# Patient Record
Sex: Female | Born: 1955
Health system: Southern US, Community
[De-identification: ages and names within clinical notes are randomized; demographics above are authoritative.]

## PROBLEM LIST (undated history)

## (undated) DIAGNOSIS — K219 Gastro-esophageal reflux disease without esophagitis: Secondary | ICD-10-CM

## (undated) DIAGNOSIS — R002 Palpitations: Secondary | ICD-10-CM

## (undated) DIAGNOSIS — I509 Heart failure, unspecified: Secondary | ICD-10-CM

## (undated) DIAGNOSIS — M810 Age-related osteoporosis without current pathological fracture: Secondary | ICD-10-CM

## (undated) HISTORY — DX: Gastro-esophageal reflux disease without esophagitis: K21.9

## (undated) HISTORY — DX: Palpitations: R00.2

## (undated) HISTORY — PX: VAGINAL HYSTERECTOMY: SUR661

## (undated) HISTORY — DX: Age-related osteoporosis without current pathological fracture: M81.0

---

## 1999-08-04 ENCOUNTER — Other Ambulatory Visit: Admission: RE | Admit: 1999-08-04 | Discharge: 1999-08-04 | Payer: Self-pay | Admitting: Gynecology

## 2000-07-12 ENCOUNTER — Other Ambulatory Visit: Admission: RE | Admit: 2000-07-12 | Discharge: 2000-07-12 | Payer: Self-pay | Admitting: Gynecology

## 2001-03-06 ENCOUNTER — Encounter: Payer: Self-pay | Admitting: Gynecology

## 2001-03-06 ENCOUNTER — Encounter: Admission: RE | Admit: 2001-03-06 | Discharge: 2001-03-06 | Payer: Self-pay | Admitting: Gynecology

## 2002-10-17 ENCOUNTER — Other Ambulatory Visit: Admission: RE | Admit: 2002-10-17 | Discharge: 2002-10-17 | Payer: Self-pay | Admitting: Gynecology

## 2004-03-30 ENCOUNTER — Other Ambulatory Visit: Admission: RE | Admit: 2004-03-30 | Discharge: 2004-03-30 | Payer: Self-pay | Admitting: Gynecology

## 2005-08-02 ENCOUNTER — Other Ambulatory Visit: Admission: RE | Admit: 2005-08-02 | Discharge: 2005-08-02 | Payer: Self-pay | Admitting: Gynecology

## 2008-08-01 ENCOUNTER — Encounter: Admission: RE | Admit: 2008-08-01 | Discharge: 2008-08-01 | Payer: Self-pay | Admitting: Gynecology

## 2008-08-09 ENCOUNTER — Encounter: Admission: RE | Admit: 2008-08-09 | Discharge: 2008-08-09 | Payer: Self-pay | Admitting: Gastroenterology

## 2010-02-16 ENCOUNTER — Encounter: Admission: RE | Admit: 2010-02-16 | Discharge: 2010-02-16 | Payer: Self-pay | Admitting: Gynecology

## 2012-01-13 ENCOUNTER — Other Ambulatory Visit: Payer: Self-pay | Admitting: Gynecology

## 2012-01-13 DIAGNOSIS — Z1231 Encounter for screening mammogram for malignant neoplasm of breast: Secondary | ICD-10-CM

## 2012-02-14 ENCOUNTER — Other Ambulatory Visit: Payer: Self-pay | Admitting: Gynecology

## 2012-02-14 ENCOUNTER — Ambulatory Visit
Admission: RE | Admit: 2012-02-14 | Discharge: 2012-02-14 | Disposition: A | Discharge status: Home / Self Care | Payer: Managed Care, Other (non HMO) | Source: Ambulatory Visit | Attending: Gynecology | Admitting: Gynecology

## 2012-02-14 DIAGNOSIS — M810 Age-related osteoporosis without current pathological fracture: Secondary | ICD-10-CM

## 2012-02-14 DIAGNOSIS — Z1231 Encounter for screening mammogram for malignant neoplasm of breast: Secondary | ICD-10-CM

## 2013-01-19 ENCOUNTER — Other Ambulatory Visit: Payer: Self-pay

## 2013-01-19 DIAGNOSIS — Z1231 Encounter for screening mammogram for malignant neoplasm of breast: Secondary | ICD-10-CM

## 2013-02-16 ENCOUNTER — Ambulatory Visit
Admission: RE | Admit: 2013-02-16 | Discharge: 2013-02-16 | Disposition: A | Discharge status: Home / Self Care | Payer: Managed Care, Other (non HMO) | Source: Ambulatory Visit

## 2013-02-16 DIAGNOSIS — Z1231 Encounter for screening mammogram for malignant neoplasm of breast: Secondary | ICD-10-CM

## 2013-05-17 ENCOUNTER — Telehealth: Payer: Self-pay | Admitting: Cardiology

## 2013-05-17 NOTE — Telephone Encounter (Signed)
ROI faxed to [facility] [phone]

## 2013-05-18 ENCOUNTER — Telehealth: Payer: Self-pay | Admitting: Cardiology

## 2013-05-18 NOTE — Telephone Encounter (Signed)
Letter Head received back From [facility] No records On This Pt 2.6.15/kdm

## 2013-06-05 ENCOUNTER — Ambulatory Visit: Payer: Managed Care, Other (non HMO) | Admitting: Cardiology

## 2013-07-03 ENCOUNTER — Encounter: Payer: Self-pay | Admitting: *Deleted

## 2013-07-03 ENCOUNTER — Ambulatory Visit (INDEPENDENT_AMBULATORY_CARE_PROVIDER_SITE_OTHER): Payer: Managed Care, Other (non HMO) | Admitting: Cardiology

## 2013-07-03 ENCOUNTER — Encounter: Payer: Self-pay | Admitting: Cardiology

## 2013-07-03 ENCOUNTER — Encounter (INDEPENDENT_AMBULATORY_CARE_PROVIDER_SITE_OTHER): Payer: Managed Care, Other (non HMO)

## 2013-07-03 VITALS — BP 156/83 | Ht 65.0 in | Wt 142.0 lb

## 2013-07-03 DIAGNOSIS — E041 Nontoxic single thyroid nodule: Secondary | ICD-10-CM

## 2013-07-03 DIAGNOSIS — R002 Palpitations: Secondary | ICD-10-CM

## 2013-07-03 DIAGNOSIS — R42 Dizziness and giddiness: Secondary | ICD-10-CM

## 2013-07-03 DIAGNOSIS — I10 Essential (primary) hypertension: Secondary | ICD-10-CM

## 2013-07-03 DIAGNOSIS — G4730 Sleep apnea, unspecified: Secondary | ICD-10-CM

## 2013-07-03 LAB — CBC WITH DIFFERENTIAL/PLATELET
Basophils Absolute: 0 10*3/uL (ref 0.0–0.1)
Basophils Relative: 0.4 % (ref 0.0–3.0)
Eosinophils Absolute: 0.1 10*3/uL (ref 0.0–0.7)
Eosinophils Relative: 1.2 % (ref 0.0–5.0)
HCT: 37.2 % (ref 36.0–46.0)
Hemoglobin: 12.4 g/dL (ref 12.0–15.0)
Lymphocytes Relative: 34.3 % (ref 12.0–46.0)
Lymphs Abs: 2.1 10*3/uL (ref 0.7–4.0)
MCHC: 33.3 g/dL (ref 30.0–36.0)
MCV: 93.6 fl (ref 78.0–100.0)
Monocytes Absolute: 0.4 10*3/uL (ref 0.1–1.0)
Monocytes Relative: 6.8 % (ref 3.0–12.0)
Neutro Abs: 3.5 10*3/uL (ref 1.4–7.7)
Neutrophils Relative %: 57.3 % (ref 43.0–77.0)
Platelets: 263 10*3/uL (ref 150.0–400.0)
RBC: 3.97 Mil/uL (ref 3.87–5.11)
RDW: 13 % (ref 11.5–14.6)
WBC: 6.2 10*3/uL (ref 4.5–10.5)

## 2013-07-03 LAB — COMPREHENSIVE METABOLIC PANEL
ALT: 14 U/L (ref 0–35)
AST: 16 U/L (ref 0–37)
Albumin: 3.9 g/dL (ref 3.5–5.2)
Alkaline Phosphatase: 57 U/L (ref 39–117)
BUN: 16 mg/dL (ref 6–23)
CO2: 31 mEq/L (ref 19–32)
Calcium: 9.1 mg/dL (ref 8.4–10.5)
Chloride: 103 mEq/L (ref 96–112)
Creatinine, Ser: 0.9 mg/dL (ref 0.4–1.2)
GFR: 72.72 mL/min (ref >60.00)
Glucose, Bld: 93 mg/dL (ref 70–99)
Potassium: 4.4 mEq/L (ref 3.5–5.1)
Sodium: 139 mEq/L (ref 135–145)
Total Bilirubin: 0.6 mg/dL (ref 0.3–1.2)
Total Protein: 7.6 g/dL (ref 6.0–8.3)

## 2013-07-03 LAB — TSH: TSH: 0.81 u[IU]/mL (ref 0.35–5.50)

## 2013-07-03 NOTE — Progress Notes (Signed)
Patient ID: [name], female   DOB: Mar 28, 1956, 58 y.o.   MRN: [id number]    Patient Name: [name] Date of Encounter: 07/03/2013  Primary Care Provider:  No primary provider on file. Primary Cardiologist:  Lars Masson  Problem List   Past Medical History  Diagnosis Date  . Osteoporosis    Past Surgical History  Procedure Laterality Date  . Vaginal hysterectomy     Allergies  Allergies  Allergen Reactions  . Cefdinir    HPI  58 year old female with h/o gastric reflux and palpitations and significant family history of coronary artery disease was coming with concerns of shortness of breath and palpitations. The patient states that her palpitations started years ago and she underwent Holter monitoring that showed frequent arrhythmia that she cannot specify further but at the time it was recommended that no treatment is necessary. She states that over time her palpitations got worse and are happening almost daily especially when laying in bed and on her left side makes her feel short of breath with improvement when laying on her back. The patient states that one she stopped eating chocolate and fiber bars her symptoms have improved but are still persistent. She states that they last seconds to minutes and are not associated with dizziness.  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 She walks on a regular bases with her husband up to 30 minutes a day however she states at moderate exertion she gets short of breath. She states that that's the way it has been for her and she was never able to run or do any moderate exercise activity.  She has significant family history of coronary artery disease her mother died of massive myocardial infarction at age of 15, there are multiple other family members with CAD. Patient states that her lipids are  followed by her primary care physician and are at goal. Her husband states that she has apneic episodes at night if she snores significantly. She also had a finding of a thyroid nodule 5 years ago that was advised to be followed.  Home Medications  Prior to Admission medications   Medication Sig Start Date End Date Taking? Authorizing Provider  ibandronate (BONIVA) 150 MG tablet  06/07/13  Yes Historical Provider, MD  omeprazole (PRILOSEC) 20 MG capsule Take 20 mg by mouth daily.   Yes Historical Provider, MD    Family History  No family history on file.  Social History  History   Social History  . Marital Status: Married    Spouse Name: N/A    Number of Children: N/A  . Years of Education: N/A   Occupational History  . Not on file.   Social History Main Topics  . Smoking status: Never Smoker   . Smokeless tobacco: Not on file  . Alcohol Use: No  . Drug Use: No  . Sexual Activity: Not on file   Other Topics Concern  . Not on file   Social History Narrative  . No narrative on file     Review of Systems, as per HPI, otherwise negative General:  No chills, fever, night sweats or weight changes.  Cardiovascular:  No chest pain, dyspnea on exertion, edema, orthopnea, palpitations, paroxysmal nocturnal dyspnea. Dermatological: No rash, lesions/masses Respiratory: No cough, dyspnea Urologic: No hematuria, dysuria Abdominal:   No nausea, vomiting, diarrhea,  bright red blood per rectum, melena, or hematemesis Neurologic:  No visual changes, wkns, changes in mental status. All other systems reviewed and are otherwise negative except as noted above.  Physical Exam  Blood pressure 156/83, height 5\' 5"  (1.651 m), weight 142 lb (64.411 kg).  General: Pleasant, NAD Psych: Normal affect. Neuro: Alert and oriented X 3. Moves all extremities spontaneously. HEENT: Normal  Neck: Supple without bruits or JVD. Lungs:  Resp regular and unlabored, CTA. Heart: RRR no s3, s4, or  murmurs. Abdomen: Soft, non-tender, non-distended, BS + x 4.  Extremities: No clubbing, cyanosis or edema. DP/PT/Radials 2+ and equal bilaterally.  Labs:  No results found for this basename: CKTOTAL, CKMB, TROPONINI,  in the last 72 hours No results found for this basename: WBC, HGB, HCT, MCV, PLT   No results found for this basename: NA, K, CL, CO2, BUN, CREATININE, CALCIUM, LABALBU, PROT, BILITOT, ALKPHOS, ALT, AST, GLUCOSE,  in the last 168 hours No results found for this basename: CHOL, HDL, LDLCALC, TRIG   No results found for this basename: DDIMER   No components found with this basename: POCBNP,   Accessory Clinical Findings  Echocardiogram - none  ECG - normal sinus rhythm, 65 beats per minute normal EKG.   Assessment & Plan  Very pleasant 58 year old female  1. Exertional shortness of breath, significant family history of coronary artery disease - normal EKG, we'll order an exercise treadmill stress test and will answer multiple questions most importantly you rule out ischemia. It will also show us if her functional capacity and symptoms during exercise. We will be also looking for arrhythmias. As well as blood pressure at rest and response to stress.  2. Palpitations - we'll start 48 hour Holter monitor  3. Sleep apnea - might be cause of her palpitations we will refer for sleep study  4.  Hypertension - uncontrolled however patient states that usually controlled we will follow after the stress test.  5. history of thyroid nodule - we will order thyroid ultrasound  Labs today CBC, CMP, TSH  Followup in one month.    Lars MassonNELSON, Katalyna Socarras H, MD, [facility] 07/03/2013, 1:17 PM

## 2013-07-03 NOTE — Progress Notes (Signed)
Patient ID: [name], female   DOB: Dec 07, 1955, 58 y.o.   MRN: [id number] E-Cardio 48 hour holter monitor applied to patient.

## 2013-07-03 NOTE — Patient Instructions (Addendum)
**Note De-Identified [name] Obfuscation** Your physician recommends that you have a Thyroid Ultrasound.  Your physician has recommended that you have a sleep study. This test records several body functions during sleep, including: brain activity, eye movement, oxygen and carbon dioxide blood levels, heart rate and rhythm, breathing rate and rhythm, the flow of air through your mouth and nose, snoring, body muscle movements, and chest and belly movement.  Your physician has recommended that you wear a 48 hour holter monitor. Holter monitors are medical devices that record the heart's electrical activity. Doctors most often use these monitors to diagnose arrhythmias. Arrhythmias are problems with the speed or rhythm of the heartbeat. The monitor is a small, portable device. You can wear one while you do your normal daily activities. This is usually used to diagnose what is causing palpitations/syncope (passing out).  Your physician has requested that you have an echocardiogram. Echocardiography is a painless test that uses sound waves to create images of your heart. It provides your doctor with information about the size and shape of your heart and how well your heart's chambers and valves are working. This procedure takes approximately one hour. There are no restrictions for this procedure.  Your physician has requested that you have an exercise tolerance test. For further information please visit [email or web address] Please also follow instruction sheet, as given.  Your physician recommends that you return for lab work in: today  Your physician recommends that you schedule a follow-up appointment in: after tests.

## 2013-07-06 ENCOUNTER — Ambulatory Visit (HOSPITAL_COMMUNITY)
Admission: RE | Admit: 2013-07-06 | Discharge: 2013-07-06 | Disposition: A | Discharge status: Home / Self Care | Payer: Managed Care, Other (non HMO) | Source: Ambulatory Visit | Attending: Cardiology | Admitting: Cardiology

## 2013-07-06 DIAGNOSIS — E041 Nontoxic single thyroid nodule: Secondary | ICD-10-CM

## 2013-07-12 ENCOUNTER — Telehealth: Payer: Self-pay | Admitting: Cardiology

## 2013-07-12 ENCOUNTER — Other Ambulatory Visit: Payer: Self-pay

## 2013-07-12 DIAGNOSIS — E041 Nontoxic single thyroid nodule: Secondary | ICD-10-CM

## 2013-07-12 NOTE — Telephone Encounter (Signed)
New message    Want lab results.   OK to leave on vm

## 2013-07-12 NOTE — Telephone Encounter (Signed)
The pts husband is advised of the pts normal lab results, he verbalized understanding and states that he will notify the pt of her results.

## 2013-07-17 ENCOUNTER — Telehealth: Payer: Self-pay

## 2013-07-17 NOTE — Telephone Encounter (Signed)
**Note De-Identified [name] Obfuscation** Per Dr Delton See the pt is given her normal Holter monitor results, the pt verbalized understanding.

## 2013-07-25 ENCOUNTER — Ambulatory Visit (INDEPENDENT_AMBULATORY_CARE_PROVIDER_SITE_OTHER): Payer: Managed Care, Other (non HMO) | Admitting: Endocrinology

## 2013-07-25 ENCOUNTER — Encounter: Payer: Self-pay | Admitting: Endocrinology

## 2013-07-25 VITALS — BP 126/84 | HR 85 | Temp 98.1°F | Resp 14 | Ht 64.0 in | Wt 144.2 lb

## 2013-07-25 DIAGNOSIS — E042 Nontoxic multinodular goiter: Secondary | ICD-10-CM

## 2013-07-25 NOTE — Progress Notes (Signed)
Patient ID: [name], female   DOB: 04-04-1956, 58 y.o.   MRN: [id number]    Reason for Appointment: Thyroid nodules, new consultation   History of Present Illness:   [name] thyroid enlargement was first discovered in 2011 At the time the patient had a CT scan of her neck incidentally for other reasons and was found to have thyroid nodules She had an ultrasound done which showed subcentimeter thyroid nodules which were not evaluated further There is no history of childhood radiation to her neck  Recently her cardiologist was concerned about her history of palpitations and was evaluating her thyroid again She was sent for a followup ultrasound which showed the following: 1.3 x 0.8 x 0.9 cm complex cyst in the upper pole of the right lobe is slightly larger.  Small hypoechoic nodule in the lower pole of the right lobe measures 7 x 3 x 6 mm. Previously, it measured 6 x 3 x 5 mm The right lobe showed miniscule thyroid nodule also  She has had no difficulty with swallowing  Does not feel like she has any discomfort or choking sensation in her neck  Apart from palpitations she has had no symptoms of unusual weight loss or heat intolerance  Lab Results  Component Value Date   TSH 0.81 07/03/2013       Medication List       This list is accurate as of: 07/25/13  4:25 PM.  Always use your most recent med list.               ibandronate 150 MG tablet  Commonly known as:  BONIVA     omeprazole 20 MG capsule  Commonly known as:  PRILOSEC  Take 20 mg by mouth daily.        Allergies:  Allergies  Allergen Reactions  . Cefdinir     Past Medical History  Diagnosis Date  . Osteoporosis     Past Surgical History  Procedure Laterality Date  . Vaginal hysterectomy      Family History  Problem Relation Age of Onset  . Diabetes Maternal Uncle   . Thyroid disease Neg Hx     Social History:  reports that she has never smoked. She does not have any smokeless  tobacco history on file. She reports that she does not drink alcohol or use illicit drugs.   Review of Systems:  There is no history of high blood pressure.             No  history of Diabetes.  She has had palpitations on and off since the year 2000 which had been more prominent recently. However she appears to have less symptoms with cutting back on chocolate and other foods   Currently is on Boniva and also Prilosec as her medications   Examination:   BP 126/84  Pulse 85  Temp(Src) 98.1 F (36.7 C)  Resp 14  Ht 5\' 4"  (1.626 m)  Wt 144 lb 3.2 oz (65.409 kg)  BMI 24.74 kg/m2  SpO2 98%   General Appearance: pleasant, average built and nourished         Eyes: No abnormal prominence or eyelid swelling.          Neck: The thyroid is not enlarged Neurological: REFLEXES: at biceps are normal.  Skin: Appears normal   Assessment/Plan:  Multinodular goiter with subcentimeter thyroid nodules showing minimal increase in size since 4 years ago She is euthyroid with normal TSH Discussed with the  patient today, and incidence of subcentimeter nodules in am and and that they are usually of no significance Since she has had no significant change in size over the last 4 years and has no new nodule she does not need any further evaluation or followup unless she has a clinically palpable nodule or local discomfort Patient and her husband understood the discussion and were satisfied with the information given given. She had had no further questions She will be seen in followup as needed only  Reather Littler 07/25/2013

## 2013-07-31 ENCOUNTER — Encounter: Payer: Self-pay | Admitting: Cardiology

## 2013-07-31 ENCOUNTER — Ambulatory Visit (HOSPITAL_COMMUNITY): Payer: Managed Care, Other (non HMO) | Attending: Cardiology | Admitting: Cardiology

## 2013-07-31 ENCOUNTER — Other Ambulatory Visit: Payer: Self-pay | Admitting: *Deleted

## 2013-07-31 ENCOUNTER — Ambulatory Visit (INDEPENDENT_AMBULATORY_CARE_PROVIDER_SITE_OTHER): Payer: Managed Care, Other (non HMO) | Admitting: Nurse Practitioner

## 2013-07-31 ENCOUNTER — Encounter: Payer: Self-pay | Admitting: Nurse Practitioner

## 2013-07-31 VITALS — BP 153/82 | HR 89

## 2013-07-31 DIAGNOSIS — R42 Dizziness and giddiness: Secondary | ICD-10-CM

## 2013-07-31 DIAGNOSIS — R0609 Other forms of dyspnea: Secondary | ICD-10-CM | POA: Insufficient documentation

## 2013-07-31 DIAGNOSIS — R0989 Other specified symptoms and signs involving the circulatory and respiratory systems: Secondary | ICD-10-CM

## 2013-07-31 DIAGNOSIS — R0602 Shortness of breath: Secondary | ICD-10-CM

## 2013-07-31 DIAGNOSIS — R002 Palpitations: Secondary | ICD-10-CM

## 2013-07-31 DIAGNOSIS — I10 Essential (primary) hypertension: Secondary | ICD-10-CM

## 2013-07-31 NOTE — Progress Notes (Signed)
Exercise Treadmill Test  Pre-Exercise Testing Evaluation Rhythm: normal sinus  Rate: 68 bpm     Test  Exercise Tolerance Test Ordering MD: Tobias Alexander, MD  Interpreting MD: Burnard Hawthorne, NP  Unique Test No: 1  Treadmill:  1  Indication for ETT: exertional dyspnea  Contraindication to ETT: No   Stress Modality: exercise - treadmill  Cardiac Imaging Performed: non   Protocol: standard Bruce - maximal  Max BP:  187/86  Max MPHR (bpm):  161 85% MPR (bpm):  137  MPHR obtained (bpm):  166 % MPHR obtained:  103%  Reached 85% MPHR (min:sec):  6:07 Total Exercise Time (min-sec):  7:27  Workload in METS:  9.2 Borg Scale: 15  Reason ETT Terminated:  multifocal PVC's     ST Segment Analysis At Rest: normal ST segments - no evidence of significant ST depression With Exercise: non-specific ST changes  Other Information Arrhythmia:  Yes Angina during ETT:  absent (0) Quality of ETT:  diagnostic  ETT Interpretation:  normal - no evidence of ischemia by ST analysis  Comments: Patient presents today for routine GXT. Has had palpitations, SOB and positive FH for CAD - has had echo earlier today - results pending. Notes that with stopping chocolate her symptoms have improved.   Today the patient exercised on the standard Bruce protocol for a total of 7:27 minutes.  Fair exercise tolerance.  Adequate blood pressure response.  Clinically negative for chest pain. Test was stopped due to bigeminy PVCs, triplet and short burst of SVT.  EKG negative for ischemia. No significant arrhythmia noted.   Recommendations: CV risk factor modification  See what her echo shows.  Needs to keep her follow up with Dr. Delton See  Her study was reviewed with Dr. Patty Sermons here today (DOD)  Patient is agreeable to this plan and will call if any problems develop in the interim.   Rosalio Macadamia, RN, ANP-C [facility] [address] [place], [place]  [address] [phone]

## 2013-07-31 NOTE — Progress Notes (Signed)
Echo performed. 

## 2013-08-02 ENCOUNTER — Telehealth: Payer: Self-pay | Admitting: Cardiology

## 2013-08-02 NOTE — Telephone Encounter (Signed)
Charmaine, I received this email and not sure how to take care of this.  Could you advise? Thank you, K

## 2013-08-02 NOTE — Telephone Encounter (Signed)
New message          [employer] is calling for pre-authorization for a sleep study.

## 2013-08-02 NOTE — Telephone Encounter (Signed)
**Note De-identified [name] Obfuscation** Please advise 

## 2013-08-03 NOTE — Telephone Encounter (Signed)
Follow up     Clarify orders received on sleep study.  Pt is not on CPAP machine and they need to clarify what doctor want

## 2013-08-03 NOTE — Telephone Encounter (Signed)
**Note De-Identified Delane Stalling Obfuscation** [name] is advised that this pt does need a sleep study per Dr Delton See. [name] states that the code on order was incorrect and he has corrected it.

## 2013-08-06 ENCOUNTER — Telehealth: Payer: Self-pay | Admitting: Cardiology

## 2013-08-06 MED ORDER — LISINOPRIL 5 MG PO TABS: 5.0000 mg | ORAL_TABLET | Freq: Every day | ORAL | Status: DC

## 2013-08-06 NOTE — Telephone Encounter (Signed)
Pt notified that Lisinopril 5 mg was sent to her pharmacy of choice.

## 2013-08-06 NOTE — Telephone Encounter (Signed)
New message         Pt wants to know if the sleep study is covered under her insurance.

## 2013-08-06 NOTE — Telephone Encounter (Signed)
Message copied by Loa Socks on Mon Aug 06, 2013  5:38 PM ------      Message from: Lars Masson      Created: Mon Aug 06, 2013  5:14 PM       I talked to her and I need you to call in a prescription for lisinopril 5 mg po daily.       Thank you,      K ------

## 2013-08-06 NOTE — Telephone Encounter (Signed)
New problem   Pt stated she called her insurance company and were going to deny her sleep study. Pt wanted you to know and pt will call and cancel sleep study and wait for you to get back with her.

## 2013-08-06 NOTE — Telephone Encounter (Addendum)
**Note De-Identified Willine Schwalbe Obfuscation** I left a detailed message on the pts VM that we have sent all of the information concerning her sleep study to her insurance company and have not heard back from them yet. Per Charmaine the pt is advised that she will need to contact her insurance company herself to see if test is covered.

## 2013-08-07 ENCOUNTER — Telehealth: Payer: Self-pay | Admitting: Cardiology

## 2013-08-07 ENCOUNTER — Encounter (HOSPITAL_BASED_OUTPATIENT_CLINIC_OR_DEPARTMENT_OTHER): Payer: Managed Care, Other (non HMO)

## 2013-08-07 DIAGNOSIS — I429 Cardiomyopathy, unspecified: Secondary | ICD-10-CM

## 2013-08-07 NOTE — Telephone Encounter (Signed)
LMTCB. Dr Delton See does not think its necessary for pt to come into office today.  Dr Delton See did say she will personally call pt today to help alleviate any concerns she has.  Will continue to follow up with the pt.

## 2013-08-07 NOTE — Telephone Encounter (Signed)
Pt contacted me and said she was ok with not coming into office today to ask further questions. Pt did ask if it would be ok for Dr Delton See to contact her after 5 pm to ask some questions. Told pt I would make Dr Delton See aware of this.

## 2013-08-07 NOTE — Telephone Encounter (Signed)
New Message:  Pt has questions from her conversation yesterday with a staff member

## 2013-08-07 NOTE — Telephone Encounter (Signed)
Spoke with pt about new order per Dr Delton See to order a exercise stress myoview for new diagnosis for cardiomyopathy.  Pt states that she can be contacted tomorrow 08/08/13 at anytime after 10 am to have this scheduled. Told pt I will notify our schedulers about this.  Also told pt that I called her Lisinopril 5 mg to her pharmacy [facility].  Pt verbalized understanding of new orders and pleased with prompt follow-up.

## 2013-08-07 NOTE — Telephone Encounter (Signed)
Follow up  ° ° ° °Returning call back to nurse  °

## 2013-08-07 NOTE — Telephone Encounter (Signed)
Pt called this morning about conversation her and Dr Delton See had yesterday.  Pt seems very anxious about information given to her.  Pt requesting to come into the office today to speak personally with Dr Delton See. Told pt I will inform Dr. Delton See of this request and call her back with an answer.  Pt okay with this.

## 2013-08-17 ENCOUNTER — Ambulatory Visit: Payer: Managed Care, Other (non HMO) | Admitting: Cardiology

## 2013-08-23 ENCOUNTER — Ambulatory Visit (HOSPITAL_COMMUNITY): Payer: Managed Care, Other (non HMO) | Attending: Cardiovascular Disease | Admitting: Radiology

## 2013-08-23 VITALS — BP 136/80 | Ht 64.0 in | Wt 140.0 lb

## 2013-08-23 DIAGNOSIS — R0602 Shortness of breath: Secondary | ICD-10-CM

## 2013-08-23 DIAGNOSIS — I4949 Other premature depolarization: Secondary | ICD-10-CM

## 2013-08-23 DIAGNOSIS — I429 Cardiomyopathy, unspecified: Secondary | ICD-10-CM

## 2013-08-23 DIAGNOSIS — I428 Other cardiomyopathies: Secondary | ICD-10-CM | POA: Insufficient documentation

## 2013-08-23 DIAGNOSIS — I491 Atrial premature depolarization: Secondary | ICD-10-CM

## 2013-08-23 MED ORDER — TECHNETIUM TC 99M SESTAMIBI GENERIC - CARDIOLITE
33.0000 | Freq: Once | INTRAVENOUS | Status: AC | PRN
  Administered 2013-08-23: 33 via INTRAVENOUS

## 2013-08-23 MED ORDER — TECHNETIUM TC 99M SESTAMIBI GENERIC - CARDIOLITE
11.0000 | Freq: Once | INTRAVENOUS | Status: AC | PRN
  Administered 2013-08-23: 11 via INTRAVENOUS

## 2013-08-23 NOTE — Progress Notes (Signed)
[facility] SITE 3 NUCLEAR MED [address] [place], [place] [address] [phone]    Cardiology Nuclear Med Study  [name] is a 58 y.o. female     MRN : [id number]     DOB: January 17, 1956  Procedure Date: 08/23/2013  Nuclear Med Background Indication for Stress Test:  Evaluation for Ischemia History:  07/31/13 ECHO: EF: 30-35%- GXT: Non Specific ST, T changes, and SVT, No H/O  CAD Cardiac Risk Factors: Family History - CAD and Hypertension  Symptoms:  Palpitations and SOB   Nuclear Pre-Procedure Caffeine/Decaff Intake:  None > 12 hrs NPO After: 6:00pm   Lungs:  clear O2 Sat: 97% on room air. IV 0.9% NS with Angio Cath:  22g  IV Site: R Antecubital x 1, tolerated well IV Started by:  Irean Hong, RN  Chest Size (in):  34 Cup Size: C  Height: 5\' 4"  (1.626 m)  Weight:  140 lb (63.504 kg)  BMI:  Body mass index is 24.02 kg/(m^2). Tech Comments:  Lisinopril yesterday at 1:30pm. Irean Hong, RN.    Nuclear Med Study 1 or 2 day study: 1 day  Stress Test Type:  Stress  Reading MD: N/A  Order Authorizing Provider:  Tobias Alexander, MD  Resting Radionuclide: Technetium 52m Sestamibi  Resting Radionuclide Dose: 11.0 mCi   Stress Radionuclide:  Technetium 107m Sestamibi  Stress Radionuclide Dose: 33.0 mCi           Stress Protocol Rest HR: 62 Stress HR: 160  Rest BP: 136/80 Stress BP: 168/76  Exercise Time (min): 7:30 METS: 9.30   Predicted Max HR: 161 bpm % Max HR: 99.38 bpm Rate Pressure Product: 59563   Dose of Adenosine (mg):  n/a Dose of Lexiscan: n/a mg  Dose of Atropine (mg): n/a Dose of Dobutamine: n/a mcg/kg/min (at max HR)  Stress Test Technologist: Milana Na, EMT-P  Nuclear Technologist:  Domenic Polite, CNMT     Rest Procedure:  Myocardial perfusion imaging was performed at rest 45 minutes following the intravenous administration of Technetium 63m Sestamibi. Rest ECG: Sinus bradycardia rate 57 with poor R wave progression, T-wave  inversion in leads V2, V3.  Stress Procedure:  The patient exercised on the treadmill utilizing the Bruce Protocol for 7:30 minutes. The patient stopped due to sob and denied any chest pain.  Technetium 34m Sestamibi was injected at peak exercise and myocardial perfusion imaging was performed after a brief delay. Stress ECG: No ST segment deviation however there was pseudonormalization of T wave inversion, occasional PVCs, PACs  QPS Raw Data Images:  Mild breast attenuation; normal left ventricular size. Stress Images:  There is basal inferior wall decreased uptake seen at both rest and stress, likely secondary to bowel loop attenuation. Worse at rest than at stress. Mild apical thinning at both rest and stress. No significant ischemia identified. Rest Images:  As above Subtraction (SDS):  No evidence of ischemia. Transient Ischemic Dilatation (Normal <1.22):  1.05 Lung/Heart Ratio (Normal <0.45):  0.32  Quantitative Gated Spect Images QGS EDV:  n/a ml QGS ESV:  n/a ml  Impression Exercise Capacity:  Good exercise capacity. BP Response:  Normal blood pressure response. Clinical Symptoms:  There is dyspnea. ECG Impression:  No significant ST segment change suggestive of ischemia. Comparison with Prior Nuclear Study: No images to compare  Overall Impression:  Low risk stress nuclear study with no significant areas of ischemia identified..  LV Ejection Fraction: Study not gated.    Donato Schultz, MD

## 2013-09-10 ENCOUNTER — Encounter: Payer: Self-pay | Admitting: Cardiology

## 2013-09-10 ENCOUNTER — Ambulatory Visit (INDEPENDENT_AMBULATORY_CARE_PROVIDER_SITE_OTHER): Payer: Managed Care, Other (non HMO) | Admitting: Cardiology

## 2013-09-10 VITALS — BP 134/77 | HR 58 | Ht 64.0 in | Wt 141.0 lb

## 2013-09-10 DIAGNOSIS — R002 Palpitations: Secondary | ICD-10-CM

## 2013-09-10 DIAGNOSIS — R9430 Abnormal result of cardiovascular function study, unspecified: Secondary | ICD-10-CM

## 2013-09-10 DIAGNOSIS — R0602 Shortness of breath: Secondary | ICD-10-CM

## 2013-09-10 DIAGNOSIS — I5022 Chronic systolic (congestive) heart failure: Secondary | ICD-10-CM

## 2013-09-10 DIAGNOSIS — R0989 Other specified symptoms and signs involving the circulatory and respiratory systems: Secondary | ICD-10-CM

## 2013-09-10 DIAGNOSIS — I509 Heart failure, unspecified: Secondary | ICD-10-CM

## 2013-09-10 DIAGNOSIS — R931 Abnormal findings on diagnostic imaging of heart and coronary circulation: Secondary | ICD-10-CM

## 2013-09-10 DIAGNOSIS — I10 Essential (primary) hypertension: Secondary | ICD-10-CM

## 2013-09-10 NOTE — Patient Instructions (Addendum)
Your physician has requested that you have an echocardiogram. Echocardiography is a painless test that uses sound waves to create images of your heart. It provides your doctor with information about the size and shape of your heart and how well your heart's chambers and valves are working. This procedure takes approximately one hour. There are no restrictions for this procedure. EARLY July  Return tomorrow after 7:30 am for fasting labs  (lp/bmet/bnp)  Your physician recommends that you schedule a follow-up appointment in: in July for ov several days after Echo

## 2013-09-10 NOTE — Progress Notes (Signed)
Patient ID: [name], female   DOB: Nov 30, 1955, 58 y.o.   MRN: [id number]    Primary Care Provider: No primary provider on file.  Primary Cardiologist: Lars Masson  Problem List  Past Medical History   Diagnosis  Date   .  Osteoporosis     Past Surgical History   Procedure  Laterality  Date   .  Vaginal hysterectomy     Allergies  Allergies   Allergen  Reactions   .  Cefdinir     HPI   58 year old female with h/o gastric reflux and palpitations and significant family history of coronary artery disease was coming with concerns of shortness of breath and palpitations. The patient states that her palpitations started years ago and she underwent Holter monitoring that showed frequent arrhythmia that she cannot specify further but at the time it was recommended that no treatment is necessary. She states that over time her palpitations got worse and are happening almost daily especially when laying in bed and on her left side makes her feel short of breath with improvement when laying on her back. The patient states that one she stopped eating chocolate and fiber bars her symptoms have improved but are still persistent. She states that they last seconds to minutes and are not associated with dizziness.  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 She walks on a regular bases with her husband up to 30 minutes a day however she states at moderate exertion she gets short of breath. She states that that's the way it has been for her and she was never able to run or do any moderate exercise activity.  She has significant family history of coronary artery disease her mother died of massive myocardial infarction at age of 28, there are multiple other family members with CAD. Patient states that her lipids are followed by her primary care physician and are at goal. Her  husband states that she has apneic episodes at night if she snores significantly. She also had a finding of a thyroid nodule 5 years ago that was advised to be followed.   The patient underwent echocardiography that showed left ventricular ejection fraction 30-35% with preserved left ventricular size and diffuse hypokinesis. An exercise nuclear stress test showed no ischemia and patient was started on lisinopril 5 mg daily. The patient is tolerating the medication very well she denies any cough. In fact she feels that her energy level has increased and her shortness of breath has improved minimally. She is working a 12 hour shift and feels exhausted by the end of the day feeling profoundly short of breath.  Home Medications  Prior to Admission medications   Medication  Sig  Start Date  End Date  Taking?  Authorizing Provider   ibandronate (BONIVA) 150 MG tablet   06/07/13   Yes  Historical Provider, MD   omeprazole (PRILOSEC) 20 MG capsule  Take 20 mg by mouth daily.    Yes  Historical Provider, MD   Family History  No family history on file.  Social History  History    Social History   .  Marital Status:  Married     Spouse Name:  N/A     Number of Children:  N/A   .  Years of Education:  N/A    Occupational History   .  Not on file.    Social History Main Topics   .  Smoking status:  Never Smoker   .  Smokeless tobacco:  Not on file   .  Alcohol Use:  No   .  Drug Use:  No   .  Sexual Activity:  Not on file    Other Topics  Concern   .  Not on file    Social History Narrative   .  No narrative on file   Review of Systems, as per HPI, otherwise negative  General: No chills, fever, night sweats or weight changes.  Cardiovascular: No chest pain, dyspnea on exertion, edema, orthopnea, palpitations, paroxysmal nocturnal dyspnea.  Dermatological: No rash, lesions/masses  Respiratory: No cough, dyspnea  Urologic: No hematuria, dysuria  Abdominal: No nausea, vomiting, diarrhea,  bright red blood per rectum, melena, or hematemesis  Neurologic: No visual changes, wkns, changes in mental status.  All other systems reviewed and are otherwise negative except as noted above.  Physical Exam  Blood pressure 156/83, height 5\' 5"  (1.651 m), weight 142 lb (64.411 kg).  General: Pleasant, NAD  Psych: Normal affect.  Neuro: Alert and oriented X 3. Moves all extremities spontaneously.  HEENT: Normal  Neck: Supple without bruits or JVD.  Lungs: Resp regular and unlabored, CTA.  Heart: RRR no s3, s4, or murmurs.  Abdomen: Soft, non-tender, non-distended, BS + x 4.  Extremities: No clubbing, cyanosis or edema. DP/PT/Radials 2+ and equal bilaterally.  Labs:  No results found for this basename: CKTOTAL, CKMB, TROPONINI, in the last 72 hours  No results found for this basename: WBC, HGB, HCT, MCV, PLT   No results found for this basename: NA, K, CL, CO2, BUN, CREATININE, CALCIUM, LABALBU, PROT, BILITOT, ALKPHOS, ALT, AST, GLUCOSE, in the last 168 hours  No results found for this basename: CHOL, HDL, LDLCALC, TRIG    No results found for this basename: DDIMER   No components found with this basename: POCBNP,   Accessory Clinical Findings   Echocardiogram - 07/31/2013  - Left ventricle: The cavity size was normal. Wall thickness was normal. Systolic function was moderately to severely reduced. The estimated ejection fraction was in the range of 30% to 35%. Diffuse hypokinesis. Doppler parameters are consistent with abnormal left ventricular relaxation (grade 1 diastolic dysfunction). - Aortic valve: There was no stenosis. Trivial regurgitation. - Mitral valve: Mildly calcified annulus. Mildly calcified leaflets . Trivial regurgitation. - Left atrium: The atrium was mildly dilated. - Right ventricle: The cavity size was normal. Systolic function was normal. - Pulmonary arteries: No complete TR doppler jet so unable to estimate PA systolic pressure. - Inferior vena cava:  The vessel was normal in size; the respirophasic diameter changes were in the normal range (= 50%); findings are consistent with normal central venous pressure. Impressions:  - Normal LV size with global hypokinesis, EF 30-35%. Normal RV size and systolic function. No significant valvular abnormalities.   ECG - normal sinus rhythm, 65 beats per minute normal EKG.   Exercise nuclear stress test 08/23/2013  Impression  Exercise Capacity: Good exercise capacity.  BP Response: Normal blood pressure response.  Clinical Symptoms: There is dyspnea.  ECG Impression: No significant ST segment change suggestive of ischemia.  Comparison with Prior Nuclear Study: No images to compare  Overall Impression: Low risk stress nuclear study with no significant areas of ischemia identified..  LV Ejection Fraction: Study not gated.  Donato SchultzMark Skains, MD    Assessment & Plan   Very pleasant  58 year old female   1. new diagnosis of systolic CHF, ejection fraction 30-35%, the patient was started on lisinopril 5 mg daily, she felt slightly lightheaded in the mornings therefore we won't titrate to 10 mg daily today. We will recheck echocardiogram in 3 months from when he started to TURBT that will be early July. If left ventricular ejection fraction remains the same we will refer her for implantation of an ICD.  2. Palpitations -  48 hour Holter monitor showed only frequent PVCs and PACs.  3. Sleep apnea - might be cause of her palpitations we will refer for sleep study   4. Hypertension - controlled after starting lisinopril 5 mg daily.   5. history of thyroid nodule - we will order thyroid ultrasound   Check CMP, BNP and lipid profile today.  Followup in 2 months.   Lars Masson, MD, [facility]  09/10/2013

## 2013-09-11 ENCOUNTER — Other Ambulatory Visit (INDEPENDENT_AMBULATORY_CARE_PROVIDER_SITE_OTHER): Payer: Managed Care, Other (non HMO)

## 2013-09-11 DIAGNOSIS — R9430 Abnormal result of cardiovascular function study, unspecified: Secondary | ICD-10-CM

## 2013-09-11 DIAGNOSIS — I509 Heart failure, unspecified: Secondary | ICD-10-CM

## 2013-09-11 DIAGNOSIS — R0989 Other specified symptoms and signs involving the circulatory and respiratory systems: Secondary | ICD-10-CM

## 2013-09-11 DIAGNOSIS — R0602 Shortness of breath: Secondary | ICD-10-CM

## 2013-09-11 LAB — COMPREHENSIVE METABOLIC PANEL WITH GFR
ALT: 12 U/L (ref 0–35)
AST: 13 U/L (ref 0–37)
Albumin: 4 g/dL (ref 3.5–5.2)
Alkaline Phosphatase: 54 U/L (ref 39–117)
BUN: 14 mg/dL (ref 6–23)
CO2: 30 meq/L (ref 19–32)
Calcium: 9.1 mg/dL (ref 8.4–10.5)
Chloride: 105 meq/L (ref 96–112)
Creatinine, Ser: 0.8 mg/dL (ref 0.4–1.2)
GFR: 76.83 mL/min
Glucose, Bld: 89 mg/dL (ref 70–99)
Potassium: 3.9 meq/L (ref 3.5–5.1)
Sodium: 140 meq/L (ref 135–145)
Total Bilirubin: 0.9 mg/dL (ref 0.2–1.2)
Total Protein: 7.7 g/dL (ref 6.0–8.3)

## 2013-09-11 LAB — BRAIN NATRIURETIC PEPTIDE: Pro B Natriuretic peptide (BNP): 18 pg/mL (ref 0.0–100.0)

## 2013-09-11 LAB — LIPID PANEL
Cholesterol: 176 mg/dL (ref 0–200)
HDL: 62.2 mg/dL (ref >39.00)
LDL Cholesterol: 101 mg/dL — ABNORMAL HIGH (ref 0–99)
Total CHOL/HDL Ratio: 3
Triglycerides: 63 mg/dL (ref 0.0–149.0)
VLDL: 12.6 mg/dL (ref 0.0–40.0)

## 2013-09-12 ENCOUNTER — Telehealth: Payer: Self-pay | Admitting: *Deleted

## 2013-09-12 NOTE — Telephone Encounter (Signed)
Contacted pt about her normal labs and normal BNP (no signs of heart failure) per Dr Delton See.  Pt verbalized understanding and pleased with this news.

## 2013-09-12 NOTE — Telephone Encounter (Signed)
Message copied by Loa Socks on Wed Sep 12, 2013  1:01 PM ------      Message from: Lars Masson      Created: Wed Sep 12, 2013 10:46 AM       Please let her know she has normal labs and normal BNP (no signs of heart failure)      Thank you,      K ------

## 2013-10-04 ENCOUNTER — Encounter: Payer: Self-pay | Admitting: *Deleted

## 2013-10-08 ENCOUNTER — Ambulatory Visit (HOSPITAL_COMMUNITY): Payer: Managed Care, Other (non HMO) | Attending: Cardiology | Admitting: Radiology

## 2013-10-08 DIAGNOSIS — R0602 Shortness of breath: Secondary | ICD-10-CM

## 2013-10-08 DIAGNOSIS — R0989 Other specified symptoms and signs involving the circulatory and respiratory systems: Secondary | ICD-10-CM

## 2013-10-08 DIAGNOSIS — I5022 Chronic systolic (congestive) heart failure: Secondary | ICD-10-CM | POA: Insufficient documentation

## 2013-10-08 DIAGNOSIS — I509 Heart failure, unspecified: Secondary | ICD-10-CM | POA: Insufficient documentation

## 2013-10-08 DIAGNOSIS — R9430 Abnormal result of cardiovascular function study, unspecified: Secondary | ICD-10-CM

## 2013-10-08 NOTE — Progress Notes (Signed)
Echocardiogram performed.  

## 2013-10-11 ENCOUNTER — Ambulatory Visit (INDEPENDENT_AMBULATORY_CARE_PROVIDER_SITE_OTHER): Payer: Managed Care, Other (non HMO) | Admitting: Cardiology

## 2013-10-11 ENCOUNTER — Encounter: Payer: Self-pay | Admitting: *Deleted

## 2013-10-11 ENCOUNTER — Encounter: Payer: Self-pay | Admitting: Cardiology

## 2013-10-11 VITALS — BP 116/68 | HR 57 | Ht 64.0 in | Wt 141.0 lb

## 2013-10-11 DIAGNOSIS — R002 Palpitations: Secondary | ICD-10-CM

## 2013-10-11 DIAGNOSIS — I10 Essential (primary) hypertension: Secondary | ICD-10-CM

## 2013-10-11 DIAGNOSIS — I5022 Chronic systolic (congestive) heart failure: Secondary | ICD-10-CM

## 2013-10-11 DIAGNOSIS — I509 Heart failure, unspecified: Secondary | ICD-10-CM

## 2013-10-11 NOTE — Patient Instructions (Signed)
Your physician recommends that you continue on your current medications as directed. Please refer to the Current Medication list given to you today.   Your physician has requested that you have an echocardiogram. Echocardiography is a painless test that uses sound waves to create images of your heart. It provides your doctor with information about the size and shape of your heart and how well your heart's chambers and valves are working. This procedure takes approximately one hour. There are no restrictions for this procedure. THIS NEEDS TO BE SCHEDULED FOR THE END OF AUGUST   Your physician recommends that you schedule a follow-up appointment in: 3 MONTHS WITH DR Delton See

## 2013-10-11 NOTE — Progress Notes (Signed)
Patient ID: [name], female   DOB: 09/17/1955, 58 y.o.   MRN: [id number]    Primary Care Provider: No primary provider on file.  Primary Cardiologist: Lars MassonNELSON, Kunta Hilleary H  Problem List  Past Medical History   Diagnosis  Date   .  Osteoporosis     Past Surgical History   Procedure  Laterality  Date   .  Vaginal hysterectomy     Allergies  Allergies   Allergen  Reactions   .  Cefdinir     HPI   58 year old female with h/o gastric reflux and palpitations and significant family history of coronary artery disease was coming with concerns of shortness of breath and palpitations. The patient states that her palpitations started years ago and she underwent Holter monitoring that showed frequent arrhythmia that she cannot specify further but at the time it was recommended that no treatment is necessary. She states that over time her palpitations got worse and are happening almost daily especially when laying in bed and on her left side makes her feel short of breath with improvement when laying on her back. The patient states that one she stopped eating chocolate and fiber bars her symptoms have improved but are still persistent. She states that they last seconds to minutes and are not associated with dizziness.  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 She walks on a regular bases with her husband up to 30 minutes a day however she states at moderate exertion she gets short of breath. She states that that's the way it has been for her and she was never able to run or do any moderate exercise activity.  She has significant family history of coronary artery disease her mother died of massive myocardial infarction at age of 59, there are multiple other family members with CAD. Patient states that her lipids are followed by her primary care physician and are at goal. Her  husband states that she has apneic episodes at night if she snores significantly. She also had a finding of a thyroid nodule 5 years ago that was advised to be followed.   The patient underwent echocardiography that showed left ventricular ejection fraction 30-35% with preserved left ventricular size and diffuse hypokinesis. An exercise nuclear stress test showed no ischemia and patient was started on lisinopril 5 mg daily. The patient is tolerating the medication very well she denies any cough. In fact she feels that her energy level has increased and her shortness of breath has improved minimally. She is working a 12 hour shift and feels exhausted by the end of the day feeling profoundly short of breath.  10/11/2013 - the patient has some improvement of her fatigue mostly because she is out of work and enjoying life. She denies orthopnea, lower extremity edema, paroxysmal nocturnal dyspnea, shortness of breath or chest pain. She is compliant to her medications. She describes symptoms consistent with benign positional vertigo.  Home Medications  Prior to Admission medications   Medication  Sig  Start Date  End Date  Taking?  Authorizing Provider   ibandronate (BONIVA) 150 MG tablet   06/07/13   Yes  Historical Provider, MD   omeprazole (PRILOSEC) 20 MG capsule  Take 20 mg by mouth daily.    Yes  Historical Provider, MD   Family History  No family history on file.  Social History  History    Social History   .  Marital Status:  Married     Spouse Name:  N/A     Number of Children:  N/A   .  Years of Education:  N/A    Occupational History   .  Not on file.    Social History Main Topics   .  Smoking status:  Never Smoker   .  Smokeless tobacco:  Not on file   .  Alcohol Use:  No   .  Drug Use:  No   .  Sexual Activity:  Not on file    Other Topics  Concern   .  Not on file    Social History Narrative   .  No narrative on file   Review of Systems, as per HPI, otherwise negative    General: No chills, fever, night sweats or weight changes.  Cardiovascular: No chest pain, dyspnea on exertion, edema, orthopnea, palpitations, paroxysmal nocturnal dyspnea.  Dermatological: No rash, lesions/masses  Respiratory: No cough, dyspnea  Urologic: No hematuria, dysuria  Abdominal: No nausea, vomiting, diarrhea, bright red blood per rectum, melena, or hematemesis  Neurologic: No visual changes, wkns, changes in mental status.  All other systems reviewed and are otherwise negative except as noted above.  Physical Exam  Blood pressure 156/83, height 5\' 5"  (1.651 m), weight 142 lb (64.411 kg).  General: Pleasant, NAD  Psych: Normal affect.  Neuro: Alert and oriented X 3. Moves all extremities spontaneously.  HEENT: Normal  Neck: Supple without bruits or JVD.  Lungs: Resp regular and unlabored, CTA.  Heart: RRR no s3, s4, or murmurs.  Abdomen: Soft, non-tender, non-distended, BS + x 4.  Extremities: No clubbing, cyanosis or edema. DP/PT/Radials 2+ and equal bilaterally.  Labs:  No results found for this basename: CKTOTAL, CKMB, TROPONINI, in the last 72 hours  No results found for this basename: WBC, HGB, HCT, MCV, PLT   No results found for this basename: NA, K, CL, CO2, BUN, CREATININE, CALCIUM, LABALBU, PROT, BILITOT, ALKPHOS, ALT, AST, GLUCOSE, in the last 168 hours  No results found for this basename: CHOL, HDL, LDLCALC, TRIG    No results found for this basename: DDIMER   No components found with this basename: POCBNP,   Accessory Clinical Findings   Echocardiogram - 07/31/2013  - Left ventricle: The cavity size was normal. Wall thickness was normal. Systolic function was moderately to severely reduced. The estimated ejection fraction was in the range of 30% to 35%. Diffuse hypokinesis. Doppler parameters are consistent with abnormal left ventricular relaxation (grade 1 diastolic dysfunction). - Aortic valve: There was no stenosis. Trivial regurgitation. - Mitral  valve: Mildly calcified annulus. Mildly calcified leaflets . Trivial regurgitation. - Left atrium: The atrium was mildly dilated. - Right ventricle: The cavity size was normal. Systolic function was normal. - Pulmonary arteries: No complete TR doppler jet so unable to estimate PA systolic pressure. - Inferior vena cava: The vessel was normal in size; the respirophasic diameter changes were in the normal range (= 50%); findings are consistent with normal central venous pressure. Impressions:  - Normal LV size with global hypokinesis, EF 30-35%. Normal RV size and systolic function. No significant valvular abnormalities.   ECG - normal sinus rhythm, 65 beats per minute normal EKG.   Exercise nuclear stress test 08/23/2013  Impression  Exercise Capacity: Good exercise capacity.  BP Response: Normal blood pressure response.  Clinical Symptoms: There is dyspnea.  ECG Impression: No  significant ST segment change suggestive of ischemia.  Comparison with Prior Nuclear Study: No images to compare  Overall Impression: Low risk stress nuclear study with no significant areas of ischemia identified..  LV Ejection Fraction: Study not gated.  Donato Schultz, MD    Assessment & Plan   Very pleasant 58 year old female   1. new diagnosis of systolic CHF, ejection fraction 30-35%, the patient was started on lisinopril 5 mg daily, she felt slightly lightheaded in the mornings therefore we won't titrate to 10 mg daily today. We will recheck echocardiogram in 3 months again. If left ventricular ejection fraction remains the same we will refer her for implantation of an ICD.  A repeat echocardiogram performed 10/08/2013 shows minimal improvement overall LVEF being 40-45%. The patient is euvolemic today.  2. Palpitations -  48 hour Holter monitor showed only frequent PVCs and PACs.  3. Sleep apnea - might be cause of her palpitations we will refer for sleep study   4. Hypertension - controlled after  starting lisinopril 5 mg daily.   5. history of thyroid nodule - Complex cysts and small nodules in both lobes are noted. Within the  right lobe common complex cyst and small nodule are slightly larger  but remain be lobe criteria for fine needle aspiration biopsy.  Findings do not meet current SRU consensus criteria for biopsy.  6. Lipids - at goal and no evidence of coronary artery disease  Followup in 2 months with repeat echocardiogram.   Lars Masson, MD, [facility]  10/11/2013

## 2013-11-06 ENCOUNTER — Telehealth: Payer: Self-pay | Admitting: *Deleted

## 2013-11-06 NOTE — Telephone Encounter (Signed)
Walk-in patient with question related to her [employer] restrictions/activity status. Requesting to find out, "How long am I to stay on restricted work hours?"  She has an ECHO scheduled in August. Is she supposed to stay on work restrictions until after she has the ECHO or until next appointment to review ECHO findings?  Patient can be reached at: [phone].

## 2013-11-12 NOTE — Telephone Encounter (Signed)
Pt states she will speak to her HR lady tomorrow and get back to Dr Delton See and myself by Wednesday.  Pt states if HR lady advises her to have more paperwork filled out before her echo, she will drop this off by this Wednesday 8/5.  Pt states if all goes as planned, she would like to have short term disability paperwork completely after the upcoming echo is performed and resulted by Dr Delton See.  Will route this to Dr Delton See for her review.

## 2013-11-12 NOTE — Telephone Encounter (Signed)
Ivy, This patient called (see bellow). On the last outpatient visit she wanted me to write her letter stating that she can't work full time and can only carry limited weight. Based on this last phone call seems like she wants to work more, but I am not sure.  PLease ask her what she wants and we will write it for her. She has an echo scheduled for later this month, I might make my decision based on that. I tried to call but it went to voicemail. Aris Lot

## 2013-11-12 NOTE — Telephone Encounter (Signed)
LMTCB to clarify work restrictions needed.

## 2013-11-13 ENCOUNTER — Telehealth: Payer: Self-pay | Admitting: *Deleted

## 2013-11-13 NOTE — Telephone Encounter (Signed)
Pt is contacting Dr Delton See to request for her work restriction letter that was written on 7/2 to be updated.  Last letter stated the pt is to return to work with no more than 20 hrs a week and no more than 8 hrs a day.  Pt states that Rosann Auerbach is requiring Dr Delton See to write another letter reinforcing pts restrictions (hours per week and per day), and why the patient needs to be on these restrictions.  Pt states when letter is complete, she can come to the office to pick this up.  Informed pt that I will route this message to Dr Delton See for review and documentation and will follow-up with pt thereafter.  Pt verbalized understanding and agrees with this plan.

## 2013-11-14 ENCOUNTER — Encounter: Payer: Self-pay | Admitting: Cardiology

## 2013-12-07 ENCOUNTER — Ambulatory Visit (HOSPITAL_COMMUNITY): Payer: Managed Care, Other (non HMO) | Attending: Cardiology

## 2013-12-07 ENCOUNTER — Telehealth: Payer: Self-pay | Admitting: *Deleted

## 2013-12-07 DIAGNOSIS — Z8249 Family history of ischemic heart disease and other diseases of the circulatory system: Secondary | ICD-10-CM | POA: Insufficient documentation

## 2013-12-07 DIAGNOSIS — K219 Gastro-esophageal reflux disease without esophagitis: Secondary | ICD-10-CM | POA: Diagnosis not present

## 2013-12-07 DIAGNOSIS — R002 Palpitations: Secondary | ICD-10-CM | POA: Insufficient documentation

## 2013-12-07 DIAGNOSIS — I10 Essential (primary) hypertension: Secondary | ICD-10-CM | POA: Diagnosis not present

## 2013-12-07 DIAGNOSIS — I509 Heart failure, unspecified: Secondary | ICD-10-CM | POA: Insufficient documentation

## 2013-12-07 DIAGNOSIS — M810 Age-related osteoporosis without current pathological fracture: Secondary | ICD-10-CM | POA: Insufficient documentation

## 2013-12-07 DIAGNOSIS — I5022 Chronic systolic (congestive) heart failure: Secondary | ICD-10-CM

## 2013-12-07 MED ORDER — LISINOPRIL 10 MG PO TABS: 10.0000 mg | ORAL_TABLET | Freq: Every day | ORAL | Status: DC

## 2013-12-07 NOTE — Telephone Encounter (Signed)
Informed pt that per Dr Delton See her echo showed improved LVEF now 35-40%, previously 30-35%, per Dr Delton See.  Informed pt that Dr Delton See recommends the pt increase her lisinopril to 10 mg po daily.  Confirmed pharmacy of choice with pt.  Pt wants to ask Dr Delton See about her work restrictions, and if she could give current documentation or letter on pts current status and treatments received.  Also mentioned to pt that Dr Delton See spoke of her possibly doing cardiac rehab. Informed pt that's based on her decision and schedule availability.  Pt thinks that cardiac rehab is a wonderful idea, and wants to be referred to the services.  Informed pt that I will further update Dr Delton See by routing this message for further review and recommendation, and follow-up thereafter. Pt verbalized understanding and agrees with this plan.

## 2013-12-07 NOTE — Telephone Encounter (Signed)
Message copied by Loa Socks on Fri Dec 07, 2013  4:11 PM ------      Message from: Lars Masson      Created: Fri Dec 07, 2013 12:24 PM       Minimally improved LVEF now 35-40%, previously 30-35%, we will increase lisinopril to 10 mg po daily ------

## 2013-12-07 NOTE — Progress Notes (Signed)
2D Echo completed. 12/07/2013 

## 2013-12-08 NOTE — Telephone Encounter (Signed)
I have written her a letter with work restrictions. She should continue doing that until she is done with cardiac rehab. We will then re-evaluate her symptoms.

## 2013-12-10 ENCOUNTER — Telehealth: Payer: Self-pay | Admitting: Cardiology

## 2013-12-10 NOTE — Telephone Encounter (Signed)
Pt is returning your call. Please call back.

## 2013-12-10 NOTE — Telephone Encounter (Signed)
Contacted pt to inform her that I will have Dr Delton See sign the orders for cardiac rehab on 9/1 and fax to cardiac rehab.  Informed the pt that someone from cardiac rehab at [facility] will be contacting her after the order faxed, to set up an orientation date.  Provided pt education on what cardiac rehab is, and how she would benefit from this program.  Informed the pt that Dr Delton See wants to keep her work restrictions the same, until further advised.  Pt verbalized understanding and agrees with this plan.  Pt pleased with all the assistance provided.

## 2013-12-10 NOTE — Telephone Encounter (Signed)
LMTCB and left detailed message to inform pt that Dr Delton See has written her a letter with her work restrictions, and we will refer her for cardiac rehab. LM that Dr Delton See will fill cardiac rehab papers out tomorrow and then we will fax to [facility] and they will shortly f/u with the pt thereafter to set up orientation.

## 2013-12-12 ENCOUNTER — Telehealth (HOSPITAL_COMMUNITY): Payer: Self-pay | Admitting: Cardiac Rehabilitation

## 2013-12-12 NOTE — Telephone Encounter (Signed)
Message copied by Robyne Peers on Wed Dec 12, 2013 11:03 AM ------      Message from: Lars Masson      Created: Tue Dec 11, 2013  2:43 PM      Regarding: RE: Cardiac Rehab       NYHA II            ----- Message -----         From: Robyne Peers, RN         Sent: 12/11/2013   2:25 PM           To: Lars Masson, MD      Subject: Cardiac Rehab                                            Dear Dr. Delton See,            We received order for cardiac rehab.  What is the pt NYHA class?              Thank you for the referral.            Sincerely,      Deveron Furlong, RN, BSN      Cardiac Pulmonary Rehab             ------

## 2013-12-31 ENCOUNTER — Ambulatory Visit (INDEPENDENT_AMBULATORY_CARE_PROVIDER_SITE_OTHER): Payer: Managed Care, Other (non HMO) | Admitting: Cardiology

## 2013-12-31 ENCOUNTER — Encounter: Payer: Self-pay | Admitting: Cardiology

## 2013-12-31 ENCOUNTER — Encounter: Payer: Self-pay | Admitting: *Deleted

## 2013-12-31 VITALS — BP 118/69 | HR 66 | Ht 64.0 in | Wt 142.4 lb

## 2013-12-31 DIAGNOSIS — I428 Other cardiomyopathies: Secondary | ICD-10-CM

## 2013-12-31 DIAGNOSIS — I420 Dilated cardiomyopathy: Secondary | ICD-10-CM | POA: Insufficient documentation

## 2013-12-31 DIAGNOSIS — I10 Essential (primary) hypertension: Secondary | ICD-10-CM

## 2013-12-31 DIAGNOSIS — I509 Heart failure, unspecified: Secondary | ICD-10-CM

## 2013-12-31 DIAGNOSIS — I5022 Chronic systolic (congestive) heart failure: Secondary | ICD-10-CM

## 2013-12-31 DIAGNOSIS — R002 Palpitations: Secondary | ICD-10-CM

## 2013-12-31 LAB — BASIC METABOLIC PANEL WITH GFR
BUN: 16 mg/dL (ref 6–23)
CO2: 30 meq/L (ref 19–32)
Calcium: 9.6 mg/dL (ref 8.4–10.5)
Chloride: 103 meq/L (ref 96–112)
Creatinine, Ser: 0.8 mg/dL (ref 0.4–1.2)
GFR: 74.62 mL/min
Glucose, Bld: 95 mg/dL (ref 70–99)
Potassium: 4.8 meq/L (ref 3.5–5.1)
Sodium: 139 meq/L (ref 135–145)

## 2013-12-31 NOTE — Progress Notes (Signed)
Patient ID: [name], female   DOB: [birth year], 58 y.o.   MRN: [id number]    Primary Care Provider: No primary provider on file.  Primary Cardiologist: Lars Masson  Problem List  Past Medical History   Diagnosis  Date   .  Osteoporosis     Past Surgical History   Procedure  Laterality  Date   .  Vaginal hysterectomy     Allergies  Allergies   Allergen  Reactions   .  Cefdinir     HPI   58 year old female with h/o gastric reflux and palpitations and significant family history of coronary artery disease was coming with concerns of shortness of breath and palpitations. The patient states that her palpitations started years ago and she underwent Holter monitoring that showed frequent arrhythmia that she cannot specify further but at the time it was recommended that no treatment is necessary. She states that over time her palpitations got worse and are happening almost daily especially when laying in bed and on her left side makes her feel short of breath with improvement when laying on her back. The patient states that one she stopped eating chocolate and fiber bars her symptoms have improved but are still persistent. She states that they last seconds to minutes and are not associated with dizziness.  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 She walks on a regular bases with her husband up to 30 minutes a day however she states at moderate exertion she gets short of breath. She states that that's the way it has been for her and she was never able to run or do any moderate exercise activity.  She has significant family history of coronary artery disease her mother died of massive myocardial infarction at age of 80, there are multiple other family members with CAD. Patient states that her lipids are followed by her primary care physician and are at goal. Her  husband states that she has apneic episodes at night if she snores significantly. She also had a finding of a thyroid nodule 5 years ago that was advised to be followed.   The patient underwent echocardiography that showed left ventricular ejection fraction 30-35% with preserved left ventricular size and diffuse hypokinesis. An exercise nuclear stress test showed no ischemia and patient was started on lisinopril 5 mg daily. The patient is tolerating the medication very well she denies any cough. In fact she feels that her energy level has increased and her shortness of breath has improved minimally. She is working a 12 hour shift and feels exhausted by the end of the day feeling profoundly short of breath.  10/11/2013 - the patient has some improvement of her fatigue mostly because she is out of work and enjoying life. She denies orthopnea, lower extremity edema, paroxysmal nocturnal dyspnea, shortness of breath or chest pain. She is compliant to her medications. She describes symptoms consistent with benign positional vertigo.  12/31/2013 - The patient feels better and stronger, able to work with limited hours restriction. No LE edema, orthopnea, PND. Some fatigue and vivid dreams with lisinopril 10 mg po daily. No CP or DOE, walks daily.  Home Medications  Prior to Admission medications   Medication  Sig  Start Date  End Date  Taking?  Authorizing Provider   ibandronate (BONIVA) 150 MG tablet   06/07/13  Yes  Historical Provider, MD   omeprazole (PRILOSEC) 20 MG capsule  Take 20 mg by mouth daily.    Yes  Historical Provider, MD   Family History  No family history on file.  Social History  History    Social History   .  Marital Status:  Married     Spouse Name:  N/A     Number of Children:  N/A   .  Years of Education:  N/A    Occupational History   .  Not on file.    Social History Main Topics   .  Smoking status:  Never Smoker   .  Smokeless tobacco:  Not on file   .  Alcohol Use:   No   .  Drug Use:  No   .  Sexual Activity:  Not on file    Other Topics  Concern   .  Not on file    Social History Narrative   .  No narrative on file   Review of Systems, as per HPI, otherwise negative  General: No chills, fever, night sweats or weight changes.  Cardiovascular: No chest pain, dyspnea on exertion, edema, orthopnea, palpitations, paroxysmal nocturnal dyspnea.  Dermatological: No rash, lesions/masses  Respiratory: No cough, dyspnea  Urologic: No hematuria, dysuria  Abdominal: No nausea, vomiting, diarrhea, bright red blood per rectum, melena, or hematemesis  Neurologic: No visual changes, wkns, changes in mental status.  All other systems reviewed and are otherwise negative except as noted above.  Physical Exam  Blood pressure 156/83, height  (1.651 m), weight 142 lb (64.411 kg).  General: Pleasant, NAD  Psych: Normal affect.  Neuro: Alert and oriented X 3. Moves all extremities spontaneously.  HEENT: Normal  Neck: Supple without bruits or JVD.  Lungs: Resp regular and unlabored, CTA.  Heart: RRR no s3, s4, or murmurs.  Abdomen: Soft, non-tender, non-distended, BS + x 4.  Extremities: No clubbing, cyanosis or edema. DP/PT/Radials 2+ and equal bilaterally.  Labs:  No results found for this basename: CKTOTAL, CKMB, TROPONINI, in the last 72 hours  No results found for this basename: WBC, HGB, HCT, MCV, PLT   No results found for this basename: NA, K, CL, CO2, BUN, CREATININE, CALCIUM, LABALBU, PROT, BILITOT, ALKPHOS, ALT, AST, GLUCOSE, in the last 168 hours  No results found for this basename: CHOL, HDL, LDLCALC, TRIG    No results found for this basename: DDIMER   No components found with this basename: POCBNP,   Accessory Clinical Findings   Echocardiogram - 07/31/2013  - Left ventricle: The cavity size was normal. Wall thickness was normal. Systolic function was moderately to severely reduced. The estimated ejection fraction was in the range of 30%  to 35%. Diffuse hypokinesis. Doppler parameters are consistent with abnormal left ventricular relaxation (grade 1 diastolic dysfunction). - Aortic valve: There was no stenosis. Trivial regurgitation. - Mitral valve: Mildly calcified annulus. Mildly calcified leaflets . Trivial regurgitation. - Left atrium: The atrium was mildly dilated. - Right ventricle: The cavity size was normal. Systolic function was normal. - Pulmonary arteries: No complete TR doppler jet so unable to estimate PA systolic pressure. - Inferior vena cava: The vessel was normal in size; the respirophasic diameter changes were in the normal range (= 50%); findings are consistent with normal central venous pressure. Impressions:  - Normal LV size with global hypokinesis, EF 30-35%. Normal RV size and systolic function. No significant valvular abnormalities.   ECG - normal sinus rhythm, 65  beats per minute normal EKG.   Exercise nuclear stress test 08/23/2013  Impression  Exercise Capacity: Good exercise capacity.  BP Response: Normal blood pressure response.  Clinical Symptoms: There is dyspnea.  ECG Impression: No significant ST segment change suggestive of ischemia.  Comparison with Prior Nuclear Study: No images to compare  Overall Impression: Low risk stress nuclear study with no significant areas of ischemia identified..  LV Ejection Fraction: Study not gated.  Donato Schultz, MD    Assessment & Plan   Very pleasant 58 year old female   1. Systolic CHF, NYHA class II, non-ischemic cardiomyopathy, ejection fraction 30-35%, the patient was started on lisinopril 5 mg daily, uptitrated to 10 mg po daily, improved LVEF 35-40% on 10/11/13. We won't be able to further increase.  We will repeat echo again and if no improvement, we will push for further uptitrating. She is euvolemic. We will continue limited work hours.  2. Palpitations -  48 hour Holter monitor showed only frequent PVCs and PACs.  3. Sleep  apnea - might be cause of her palpitations we will refer for sleep study   4. Hypertension - controlled  5. History of thyroid nodule - Complex cysts and small nodules in both lobes are noted. Within the right lobe common complex cyst and small nodule are slightly larger  but remain be lobe criteria for fine needle aspiration biopsy.  Findings do not meet current SRU consensus criteria for biopsy.  6. Lipids - at goal and no evidence of coronary artery disease  Followup in 2 months with repeat echocardiogram.   Lars Masson, MD, [facility]  12/31/2013

## 2013-12-31 NOTE — Patient Instructions (Signed)
Your physician recommends that you continue on your current medications as directed. Please refer to the Current Medication list given to you today.  Your physician recommends that you return for lab work in: TODAY [employer]er[employer]ellery)  Your physician has requested that you have an echocardiogram. Echocardiography is a painless test that uses sound waves to create images of your heart. It provides your doctor with information about the size and shape of your heart and how well your heart's chambers and valves are working. This procedure takes approximately one hour. There are no restrictions for this procedure.  Your physician recommends that you schedule a follow-up appointment in: 2 MONTHS WITH DR Delton See

## 2014-01-03 ENCOUNTER — Telehealth: Payer: Self-pay | Admitting: Cardiology

## 2014-01-03 NOTE — Telephone Encounter (Signed)
Dr Delton See spoke with this pt in regards to her insurance and cardiac rehab and with her work restrictions and current plan in the future.  Dr Delton See informed the pt that whatever paperwork needed to be filled out or follow-up on in regards to her work and work restrictions, then she can bring to the office and she will be glad to assist in anyway.  Pt verbalized understanding and agrees with this plan, and pleased with all the assistance provided.

## 2014-01-03 NOTE — Telephone Encounter (Signed)
New problem    Pt want you to call her.

## 2014-01-18 ENCOUNTER — Other Ambulatory Visit: Payer: Self-pay

## 2014-01-18 DIAGNOSIS — Z1239 Encounter for other screening for malignant neoplasm of breast: Secondary | ICD-10-CM

## 2014-01-23 ENCOUNTER — Telehealth (HOSPITAL_COMMUNITY): Payer: Self-pay | Admitting: Cardiac Rehabilitation

## 2014-01-23 NOTE — Telephone Encounter (Signed)
Pt is scheduled to begin cardiac rehab at [facility] which is closer to her home on Thursday 01/24/14.

## 2014-02-06 ENCOUNTER — Ambulatory Visit (HOSPITAL_COMMUNITY): Payer: Managed Care, Other (non HMO) | Attending: Internal Medicine | Admitting: Cardiology

## 2014-02-06 DIAGNOSIS — I428 Other cardiomyopathies: Secondary | ICD-10-CM

## 2014-02-06 DIAGNOSIS — E785 Hyperlipidemia, unspecified: Secondary | ICD-10-CM | POA: Diagnosis not present

## 2014-02-06 DIAGNOSIS — I5022 Chronic systolic (congestive) heart failure: Secondary | ICD-10-CM | POA: Diagnosis present

## 2014-02-06 DIAGNOSIS — R002 Palpitations: Secondary | ICD-10-CM

## 2014-02-06 DIAGNOSIS — I10 Essential (primary) hypertension: Secondary | ICD-10-CM | POA: Diagnosis not present

## 2014-02-06 DIAGNOSIS — I420 Dilated cardiomyopathy: Secondary | ICD-10-CM

## 2014-02-06 NOTE — Progress Notes (Signed)
Limited echo performed. 

## 2014-02-07 ENCOUNTER — Telehealth: Payer: Self-pay | Admitting: Cardiology

## 2014-02-07 NOTE — Telephone Encounter (Signed)
Pt calling to inform Dr Delton See and request a return to work without any restrictions.  Pt states she wants Dr Delton See to write an official letter stating that the pt can return to work with no restrictions starting February 24, 2014.  Pt states she can pick this letter up next Monday.  Pt states her job has really been giving her a hard time, and has left her with 2 options :  To retire or to return to work with no restrictions.  Pt states financially she cannot retire at this time.  Informed the pt that Dr Delton See is out of the office today, but I will route this message to her for further review and recommendation and follow-up thereafter.  Pt verbalized understanding and agrees with this plan.

## 2014-02-07 NOTE — Telephone Encounter (Signed)
Follow up  ° ° ° °Returning call back to nurse  °

## 2014-02-08 ENCOUNTER — Encounter: Payer: Self-pay | Admitting: Cardiology

## 2014-02-08 NOTE — Telephone Encounter (Signed)
Letter has been placed in folder at the front desk for pt to pick up.

## 2014-02-08 NOTE — Telephone Encounter (Signed)
Larita Fife,  Could you please place this text in a letter form:   Ms Maliaka Slevin is currently under my care for congestive heart failure. Her condition has improved and allows her  To work full time without any restrictions.  Please don't hesitate to call us with any questions,  Sincerely,  Tobias Alexander

## 2014-02-11 ENCOUNTER — Telehealth: Payer: Self-pay | Admitting: Cardiology

## 2014-02-11 NOTE — Telephone Encounter (Signed)
Calling wanting to know if letter was ready.  Per Larita Fife Via's note letter was left at front desk.  Advised pt.  She will pick up tomorrow.

## 2014-02-11 NOTE — Telephone Encounter (Signed)
New message     Patient calling in reference to a letter.

## 2014-02-20 ENCOUNTER — Ambulatory Visit: Payer: Managed Care, Other (non HMO)

## 2014-02-22 ENCOUNTER — Other Ambulatory Visit: Payer: Self-pay

## 2014-02-22 ENCOUNTER — Telehealth: Payer: Self-pay

## 2014-02-22 DIAGNOSIS — Z1231 Encounter for screening mammogram for malignant neoplasm of breast: Secondary | ICD-10-CM

## 2014-02-22 NOTE — Telephone Encounter (Addendum)
Rosey Bath from [employer] (the pts work place) called asking if we hand wrote in blue ink the date of 01/24/14 as return to work date on the pts return to work Physicist, medical.   She is advised that I gave the pt her letter written by Dr Delton See and that we did not write in a return to work date.  Rosey Bath states that she needs [place] to re-write the letter and include the return to full time work without restrictions on either 10/15 (which was written on the letter) or 10/30 (the day the letter was written).  Rosey Bath wants [place] to fax the letter to her at [phone].  Is it okay to type letter and if so what day is her return to work date?

## 2014-02-25 ENCOUNTER — Encounter (INDEPENDENT_AMBULATORY_CARE_PROVIDER_SITE_OTHER): Payer: Self-pay

## 2014-02-25 ENCOUNTER — Encounter: Payer: Self-pay | Admitting: *Deleted

## 2014-02-25 ENCOUNTER — Ambulatory Visit
Admission: RE | Admit: 2014-02-25 | Discharge: 2014-02-25 | Disposition: A | Discharge status: Home / Self Care | Payer: Managed Care, Other (non HMO) | Source: Ambulatory Visit

## 2014-02-25 DIAGNOSIS — Z1231 Encounter for screening mammogram for malignant neoplasm of breast: Secondary | ICD-10-CM

## 2014-02-25 NOTE — Telephone Encounter (Signed)
Please fax to bellow stated number.

## 2014-02-25 NOTE — Telephone Encounter (Signed)
Letter faxed today 02/25/14

## 2014-02-26 ENCOUNTER — Telehealth: Payer: Self-pay | Admitting: Cardiology

## 2014-02-26 ENCOUNTER — Other Ambulatory Visit: Payer: Self-pay | Admitting: Gynecology

## 2014-02-26 DIAGNOSIS — M810 Age-related osteoporosis without current pathological fracture: Secondary | ICD-10-CM

## 2014-02-26 NOTE — Telephone Encounter (Signed)
Note to rtn to work needs to be added the date she could rtn to work as 02-24-14, will pick it up on Friday when she has an appt

## 2014-03-01 ENCOUNTER — Encounter: Payer: Self-pay | Admitting: Cardiology

## 2014-03-01 ENCOUNTER — Ambulatory Visit (INDEPENDENT_AMBULATORY_CARE_PROVIDER_SITE_OTHER): Payer: Managed Care, Other (non HMO) | Admitting: Cardiology

## 2014-03-01 VITALS — BP 125/60 | HR 64 | Ht 64.0 in | Wt 141.0 lb

## 2014-03-01 DIAGNOSIS — I5022 Chronic systolic (congestive) heart failure: Secondary | ICD-10-CM

## 2014-03-01 DIAGNOSIS — I10 Essential (primary) hypertension: Secondary | ICD-10-CM

## 2014-03-01 LAB — BASIC METABOLIC PANEL
BUN: 16 mg/dL (ref 6–23)
CO2: 28 mEq/L (ref 19–32)
Calcium: 9.5 mg/dL (ref 8.4–10.5)
Chloride: 104 mEq/L (ref 96–112)
Creatinine, Ser: 1 mg/dL (ref 0.4–1.2)
GFR: 63.82 mL/min (ref >60.00)
Glucose, Bld: 95 mg/dL (ref 70–99)
Potassium: 3.9 mEq/L (ref 3.5–5.1)
Sodium: 139 mEq/L (ref 135–145)

## 2014-03-01 NOTE — Progress Notes (Signed)
Patient ID: [name], female   DOB: [birth year], 58 y.o.   MRN: [id number]     Primary Care Provider: No primary provider on file.  Primary Cardiologist: Lars Masson  Problem List  Past Medical History   Diagnosis  Date   .  Osteoporosis     Past Surgical History   Procedure  Laterality  Date   .  Vaginal hysterectomy     Allergies  Allergies   Allergen  Reactions   .  Cefdinir     HPI   58 year old female with h/o gastric reflux and palpitations and significant family history of coronary artery disease was coming with concerns of shortness of breath and palpitations. The patient states that her palpitations started years ago and she underwent Holter monitoring that showed frequent arrhythmia that she cannot specify further but at the time it was recommended that no treatment is necessary. She states that over time her palpitations got worse and are happening almost daily especially when laying in bed and on her left side makes her feel short of breath with improvement when laying on her back. The patient states that one she stopped eating chocolate and fiber bars her symptoms have improved but are still persistent. She states that they last seconds to minutes and are not associated with dizziness.  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 She walks on a regular bases with her husband up to 30 minutes a day however she states at moderate exertion she gets short of breath. She states that that's the way it has been for her and she was never able to run or do any moderate exercise activity.  She has significant family history of coronary artery disease her mother died of massive myocardial infarction at age of 75, there are multiple other family members with CAD. Patient states that her lipids are followed by her primary care physician and are at goal. Her  husband states that she has apneic episodes at night if she snores significantly. She also had a finding of a thyroid nodule 5 years ago that was advised to be followed.   The patient underwent echocardiography that showed left ventricular ejection fraction 30-35% with preserved left ventricular size and diffuse hypokinesis. An exercise nuclear stress test showed no ischemia and patient was started on lisinopril 5 mg daily. The patient is tolerating the medication very well she denies any cough. In fact she feels that her energy level has increased and her shortness of breath has improved minimally. She is working a 12 hour shift and feels exhausted by the end of the day feeling profoundly short of breath.  10/11/2013 - the patient has some improvement of her fatigue mostly because she is out of work and enjoying life. She denies orthopnea, lower extremity edema, paroxysmal nocturnal dyspnea, shortness of breath or chest pain. She is compliant to her medications. She describes symptoms consistent with benign positional vertigo.  12/31/2013 - The patient feels better and stronger, able to work with limited hours restriction. No LE edema, orthopnea, PND. Some fatigue and vivid dreams with lisinopril 10 mg po daily. No CP or DOE, walks daily.  03/01/2014 - the patient continues going to cardiac rehabilitation and feels significantly stronger list iron. Her shortness of breath has significantly improved on the top of days her echocardiogram has finally showed improvement in  her left ventricular ejection fraction currently 50-55%. Previously 30-35%. No lower extremity edema no orthopnea or proximal nocturnal dyspnea. The patient complains of mildly productive cough that has been going on for the last months, however it is associated with postnasal drip.  Home Medications  Prior to Admission medications   Medication  Sig  Start Date  End Date  Taking?  Authorizing Provider   ibandronate (BONIVA) 150 MG tablet    06/07/13   Yes  Historical Provider, MD   omeprazole (PRILOSEC) 20 MG capsule  Take 20 mg by mouth daily.    Yes  Historical Provider, MD   Family History  No family history on file.  Social History  History    Social History   .  Marital Status:  Married     Spouse Name:  N/A     Number of Children:  N/A   .  Years of Education:  N/A    Occupational History   .  Not on file.    Social History Main Topics   .  Smoking status:  Never Smoker   .  Smokeless tobacco:  Not on file   .  Alcohol Use:  No   .  Drug Use:  No   .  Sexual Activity:  Not on file    Other Topics  Concern   .  Not on file    Social History Narrative   .  No narrative on file   Review of Systems, as per HPI, otherwise negative  General: No chills, fever, night sweats or weight changes.  Cardiovascular: No chest pain, dyspnea on exertion, edema, orthopnea, palpitations, paroxysmal nocturnal dyspnea.  Dermatological: No rash, lesions/masses  Respiratory: No cough, dyspnea  Urologic: No hematuria, dysuria  Abdominal: No nausea, vomiting, diarrhea, bright red blood per rectum, melena, or hematemesis  Neurologic: No visual changes, wkns, changes in mental status.  All other systems reviewed and are otherwise negative except as noted above.  Physical Exam  Blood pressure 156/83, height 5\' 5"  (1.651 m), weight 142 lb (64.411 kg).  General: Pleasant, NAD  Psych: Normal affect.  Neuro: Alert and oriented X 3. Moves all extremities spontaneously.  HEENT: Normal  Neck: Supple without bruits or JVD.  Lungs: Resp regular and unlabored, CTA.  Heart: RRR no s3, s4, or murmurs.  Abdomen: Soft, non-tender, non-distended, BS + x 4.  Extremities: No clubbing, cyanosis or edema. DP/PT/Radials 2+ and equal bilaterally.  Labs:  No results found for this basename: CKTOTAL, CKMB, TROPONINI, in the last 72 hours  No results found for this basename: WBC, HGB, HCT, MCV, PLT   No results found for this basename: NA, K, CL,  CO2, BUN, CREATININE, CALCIUM, LABALBU, PROT, BILITOT, ALKPHOS, ALT, AST, GLUCOSE, in the last 168 hours  No results found for this basename: CHOL, HDL, LDLCALC, TRIG    No results found for this basename: DDIMER   No components found with this basename: POCBNP,   Accessory Clinical Findings   Echocardiogram - 07/31/2013  - Left ventricle: The cavity size was normal. Wall thickness was normal. Systolic function was moderately to severely reduced. The estimated ejection fraction was in the range of 30% to 35%. Diffuse hypokinesis. Doppler parameters are consistent with abnormal left ventricular relaxation (grade 1 diastolic dysfunction). - Aortic valve: There was no stenosis. Trivial regurgitation. - Mitral valve: Mildly calcified annulus. Mildly calcified leaflets . Trivial regurgitation. - Left atrium: The atrium was mildly dilated. - Right ventricle: The cavity size was  normal. Systolic function was normal. - Pulmonary arteries: No complete TR doppler jet so unable to estimate PA systolic pressure. - Inferior vena cava: The vessel was normal in size; the respirophasic diameter changes were in the normal range (= 50%); findings are consistent with normal central venous pressure. Impressions:  - Normal LV size with global hypokinesis, EF 30-35%. Normal RV size and systolic function. No significant valvular abnormalities.   ECG - normal sinus rhythm, 65 beats per minute normal EKG.   Exercise nuclear stress test 08/23/2013  Impression  Exercise Capacity: Good exercise capacity.  BP Response: Normal blood pressure response.  Clinical Symptoms: There is dyspnea.  ECG Impression: No significant ST segment change suggestive of ischemia.  Comparison with Prior Nuclear Study: No images to compare  Overall Impression: Low risk stress nuclear study with no significant areas of ischemia identified..  LV Ejection Fraction: Study not gated.  Donato SchultzMark Skains, MD  Echocardiogram :  02/06/2014   - Procedure narrative: Limited study for LV function. - Left ventricle: The cavity size was mildly dilated. Wall thickness was normal. Systolic function was normal. The estimated ejection fraction was in the range of 50% to 55%. - Ventricular septum: Incoordinate distal septal motion.  Impressions:  - Compared to the prior echo in 11/2013, the EF has improved from 35-40% up to 50-55%.    Assessment & Plan   Very pleasant 58 year old female   1. Systolic CHF, NYHA class II, non-ischemic cardiomyopathy, ejection fraction 30-35%, the patient was started on lisinopril 5 mg daily, uptitrated to 10 mg po daily, with improvement of LV EF to 50-55% on 02/06/2014. We will continue the same dose of lisinopril at this point patient complains of cough but also has postnasal drip. Since her cough is mildly productive we will wait for couple weeks and if it doesn't resolve we will consider switching ACE inhibitor to ARB.  The patient is euvolemic and now functional class NYHA 1 we will fill paperwork for her to return to work full time as she wishes.  2. Palpitations -  48 hour Holter monitor showed only frequent PVCs and PACs.  3. Sleep apnea - might be cause of her palpitations we will refer for sleep study   4. Hypertension - controlled  5. History of thyroid nodule - Complex cysts and small nodules in both lobes are noted. Within the right lobe common complex cyst and small nodule are slightly larger  but remain be lobe criteria for fine needle aspiration biopsy.  Findings do not meet current SRU consensus criteria for biopsy.  6. Lipids - at goal and no evidence of coronary artery disease  Followup in 6 months, basic metabolic profile today.Lars Masson.   Hortense Cantrall H, MD, [facility]  03/01/2014

## 2014-03-01 NOTE — Patient Instructions (Signed)
Your physician recommends that you continue on your current medications as directed. Please refer to the Current Medication list given to you today.    Your physician recommends that you return for lab work in: TODAY ---BMET      Your physician wants you to follow-up in: 6 MONTHS WITH DR [name] You will receive a reminder letter in the mail two months in advance. If you don't receive a letter, please call our office to schedule the follow-up appointment.    

## 2014-03-11 ENCOUNTER — Ambulatory Visit
Admission: RE | Admit: 2014-03-11 | Discharge: 2014-03-11 | Disposition: A | Discharge status: Home / Self Care | Payer: Managed Care, Other (non HMO) | Source: Ambulatory Visit | Attending: Gynecology | Admitting: Gynecology

## 2014-03-11 ENCOUNTER — Telehealth: Payer: Self-pay | Admitting: Cardiology

## 2014-03-11 DIAGNOSIS — M810 Age-related osteoporosis without current pathological fracture: Secondary | ICD-10-CM

## 2014-03-11 NOTE — Telephone Encounter (Signed)
Walk In pt Form " FMLA" Dropped Off Sent To [employer] For Completion/KM

## 2014-03-13 ENCOUNTER — Telehealth: Payer: Self-pay | Admitting: *Deleted

## 2014-03-13 NOTE — Telephone Encounter (Signed)
New Message  Left vm for pt concerning flu shot 

## 2014-03-14 ENCOUNTER — Telehealth: Payer: Self-pay | Admitting: Cardiology

## 2014-03-14 NOTE — Telephone Encounter (Signed)
Walk In pt Form " Questions about Insurance papers" gave to [name]/KM

## 2014-03-14 NOTE — Telephone Encounter (Signed)
Left Pt Vm To Return My Call, she has Questions about Papers that Were Dropped Off/ KM

## 2014-03-21 ENCOUNTER — Telehealth: Payer: Self-pay | Admitting: Cardiology

## 2014-03-21 NOTE — Telephone Encounter (Signed)
Pt calling to inform us that her job, [employer], is requesting pts records such as her last echo, cardiac rehab report, Dr Lindaann Slough last OV note.  Informed the pt that she should contact Kim in medical records for this information, for she will need a written consent signed by the pt to give this information to her employer and she would need to make arrangements for the pt to come and pick this information up.  Pt verbalized understanding and said she will be calling Selena Batten in medical records today or tomorrow.

## 2014-03-21 NOTE — Telephone Encounter (Signed)
New Msg  Pt needs a note from Dr. Delton See stating that she was seen at the office. [phone], [phone], husband cell [phone]. She states to try any of those numbers.

## 2014-04-16 ENCOUNTER — Telehealth: Payer: Self-pay | Admitting: Cardiology

## 2014-04-16 NOTE — Telephone Encounter (Signed)
Dr.[name] had to complete page 2 Subsection B of the [employer] for Duty paper called left pt message [employer] paper ready for pick up

## 2014-04-24 ENCOUNTER — Telehealth: Payer: Self-pay | Admitting: Cardiology

## 2014-04-24 NOTE — Telephone Encounter (Signed)
LVM for pt [employer] paperwork Dr.[name] completed ready for pick up

## 2014-04-29 ENCOUNTER — Telehealth: Payer: Self-pay | Admitting: Cardiology

## 2014-04-29 NOTE — Telephone Encounter (Signed)
[employer] for Certification papers mailed to pt's  home address per her request.

## 2014-05-01 ENCOUNTER — Telehealth: Payer: Self-pay | Admitting: Cardiology

## 2014-05-01 NOTE — Telephone Encounter (Signed)
[employer] for Duty paper faxed to [place] [employer] department [employer] for [employer] at [phone] call back for linda [phone] Fax confirmed pt made aware faxed.

## 2014-06-17 ENCOUNTER — Telehealth: Payer: Self-pay | Admitting: Cardiology

## 2014-06-17 NOTE — Telephone Encounter (Signed)
New Message     Pt c/o medication issue:  1. Name of Medication: Lisinopril  2. How are you currently taking this medication (dosage and times per day)?10mg  daily   3. Are you having a reaction (difficulty breathing--STAT)? Continually coughing   4. What is your medication issue? For her heart   Please give a call

## 2014-06-17 NOTE — Telephone Encounter (Signed)
Pt calling to inform Dr Delton See that she has had a nagging cough for awhile now, but recently has worsened within the past few weeks.  Pt is wondering if the Lisinopril would be causing this, although she has been on this med for several months.  Pt very worried about coming off this med, for she has responded very well to this med from a cardiac standpoint.  Pts cough has been very minor and easily tolerated for awhile, until recently it has worsened.  Pt suggest allergies could be a factor as well.  Informed that Dr Delton See is currently out of the office today, but I will route this message to her for further review and recommendation and follow-up thereafter.  Plan agreeable.

## 2014-06-18 MED ORDER — LOSARTAN POTASSIUM 25 MG PO TABS: 25.0000 mg | ORAL_TABLET | Freq: Every day | ORAL | Status: DC

## 2014-06-18 NOTE — Telephone Encounter (Signed)
Is her cough non-productive? If yes, Please switch her lisinopril 10 mg po daily to losartan 25 mg po daily.

## 2014-06-18 NOTE — Telephone Encounter (Signed)
Contacted the pt to ask her if she has a productive or non-productive cough per Dr Delton See.  Per the pt she states that she has a dry, nagging cough that's non-productive.  Informed the pt that per Dr Delton See she recommends that we d/c her lisinopril 10 mg po daily, and start Losartan 25 mg po daily.  Informed the pt to give our office feedback on her response to the new med change. Confirmed the pharmacy of choice with the pt.  Pt verbalized understanding and agrees with this plan.

## 2014-07-01 ENCOUNTER — Telehealth: Payer: Self-pay | Admitting: Cardiology

## 2014-07-01 MED ORDER — VALSARTAN 40 MG PO TABS: 40.0000 mg | ORAL_TABLET | Freq: Every day | ORAL | Status: DC

## 2014-07-01 NOTE — Telephone Encounter (Addendum)
Patient called back to report that she is not tolerating the Losartan as well as she had hoped she would in that it is making her feel fatigued and "like I'm really dragging". She states she has no energy since starting the Losartan last week. She is hoping there is an alternative medication available. Dr. Delton See reviewed. She is discontinuing the Losartan. The patient will START on Valsartan 40 mg by mouth daily. Rx called in to pharmacy. Left detailed message for patient on home phone (as she requested me to do) to inform her of medication change. Also left message that she can call us back for any questions or further concerns.

## 2014-07-01 NOTE — Telephone Encounter (Signed)
I left a message for the patient to call. 

## 2014-07-01 NOTE — Telephone Encounter (Signed)
New message       Pt c/o medication issue:  1. Name of Medication: losartan  2. How are you currently taking this medication (dosage and times per day)? 25mg  3. Are you having a reaction (difficulty breathing--STAT)? no  4. What is your medication issue?  Pt is fatigued since starting this medication?  Please advise

## 2014-07-02 ENCOUNTER — Telehealth: Payer: Self-pay | Admitting: Cardiology

## 2014-07-02 NOTE — Telephone Encounter (Signed)
New Msg         Pt calling about a medication that was prescribed yesterday,   Pt not sure about the name of it.   Please return pt call.

## 2014-07-02 NOTE — Telephone Encounter (Signed)
Pt wanting more education on newly prescribed medication Valsartan (Diovan), that was prescribed by Dr Delton See on 07/01/14.  Provided pt education on this drug and what purpose it serves.  Informed the pt of common side effects with this drug.  Advised the pt to provide [place] with feedback within the next week or 2, on how she is responding to this medication.  Pt verbalized understanding, gracious for all the assistance provided, and agrees with this plan.

## 2014-07-23 ENCOUNTER — Other Ambulatory Visit: Payer: Self-pay | Admitting: Family Medicine

## 2014-07-23 DIAGNOSIS — N644 Mastodynia: Secondary | ICD-10-CM

## 2014-07-26 ENCOUNTER — Other Ambulatory Visit: Payer: Managed Care, Other (non HMO)

## 2014-07-29 ENCOUNTER — Ambulatory Visit
Admission: RE | Admit: 2014-07-29 | Discharge: 2014-07-29 | Disposition: A | Discharge status: Home / Self Care | Payer: BLUE CROSS/BLUE SHIELD | Source: Ambulatory Visit | Attending: Family Medicine | Admitting: Family Medicine

## 2014-07-29 DIAGNOSIS — N644 Mastodynia: Secondary | ICD-10-CM

## 2014-08-30 ENCOUNTER — Encounter: Payer: Self-pay | Admitting: Cardiology

## 2014-08-30 ENCOUNTER — Ambulatory Visit (INDEPENDENT_AMBULATORY_CARE_PROVIDER_SITE_OTHER): Payer: BLUE CROSS/BLUE SHIELD | Admitting: Cardiology

## 2014-08-30 VITALS — BP 130/70 | HR 66 | Ht 64.0 in | Wt 146.0 lb

## 2014-08-30 DIAGNOSIS — I10 Essential (primary) hypertension: Secondary | ICD-10-CM

## 2014-08-30 DIAGNOSIS — R002 Palpitations: Secondary | ICD-10-CM | POA: Diagnosis not present

## 2014-08-30 DIAGNOSIS — I429 Cardiomyopathy, unspecified: Secondary | ICD-10-CM

## 2014-08-30 DIAGNOSIS — I428 Other cardiomyopathies: Secondary | ICD-10-CM

## 2014-08-30 MED ORDER — OLMESARTAN MEDOXOMIL 20 MG PO TABS: 20.0000 mg | ORAL_TABLET | Freq: Every day | ORAL | Status: DC

## 2014-08-30 NOTE — Progress Notes (Signed)
Patient ID: [name], female   DOB: 12-08-1955, 59 y.o.   MRN: [id number]     Primary Care Provider: No primary provider on file.  Primary Cardiologist: Lars Masson  Problem List  Past Medical History   Diagnosis  Date   .  Osteoporosis     Past Surgical History   Procedure  Laterality  Date   .  Vaginal hysterectomy     Allergies  Allergies   Allergen  Reactions   .  Cefdinir     Chief complain: Fatigue  HPI   59 year old female with h/o gastric reflux and palpitations and significant family history of coronary artery disease was coming with concerns of shortness of breath and palpitations. The patient states that her palpitations started years ago and she underwent Holter monitoring that showed frequent arrhythmia that she cannot specify further but at the time it was recommended that no treatment is necessary. She states that over time her palpitations got worse and are happening almost daily especially when laying in bed and on her left side makes her feel short of breath with improvement when laying on her back. The patient states that one she stopped eating chocolate and fiber bars her symptoms have improved but are still persistent. She states that they last seconds to minutes and are not associated with dizziness.  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 She walks on a regular bases with her husband up to 30 minutes a day however she states at moderate exertion she gets short of breath. She states that that's the way it has been for her and she was never able to run or do any moderate exercise activity.  She has significant family history of coronary artery disease her mother died of massive myocardial infarction at age of 45, there are multiple other family members with CAD. Patient states that her lipids are followed by her primary care  physician and are at goal. Her husband states that she has apneic episodes at night if she snores significantly. She also had a finding of a thyroid nodule 5 years ago that was advised to be followed.   The patient underwent echocardiography that showed left ventricular ejection fraction 30-35% with preserved left ventricular size and diffuse hypokinesis. An exercise nuclear stress test showed no ischemia and patient was started on lisinopril 5 mg daily. The patient is tolerating the medication very well she denies any cough. In fact she feels that her energy level has increased and her shortness of breath has improved minimally. She is working a 12 hour shift and feels exhausted by the end of the day feeling profoundly short of breath.  10/11/2013 - the patient has some improvement of her fatigue mostly because she is out of work and enjoying life. She denies orthopnea, lower extremity edema, paroxysmal nocturnal dyspnea, shortness of breath or chest pain. She is compliant to her medications. She describes symptoms consistent with benign positional vertigo.  12/31/2013 - The patient feels better and stronger, able to work with limited hours restriction. No LE edema, orthopnea, PND. Some fatigue and vivid dreams with lisinopril 10 mg po daily. No CP or DOE, walks daily.  03/01/2014 - the patient continues going to cardiac rehabilitation and feels significantly stronger list iron. Her shortness of breath has significantly improved on the top of days her echocardiogram has  finally showed improvement in her left ventricular ejection fraction currently 50-55%. Previously 30-35%. No lower extremity edema no orthopnea or proximal nocturnal dyspnea. The patient complains of mildly productive cough that has been going on for the last months, however it is associated with postnasal drip.  08/30/14 - the patient is coming after 6 months, in October 2015 her LVEF improved to normal values 50-55% and symptomatically she  stated that she felt like in her 29' again. She developed cough with ACEI and lisinopril was discontinued. We tried losartan that gave her significant fatigue, then switched to valsartan that is better but she still fells tired, especially after work. She completed cardiac rehab and hasn't been exercising since. She denies palpitations, syncope, SOB, CP, orthopnea or PND. No LE edema.   Home Medications  Prior to Admission medications   Medication  Sig  Start Date  End Date  Taking?  Authorizing Provider   ibandronate (BONIVA) 150 MG tablet   06/07/13   Yes  Historical Provider, MD   omeprazole (PRILOSEC) 20 MG capsule  Take 20 mg by mouth daily.    Yes  Historical Provider, MD   Family History  No family history on file.  Social History  History    Social History   .  Marital Status:  Married     Spouse Name:  N/A     Number of Children:  N/A   .  Years of Education:  N/A    Occupational History   .  Not on file.    Social History Main Topics   .  Smoking status:  Never Smoker   .  Smokeless tobacco:  Not on file   .  Alcohol Use:  No   .  Drug Use:  No   .  Sexual Activity:  Not on file    Other Topics  Concern   .  Not on file    Social History Narrative   .  No narrative on file   Review of Systems, as per HPI, otherwise negative  General: No chills, fever, night sweats or weight changes.  Cardiovascular: No chest pain, dyspnea on exertion, edema, orthopnea, palpitations, paroxysmal nocturnal dyspnea.  Dermatological: No rash, lesions/masses  Respiratory: No cough, dyspnea  Urologic: No hematuria, dysuria  Abdominal: No nausea, vomiting, diarrhea, bright red blood per rectum, melena, or hematemesis  Neurologic: No visual changes, wkns, changes in mental status.  All other systems reviewed and are otherwise negative except as noted above.  Physical Exam  Blood pressure 156/83, height  (1.651 m), weight 142 lb (64.411 kg).  General: Pleasant, NAD  Psych: Normal  affect.  Neuro: Alert and oriented X 3. Moves all extremities spontaneously.  HEENT: Normal  Neck: Supple without bruits or JVD.  Lungs: Resp regular and unlabored, CTA.  Heart: RRR no s3, s4, or murmurs.  Abdomen: Soft, non-tender, non-distended, BS + x 4.  Extremities: No clubbing, cyanosis or edema. DP/PT/Radials 2+ and equal bilaterally.  Labs:  No results found for this basename: CKTOTAL, CKMB, TROPONINI, in the last 72 hours  No results found for this basename: WBC, HGB, HCT, MCV, PLT   No results found for this basename: NA, K, CL, CO2, BUN, CREATININE, CALCIUM, LABALBU, PROT, BILITOT, ALKPHOS, ALT, AST, GLUCOSE, in the last 168 hours  No results found for this basename: CHOL, HDL, LDLCALC, TRIG    No results found for this basename: DDIMER   No components found with this basename: POCBNP,   Accessory  Clinical Findings   Echocardiogram - 07/31/2013  - Left ventricle: The cavity size was normal. Wall thickness was normal. Systolic function was moderately to severely reduced. The estimated ejection fraction was in the range of 30% to 35%. Diffuse hypokinesis. Doppler parameters are consistent with abnormal left ventricular relaxation (grade 1 diastolic dysfunction). - Aortic valve: There was no stenosis. Trivial regurgitation. - Mitral valve: Mildly calcified annulus. Mildly calcified leaflets . Trivial regurgitation. - Left atrium: The atrium was mildly dilated. - Right ventricle: The cavity size was normal. Systolic function was normal. - Pulmonary arteries: No complete TR doppler jet so unable to estimate PA systolic pressure. - Inferior vena cava: The vessel was normal in size; the respirophasic diameter changes were in the normal range (= 50%); findings are consistent with normal central venous pressure. Impressions:  - Normal LV size with global hypokinesis, EF 30-35%. Normal RV size and systolic function. No significant valvular abnormalities.   ECG - normal  sinus rhythm, 65 beats per minute normal EKG.   Exercise nuclear stress test 08/23/2013  Impression  Exercise Capacity: Good exercise capacity.  BP Response: Normal blood pressure response.  Clinical Symptoms: There is dyspnea.  ECG Impression: No significant ST segment change suggestive of ischemia.  Comparison with Prior Nuclear Study: No images to compare  Overall Impression: Low risk stress nuclear study with no significant areas of ischemia identified..  LV Ejection Fraction: Study not gated.  Donato Schultz, MD  Echocardiogram : 02/06/2014   - Procedure narrative: Limited study for LV function. - Left ventricle: The cavity size was mildly dilated. Wall thickness was normal. Systolic function was normal. The estimated ejection fraction was in the range of 50% to 55%. - Ventricular septum: Incoordinate distal septal motion.  Impressions:  - Compared to the prior echo in 11/2013, the EF has improved from 35-40% up to 50-55%.    Assessment & Plan   Very pleasant 59 year old female   1. Systolic CHF, NYHA class II, non-ischemic cardiomyopathy, ejection fraction 30-35%, improved to 50-55%,  Cough with ACEI, fatigue with losartan and valsartan, we will try Benicar - olmesartan 20 mg po daily and see how that works for her.   2. Palpitations -  48 hour Holter monitor showed only frequent PVCs and PACs.  3. Sleep apnea - might be cause of her palpitations we will refer for sleep study   4. Hypertension - controlled  5. History of thyroid nodule - Complex cysts and small nodules in both lobes are noted. Within the right lobe common complex cyst and small nodule are slightly larger  but remain be lobe criteria for fine needle aspiration biopsy.  Findings do not meet current SRU consensus criteria for biopsy.  6. Lipids - at goal and no evidence of coronary artery disease  Followup in 4 months, encouraged to restart exercise program.  Lars Masson, MD, [facility]    08/30/2014

## 2014-08-30 NOTE — Patient Instructions (Addendum)
Medication Instructions:  Your physician has recommended you make the following change in your medication:  1) STOP Valsartan 2) START Benicar 20mg  daily  Labwork: None   Testing/Procedures: Your physician has requested that you have an echocardiogram. Echocardiography is a painless test that uses sound waves to create images of your heart. It provides your doctor with information about the size and shape of your heart and how well your heart's chambers and valves are working. This procedure takes approximately one hour. There are no restrictions for this procedure. (To be scheduled in August 2016)  Follow-Up: Your physician recommends that you schedule a follow-up appointment in: 3 months (To be scheduled after echo)  Any Other Special Instructions Will Be Listed Below (If Applicable).

## 2014-10-16 ENCOUNTER — Telehealth: Payer: Self-pay

## 2014-10-16 NOTE — Telephone Encounter (Signed)
Pt. called in requesting more samples of Benicar. Said she feels normal again. She has 4 boxes of 7 pills each,( one month supply.) She will be seen in August.

## 2014-11-14 ENCOUNTER — Other Ambulatory Visit: Payer: Self-pay | Admitting: Cardiology

## 2014-11-14 MED ORDER — OLMESARTAN MEDOXOMIL 20 MG PO TABS: 20.0000 mg | ORAL_TABLET | Freq: Every day | ORAL | Status: DC

## 2014-11-22 ENCOUNTER — Ambulatory Visit (HOSPITAL_COMMUNITY): Payer: BLUE CROSS/BLUE SHIELD | Attending: Cardiology

## 2014-11-22 ENCOUNTER — Other Ambulatory Visit: Payer: Self-pay

## 2014-11-22 ENCOUNTER — Telehealth: Payer: Self-pay

## 2014-11-22 DIAGNOSIS — I517 Cardiomegaly: Secondary | ICD-10-CM | POA: Insufficient documentation

## 2014-11-22 DIAGNOSIS — I351 Nonrheumatic aortic (valve) insufficiency: Secondary | ICD-10-CM | POA: Insufficient documentation

## 2014-11-22 DIAGNOSIS — I10 Essential (primary) hypertension: Secondary | ICD-10-CM | POA: Diagnosis not present

## 2014-11-22 DIAGNOSIS — I429 Cardiomyopathy, unspecified: Secondary | ICD-10-CM | POA: Diagnosis not present

## 2014-11-22 DIAGNOSIS — I358 Other nonrheumatic aortic valve disorders: Secondary | ICD-10-CM | POA: Diagnosis not present

## 2014-11-22 DIAGNOSIS — I428 Other cardiomyopathies: Secondary | ICD-10-CM

## 2014-11-22 DIAGNOSIS — Z8249 Family history of ischemic heart disease and other diseases of the circulatory system: Secondary | ICD-10-CM | POA: Diagnosis not present

## 2014-11-22 NOTE — Telephone Encounter (Signed)
Pt has been given samples of Benicar HCT 20mg /12.5mg  by Dr.[name]. Pt should keep appt with Dr.[name] In 2wks

## 2014-11-27 ENCOUNTER — Other Ambulatory Visit (HOSPITAL_COMMUNITY): Payer: BLUE CROSS/BLUE SHIELD

## 2014-11-28 ENCOUNTER — Telehealth: Payer: Self-pay | Admitting: Cardiology

## 2014-11-28 MED ORDER — OLMESARTAN MEDOXOMIL 20 MG PO TABS
20.0000 mg | ORAL_TABLET | Freq: Every day | ORAL | Status: DC
Start: 2014-11-28 — End: 2015-04-16

## 2014-11-28 NOTE — Telephone Encounter (Signed)
Pt and husband calling to let Dr Delton See know that the samples of Benicar/HCT  20/12.5 mg the pt was given last week, is causing her BP to drop to the high 90s/60s.   Per the pt and Husband, since she started taking the samples Dr Delton See gave the pt last week, her BP drops and she feels very tired and weak.  Pt and Husband both state that she cannot handle the Benicar/HCT combination.   Both parties would like for Dr Delton See to advise on the pt going back on her old regimen of regular Benicar 20 mg po daily, for she responded really well to this medication.  Both parties state that her BP this morning recorded at 97/63 and at lunch time it slightly went up at 100/64.  Pt and husband would like the regular Benicar 20 mg po daily to be sent to the pts pharmacy for one month supply.  Pt has a follow-up appt with Dr Delton See next Monday 8/22 at 3pm.  Advised the pt that I will call the original order for Benicar into the pts pharmacy of choice, and send Dr Delton See a message to make her aware that the pt cannot tolerate the samples that were given to her last week.  Advised the pt to increase her fluid intake for today only, and reassess her BP in the morning.  Advised the pt that if her BP is still low and she is feeling weak and tired, to hold the original Benicar until symptoms improve.  Advised the pt then, she could proceed on Saturday with taking her old regimen of Benicar 20 mg po daily.  Pt and Husband both verbalized understanding and agrees with this plan.

## 2014-11-28 NOTE — Telephone Encounter (Signed)
Follow up       Calling to give different phone numbers.  Still waiting for the nurse to call her back

## 2014-11-28 NOTE — Telephone Encounter (Signed)
Pt c/o medication issue:  1. Name of Medication: Benicar   2. How are you currently taking this medication (dosage and times per day)? 20 mg 1x per day  3. Are you having a reaction (difficulty breathing--STAT)?   4. What is your medication issue? Pt's husband calling stating that pt's BP is 97/63, and is getting light headed, weak

## 2014-11-28 NOTE — Telephone Encounter (Signed)
I totally agree, I just tried to help with samples, please send refills to her pharmacy

## 2014-11-29 NOTE — Telephone Encounter (Signed)
Informed the pt and spouse over their confirmed VM that per Dr Delton See she agrees with the plan mentioned below, and we will see the pt next week at her follow-up appt.  Left detailed message for the pt or spouse to call back today with any additional questions or concerns.

## 2014-12-02 ENCOUNTER — Ambulatory Visit (INDEPENDENT_AMBULATORY_CARE_PROVIDER_SITE_OTHER): Payer: BLUE CROSS/BLUE SHIELD | Admitting: Cardiology

## 2014-12-02 ENCOUNTER — Encounter: Payer: Self-pay | Admitting: Cardiology

## 2014-12-02 VITALS — BP 116/70 | HR 70 | Ht 64.0 in | Wt 146.0 lb

## 2014-12-02 DIAGNOSIS — I10 Essential (primary) hypertension: Secondary | ICD-10-CM

## 2014-12-02 DIAGNOSIS — I5022 Chronic systolic (congestive) heart failure: Secondary | ICD-10-CM | POA: Diagnosis not present

## 2014-12-02 DIAGNOSIS — R002 Palpitations: Secondary | ICD-10-CM

## 2014-12-02 NOTE — Progress Notes (Signed)
Patient ID: [name], female   DOB: Jun 22, 1955, 59 y.o.   MRN: [id number]    Primary Care Provider: No primary provider on file.  Primary Cardiologist: Lars Masson  Problem List  Past Medical History   Diagnosis  Date   .  Osteoporosis     Past Surgical History   Procedure  Laterality  Date   .  Vaginal hysterectomy     Allergies  Allergies   Allergen  Reactions   .  Cefdinir     Chief complain: Fatigue  HPI   59 year old female with h/o gastric reflux and palpitations and significant family history of coronary artery disease was coming with concerns of shortness of breath and palpitations. The patient states that her palpitations started years ago and she underwent Holter monitoring that showed frequent arrhythmia that she cannot specify further but at the time it was recommended that no treatment is necessary. She states that over time her palpitations got worse and are happening almost daily especially when laying in bed and on her left side makes her feel short of breath with improvement when laying on her back. The patient states that one she stopped eating chocolate and fiber bars her symptoms have improved but are still persistent. She states that they last seconds to minutes and are not associated with dizziness.  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 She walks on a regular bases with her husband up to 30 minutes a day however she states at moderate exertion she gets short of breath. She states that that's the way it has been for her and she was never able to run or do any moderate exercise activity.  She has significant family history of coronary artery disease her mother died of massive myocardial infarction at age of 13, there are multiple other family members with CAD. Patient states that her lipids are followed by her primary care  physician and are at goal. Her husband states that she has apneic episodes at night if she snores significantly. She also had a finding of a thyroid nodule 5 years ago that was advised to be followed.   The patient underwent echocardiography that showed left ventricular ejection fraction 30-35% with preserved left ventricular size and diffuse hypokinesis. An exercise nuclear stress test showed no ischemia and patient was started on lisinopril 5 mg daily. The patient is tolerating the medication very well she denies any cough. In fact she feels that her energy level has increased and her shortness of breath has improved minimally. She is working a 12 hour shift and feels exhausted by the end of the day feeling profoundly short of breath.  10/11/2013 - the patient has some improvement of her fatigue mostly because she is out of work and enjoying life. She denies orthopnea, lower extremity edema, paroxysmal nocturnal dyspnea, shortness of breath or chest pain. She is compliant to her medications. She describes symptoms consistent with benign positional vertigo.  12/31/2013 - The patient feels better and stronger, able to work with limited hours restriction. No LE edema, orthopnea, PND. Some fatigue and vivid dreams with lisinopril 10 mg po daily. No CP or DOE, walks daily.  03/01/2014 - the patient continues going to cardiac rehabilitation and feels significantly stronger list iron. Her shortness of breath has significantly improved on the top of days her echocardiogram has finally  showed improvement in her left ventricular ejection fraction currently 50-55%. Previously 30-35%. No lower extremity edema no orthopnea or proximal nocturnal dyspnea. The patient complains of mildly productive cough that has been going on for the last months, however it is associated with postnasal drip.  08/30/14 - the patient is coming after 6 months, in October 2015 her LVEF improved to normal values 50-55% and symptomatically she  stated that she felt like in her 60' again. She developed cough with ACEI and lisinopril was discontinued. We tried losartan that gave her significant fatigue, then switched to valsartan that is better but she still fells tired, especially after work. She completed cardiac rehab and hasn't been exercising since. She denies palpitations, syncope, SOB, CP, orthopnea or PND. No LE edema.   12/02/2014 - 3 months follow up, she feels great, working with no limitations, exercising. We tried Benicar/HCTZ and that gave her hypotension with dizziness, we changed back to benicar 20 mg po only. No PND, orthopnea, palpitations, chest pain no syncope or LE edema.  Home Medications  Prior to Admission medications   Medication  Sig  Start Date  End Date  Taking?  Authorizing Provider   ibandronate (BONIVA) 150 MG tablet   06/07/13   Yes  Historical Provider, MD   omeprazole (PRILOSEC) 20 MG capsule  Take 20 mg by mouth daily.    Yes  Historical Provider, MD   Family History  No family history on file.  Social History  History    Social History   .  Marital Status:  Married     Spouse Name:  N/A     Number of Children:  N/A   .  Years of Education:  N/A    Occupational History   .  Not on file.    Social History Main Topics   .  Smoking status:  Never Smoker   .  Smokeless tobacco:  Not on file   .  Alcohol Use:  No   .  Drug Use:  No   .  Sexual Activity:  Not on file    Other Topics  Concern   .  Not on file    Social History Narrative   .  No narrative on file   Review of Systems, as per HPI, otherwise negative  General: No chills, fever, night sweats or weight changes.  Cardiovascular: No chest pain, dyspnea on exertion, edema, orthopnea, palpitations, paroxysmal nocturnal dyspnea.  Dermatological: No rash, lesions/masses  Respiratory: No cough, dyspnea  Urologic: No hematuria, dysuria  Abdominal: No nausea, vomiting, diarrhea, bright red blood per rectum, melena, or hematemesis    Neurologic: No visual changes, wkns, changes in mental status.  All other systems reviewed and are otherwise negative except as noted above.  Physical Exam  Blood pressure 156/83, height 5\' 5"  (1.651 m), weight 142 lb (64.411 kg).  General: Pleasant, NAD  Psych: Normal affect.  Neuro: Alert and oriented X 3. Moves all extremities spontaneously.  HEENT: Normal  Neck: Supple without bruits or JVD.  Lungs: Resp regular and unlabored, CTA.  Heart: RRR no s3, s4, or murmurs.  Abdomen: Soft, non-tender, non-distended, BS + x 4.  Extremities: No clubbing, cyanosis or edema. DP/PT/Radials 2+ and equal bilaterally.  Labs:  No results found for this basename: CKTOTAL, CKMB, TROPONINI, in the last 72 hours  No results found for this basename: WBC, HGB, HCT, MCV, PLT   No results found for this basename: NA, K, CL, CO2, BUN, CREATININE, CALCIUM,  LABALBU, PROT, BILITOT, ALKPHOS, ALT, AST, GLUCOSE, in the last 168 hours  No results found for this basename: CHOL, HDL, LDLCALC, TRIG    No results found for this basename: DDIMER   No components found with this basename: POCBNP,   Accessory Clinical Findings   Echocardiogram - 07/31/2013  - Left ventricle: The cavity size was normal. Wall thickness was normal. Systolic function was moderately to severely reduced. The estimated ejection fraction was in the range of 30% to 35%. Diffuse hypokinesis. Doppler parameters are consistent with abnormal left ventricular relaxation (grade 1 diastolic dysfunction). - Aortic valve: There was no stenosis. Trivial regurgitation. - Mitral valve: Mildly calcified annulus. Mildly calcified leaflets . Trivial regurgitation. - Left atrium: The atrium was mildly dilated. - Right ventricle: The cavity size was normal. Systolic function was normal. - Pulmonary arteries: No complete TR doppler jet so unable to estimate PA systolic pressure. - Inferior vena cava: The vessel was normal in size; the respirophasic  diameter changes were in the normal range (= 50%); findings are consistent with normal central venous pressure. Impressions:  - Normal LV size with global hypokinesis, EF 30-35%. Normal RV size and systolic function. No significant valvular abnormalities.   ECG - normal sinus rhythm, 65 beats per minute normal EKG.   Exercise nuclear stress test 08/23/2013  Impression  Exercise Capacity: Good exercise capacity.  BP Response: Normal blood pressure response.  Clinical Symptoms: There is dyspnea.  ECG Impression: No significant ST segment change suggestive of ischemia.  Comparison with Prior Nuclear Study: No images to compare  Overall Impression: Low risk stress nuclear study with no significant areas of ischemia identified..  LV Ejection Fraction: Study not gated.  Donato Schultz, MD  Echocardiogram : 02/06/2014   - Procedure narrative: Limited study for LV function. - Left ventricle: The cavity size was mildly dilated. Wall thickness was normal. Systolic function was normal. The estimated ejection fraction was in the range of 50% to 55%. - Ventricular septum: Incoordinate distal septal motion.  Impressions:  - Compared to the prior echo in 11/2013, the EF has improved from 35-40% up to 50-55%.  TTE: 11/22/14 - Left ventricle: The cavity size was normal. Systolic function was normal. The estimated ejection fraction was in the range of 50% to 55%. There is severe hypokinesis of the entireinferior myocardium. There was an increased relative contribution of atrial contraction to ventricular filling. Doppler parameters are consistent with abnormal left ventricular relaxation (grade 1 diastolic dysfunction). - Aortic valve: Trileaflet; normal thickness, mildly calcified leaflets. There was trivial regurgitation. - Left atrium: The atrium was mildly dilated.    Assessment & Plan   Very pleasant 59 year old female   1. Systolic CHF, NYHA class II,  non-ischemic cardiomyopathy, ejection fraction 30-35%, improved to 50-55%, stable on th emost recent echo from 11/22/2014.  Cough with ACEI, fatigue with losartan and valsartan,  Benicar - olmesartan 20 mg po daily works great for her. She won't tolerate higher dose.   2. Palpitations -  48 hour Holter monitor showed only frequent PVCs and PACs.  3. Sleep apnea - might be cause of her palpitations we will refer for sleep study   4. Hypertension - controlled  5. History of thyroid nodule - Complex cysts and small nodules in both lobes are noted. Within the right lobe common complex cyst and small nodule are slightly larger  but remain be lobe criteria for fine needle aspiration biopsy.  Findings do not meet current SRU consensus criteria for biopsy.  6. Lipids - at goal and no evidence of coronary artery disease  Followup in 6 months, BMP and TSH today.   Lars Masson, MD, [facility]  12/02/2014

## 2014-12-02 NOTE — Patient Instructions (Signed)
Medication Instructions:  Your physician recommends that you continue on your current medications as directed. Please refer to the Current Medication list given to you today.   Labwork: Bmet, Tsh Today  Testing/Procedures: None   Follow-Up: Your physician wants you to follow-up in: 6 months with Dr.[name] You will receive a reminder letter in the mail two months in advance. If you don't receive a letter, please call our office to schedule the follow-up appointment.   Any Other Special Instructions Will Be Listed Below (If Applicable).

## 2014-12-03 LAB — BASIC METABOLIC PANEL
BUN: 15 mg/dL (ref 6–23)
CO2: 27 mEq/L (ref 19–32)
Calcium: 9.1 mg/dL (ref 8.4–10.5)
Chloride: 106 mEq/L (ref 96–112)
Creatinine, Ser: 0.86 mg/dL (ref 0.40–1.20)
GFR: 71.4 mL/min (ref >60.00)
Glucose, Bld: 95 mg/dL (ref 70–99)
Potassium: 3.9 mEq/L (ref 3.5–5.1)
Sodium: 141 mEq/L (ref 135–145)

## 2014-12-03 LAB — TSH: TSH: 1.91 u[IU]/mL (ref 0.35–4.50)

## 2015-04-16 ENCOUNTER — Telehealth: Payer: Self-pay | Admitting: Cardiology

## 2015-04-16 MED ORDER — OLMESARTAN MEDOXOMIL 20 MG PO TABS: 20.0000 mg | ORAL_TABLET | Freq: Every day | ORAL | Status: DC

## 2015-04-16 NOTE — Telephone Encounter (Signed)
New message      Pt c/o medication issue:  1. Name of Medication: olmesartan  2. How are you currently taking this medication (dosage and times per day)? 20mg  3. Are you having a reaction (difficulty breathing--STAT)? no  4. What is your medication issue? Pt had to switch to the generic benicar.  Pt states she did better with benacar.  She is now feeling palpitations on the generic medication.  She want to switch back to the benacar if possible.  Please send presc for benicar to [facility] in [place].

## 2015-04-16 NOTE — Telephone Encounter (Signed)
Please restart benicar

## 2015-04-16 NOTE — Telephone Encounter (Signed)
Informed the pt that I phoned in her request to change back to Brand Benicar to her pharmacy of choice.  Informed the pt that I called in only a one month supply, for we need to make sure that the brand name Benicar resolves her complaints of palpitations caused by generic Benicar. Pt states she is completely fine with this, and will pay out of pocket for the cost of Brand name Benicar vs generic form.  Pt verbalized understanding and agrees with this plan.  Pt gracious for all the assistance provided.

## 2015-04-16 NOTE — Telephone Encounter (Signed)
Will route this message to Dr Delton See for further review, and to receive the order to switch the pt back to regular benicar as requested, and follow-up with the pt thereafter.

## 2015-04-17 ENCOUNTER — Telehealth: Payer: Self-pay | Admitting: Cardiology

## 2015-04-17 NOTE — Telephone Encounter (Signed)
Patient calling the office for samples of medication:   1.  What medication and dosage are you requesting samples for? [name] 20mg   2.  Are you currently out of this medication? Yes and can't get any until the 14th  If pt doesn't answer home please call cell [phone]

## 2015-04-17 NOTE — Telephone Encounter (Signed)
Called pt to inform her that we did not have any other the Benicar 20 mg tablets samples and that I had contacted her pharmacy and they stated that she could get enough to last until her insurance will pay for the medication. I advised the pt that if she has any other problems, questions or concerns to call the office. Pt verbalized understanding.

## 2015-06-16 ENCOUNTER — Ambulatory Visit (INDEPENDENT_AMBULATORY_CARE_PROVIDER_SITE_OTHER): Payer: BLUE CROSS/BLUE SHIELD | Admitting: Cardiology

## 2015-06-16 ENCOUNTER — Encounter: Payer: Self-pay | Admitting: Cardiology

## 2015-06-16 VITALS — BP 124/74 | HR 64 | Ht 64.0 in | Wt 151.0 lb

## 2015-06-16 DIAGNOSIS — I5022 Chronic systolic (congestive) heart failure: Secondary | ICD-10-CM

## 2015-06-16 DIAGNOSIS — I429 Cardiomyopathy, unspecified: Secondary | ICD-10-CM | POA: Diagnosis not present

## 2015-06-16 DIAGNOSIS — I10 Essential (primary) hypertension: Secondary | ICD-10-CM | POA: Diagnosis not present

## 2015-06-16 DIAGNOSIS — I420 Dilated cardiomyopathy: Secondary | ICD-10-CM

## 2015-06-16 NOTE — Patient Instructions (Signed)
Medication Instructions:   Your physician recommends that you continue on your current medications as directed. Please refer to the Current Medication list given to you today.    Labwork:  PRIOR TO YOUR 6 MONTH FOLLOW-UP APPOINTMENT WITH DR [name]---CMET, CBC W DIFF, TSH AND LIPIDS--PLEASE COME FASTING TO THIS LAB APPOINTMENT    Follow-Up:  Your physician wants you to follow-up in: 6 MONTHS WITH DR Johnell Comings will receive a reminder letter in the mail two months in advance. If you don't receive a letter, please call our office to schedule the follow-up appointment.  PLEASE HAVE YOUR LABS DONE PRIOR TO THIS APPOINTMENT     If you need a refill on your cardiac medications before your next appointment, please call your pharmacy.

## 2015-06-16 NOTE — Progress Notes (Signed)
Patient ID: [name], female   DOB: 01-16-1956, 60 y.o.   MRN: [id number]    Primary Care Provider: No primary provider on file.  Primary Cardiologist: [name], [name]   Chief complain: Fatigue  HPI   60 year old female with [name]/o gastric reflux and palpitations and significant family history of coronary artery disease was coming with concerns of shortness of breath and palpitations. The patient states that her palpitations started years ago and she underwent Holter monitoring that showed frequent arrhythmia that she cannot specify further but at the time it was recommended that no treatment is necessary. She states that over time her palpitations got worse and are happening almost daily especially when laying in bed and on her left side makes her feel short of breath with improvement when laying on her back. The patient states that one she stopped eating chocolate and fiber bars her symptoms have improved but are still persistent. She states that they last seconds to minutes and are not associated with dizziness.  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 She walks on a regular bases with her husband up to 30 minutes a day however she states at moderate exertion she gets short of breath. She states that that's the way it has been for her and she was never able to run or do any moderate exercise activity.  She has significant family history of coronary artery disease her mother died of massive myocardial infarction at age of 61, there are multiple other family members with CAD. Patient states that her lipids are followed by her primary care physician and are at goal. Her husband states that she has apneic episodes at night if she snores significantly. She also had a finding of a thyroid nodule 5 years ago that was advised to be followed.   The patient underwent  echocardiography that showed left ventricular ejection fraction 30-35% with preserved left ventricular size and diffuse hypokinesis. An exercise nuclear stress test showed no ischemia and patient was started on lisinopril 5 mg daily. The patient is tolerating the medication very well she denies any cough. In fact she feels that her energy level has increased and her shortness of breath has improved minimally. She is working a 12 hour shift and feels exhausted by the end of the day feeling profoundly short of breath.  10/11/2013 - the patient has some improvement of her fatigue mostly because she is out of work and enjoying life. She denies orthopnea, lower extremity edema, paroxysmal nocturnal dyspnea, shortness of breath or chest pain. She is compliant to her medications. She describes symptoms consistent with benign positional vertigo.  12/31/2013 - The patient feels better and stronger, able to work with limited hours restriction. No LE edema, orthopnea, PND. Some fatigue and vivid dreams with lisinopril 10 mg po daily. No CP or DOE, walks daily.  03/01/2014 - the patient continues going to cardiac rehabilitation and feels significantly stronger list iron. Her shortness of breath has significantly improved on the top of days her echocardiogram has finally showed improvement in her left ventricular ejection fraction currently 50-55%. Previously 30-35%. No lower extremity edema no orthopnea or proximal nocturnal dyspnea. The patient complains of mildly productive cough that has been going on for the last months, however it is associated with postnasal drip.  08/30/14 - the patient is coming after 6 months, in  October 2015 her LVEF improved to normal values 50-55% and symptomatically she stated that she felt like in her 60' again. She developed cough with ACEI and lisinopril was discontinued. We tried losartan that gave her significant fatigue, then switched to valsartan that is better but she still fells  tired, especially after work. She completed cardiac rehab and hasn't been exercising since. She denies palpitations, syncope, SOB, CP, orthopnea or PND. No LE edema.   06/16/2015 - 6 months follow up, she feels great, working with no limitations, exercising. We tried Benicar/HCTZ and that gave her hypotension with dizziness, we changed back to benicar 20 mg po only. She tried generic form of Oma start on but give her palpitations so she switched back to Benicar. She is able to work 12 hour shifts and even walk 30 steps at a time without significant dyspnea on exertion. No palpitations or syncope.   Home Medications  Prior to Admission medications   Medication  Sig  Start Date  End Date  Taking?  Authorizing Provider   ibandronate (BONIVA) 150 MG tablet   06/07/13   Yes  Historical Provider, MD   omeprazole (PRILOSEC) 20 MG capsule  Take 20 mg by mouth daily.    Yes  Historical Provider, MD   Family History  No family history on file.  Social History  History    Social History   .  Marital Status:  Married     Spouse Name:  N/A     Number of Children:  N/A   .  Years of Education:  N/A    Occupational History   .  Not on file.    Social History Main Topics   .  Smoking status:  Never Smoker   .  Smokeless tobacco:  Not on file   .  Alcohol Use:  No   .  Drug Use:  No   .  Sexual Activity:  Not on file    Other Topics  Concern   .  Not on file    Social History Narrative   .  No narrative on file   Review of Systems, as per HPI, otherwise negative  General: No chills, fever, night sweats or weight changes.  Cardiovascular: No chest pain, dyspnea on exertion, edema, orthopnea, palpitations, paroxysmal nocturnal dyspnea.  Dermatological: No rash, lesions/masses  Respiratory: No cough, dyspnea  Urologic: No hematuria, dysuria  Abdominal: No nausea, vomiting, diarrhea, bright red blood per rectum, melena, or hematemesis  Neurologic: No visual changes, wkns, changes in mental  status.  All other systems reviewed and are otherwise negative except as noted above.  Physical Exam  Blood pressure 156/83, height  (1.651 m), weight 142 lb (64.411 kg).  General: Pleasant, NAD  Psych: Normal affect.  Neuro: Alert and oriented X 3. Moves all extremities spontaneously.  HEENT: Normal  Neck: Supple without bruits or JVD.  Lungs: Resp regular and unlabored, CTA.  Heart: RRR no s3, s4, or murmurs.  Abdomen: Soft, non-tender, non-distended, BS + x 4.  Extremities: No clubbing, cyanosis or edema. DP/PT/Radials 2+ and equal bilaterally.  Labs:  No results found for this basename: CKTOTAL, CKMB, TROPONINI, in the last 72 hours  No results found for this basename: WBC, HGB, HCT, MCV, PLT   No results found for this basename: NA, K, CL, CO2, BUN, CREATININE, CALCIUM, LABALBU, PROT, BILITOT, ALKPHOS, ALT, AST, GLUCOSE, in the last 168 hours  No results found for this basename: CHOL, HDL, LDLCALC, TRIG  No results found for this basename: DDIMER   No components found with this basename: POCBNP,   Accessory Clinical Findings   Echocardiogram - 07/31/2013  - Left ventricle: The cavity size was normal. Wall thickness was normal. Systolic function was moderately to severely reduced. The estimated ejection fraction was in the range of 30% to 35%. Diffuse hypokinesis. Doppler parameters are consistent with abnormal left ventricular relaxation (grade 1 diastolic dysfunction). - Aortic valve: There was no stenosis. Trivial regurgitation. - Mitral valve: Mildly calcified annulus. Mildly calcified leaflets . Trivial regurgitation. - Left atrium: The atrium was mildly dilated. - Right ventricle: The cavity size was normal. Systolic function was normal. - Pulmonary arteries: No complete TR doppler jet so unable to estimate PA systolic pressure. - Inferior vena cava: The vessel was normal in size; the respirophasic diameter changes were in the normal range (= 50%); findings  are consistent with normal central venous pressure. Impressions:  - Normal LV size with global hypokinesis, EF 30-35%. Normal RV size and systolic function. No significant valvular abnormalities.   ECG - normal sinus rhythm, 65 beats per minute normal EKG.   Exercise nuclear stress test 08/23/2013  Impression  Exercise Capacity: Good exercise capacity.  BP Response: Normal blood pressure response.  Clinical Symptoms: There is dyspnea.  ECG Impression: No significant ST segment change suggestive of ischemia.  Comparison with Prior Nuclear Study: No images to compare  Overall Impression: Low risk stress nuclear study with no significant areas of ischemia identified..  LV Ejection Fraction: Study not gated.  Donato Schultz, MD  Echocardiogram : 02/06/2014   - Procedure narrative: Limited study for LV function. - Left ventricle: The cavity size was mildly dilated. Wall thickness was normal. Systolic function was normal. The estimated ejection fraction was in the range of 50% to 55%. - Ventricular septum: Incoordinate distal septal motion.  Impressions:  - Compared to the prior echo in 11/2013, the EF has improved from 35-40% up to 50-55%.  TTE: 11/22/14 - Left ventricle: The cavity size was normal. Systolic function was normal. The estimated ejection fraction was in the range of 50% to 55%. There is severe hypokinesis of the entireinferior myocardium. There was an increased relative contribution of atrial contraction to ventricular filling. Doppler parameters are consistent with abnormal left ventricular relaxation (grade 1 diastolic dysfunction). - Aortic valve: Trileaflet; normal thickness, mildly calcified leaflets. There was trivial regurgitation. - Left atrium: The atrium was mildly dilated.    Assessment & Plan   Very pleasant 60 year old female   1. Systolic CHF, NYHA class II, non-ischemic cardiomyopathy, ejection fraction 30-35%, improved to  50-55%, stable on th emost recent echo from 11/22/2014.  Cough with ACEI, fatigue with losartan and valsartan,  Benicar - olmesartan 20 mg po daily works great for her. She won't tolerate higher dose.   2. Palpitations -  48 hour Holter monitor showed only frequent PVCs and PACs.  3. Sleep apnea - might be cause of her palpitations we will refer for sleep study   4. Hypertension - controlled  5. History of thyroid nodule - Complex cysts and small nodules in both lobes are noted. Within the right lobe common complex cyst and small nodule are slightly larger  but remain be lobe criteria for fine needle aspiration biopsy.  Findings do not meet current SRU consensus criteria for biopsy.  6. Lipids - at goal and no evidence of coronary artery disease  Followup in 6 months, CMP, CBC, TSH and lipids prior to next visit.  Bridget Masson, MD, [facility]  06/16/2015

## 2015-06-17 ENCOUNTER — Other Ambulatory Visit: Payer: Self-pay

## 2015-06-17 MED ORDER — OLMESARTAN MEDOXOMIL 20 MG PO TABS: 20.0000 mg | ORAL_TABLET | Freq: Every day | ORAL | Status: DC

## 2015-06-20 ENCOUNTER — Other Ambulatory Visit: Payer: Self-pay | Admitting: *Deleted

## 2015-06-20 MED ORDER — BENICAR 20 MG PO TABS: 20.0000 mg | ORAL_TABLET | Freq: Every day | ORAL | Status: DC

## 2015-12-03 ENCOUNTER — Encounter: Payer: Self-pay | Admitting: *Deleted

## 2015-12-17 ENCOUNTER — Ambulatory Visit (INDEPENDENT_AMBULATORY_CARE_PROVIDER_SITE_OTHER): Payer: BLUE CROSS/BLUE SHIELD | Admitting: Cardiology

## 2015-12-17 ENCOUNTER — Encounter (INDEPENDENT_AMBULATORY_CARE_PROVIDER_SITE_OTHER): Payer: Self-pay

## 2015-12-17 ENCOUNTER — Encounter: Payer: Self-pay | Admitting: Cardiology

## 2015-12-17 VITALS — BP 122/80 | HR 55 | Ht 64.0 in | Wt 148.0 lb

## 2015-12-17 DIAGNOSIS — I428 Other cardiomyopathies: Secondary | ICD-10-CM

## 2015-12-17 DIAGNOSIS — I420 Dilated cardiomyopathy: Secondary | ICD-10-CM

## 2015-12-17 DIAGNOSIS — I5022 Chronic systolic (congestive) heart failure: Secondary | ICD-10-CM | POA: Diagnosis not present

## 2015-12-17 DIAGNOSIS — I429 Cardiomyopathy, unspecified: Secondary | ICD-10-CM | POA: Diagnosis not present

## 2015-12-17 DIAGNOSIS — R002 Palpitations: Secondary | ICD-10-CM

## 2015-12-17 DIAGNOSIS — I10 Essential (primary) hypertension: Secondary | ICD-10-CM

## 2015-12-17 NOTE — Patient Instructions (Signed)

## 2015-12-17 NOTE — Progress Notes (Signed)
Patient ID: [name], female   DOB: 01-16-1956, 60 y.o.   MRN: [id number]    Primary Care Provider: No primary provider on file.  Primary Cardiologist: Lars MassonNELSON, Nayana Lenig H   Chief complain: Fatigue  HPI   60 year old female with h/o gastric reflux and palpitations and significant family history of coronary artery disease was coming with concerns of shortness of breath and palpitations. The patient states that her palpitations started years ago and she underwent Holter monitoring that showed frequent arrhythmia that she cannot specify further but at the time it was recommended that no treatment is necessary. She states that over time her palpitations got worse and are happening almost daily especially when laying in bed and on her left side makes her feel short of breath with improvement when laying on her back. The patient states that one she stopped eating chocolate and fiber bars her symptoms have improved but are still persistent. She states that they last seconds to minutes and are not associated with dizziness.  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 She walks on a regular bases with her husband up to 30 minutes a day however she states at moderate exertion she gets short of breath. She states that that's the way it has been for her and she was never able to run or do any moderate exercise activity.  She has significant family history of coronary artery disease her mother died of massive myocardial infarction at age of 61, there are multiple other family members with CAD. Patient states that her lipids are followed by her primary care physician and are at goal. Her husband states that she has apneic episodes at night if she snores significantly. She also had a finding of a thyroid nodule 5 years ago that was advised to be followed.   The patient underwent  echocardiography that showed left ventricular ejection fraction 30-35% with preserved left ventricular size and diffuse hypokinesis. An exercise nuclear stress test showed no ischemia and patient was started on lisinopril 5 mg daily. The patient is tolerating the medication very well she denies any cough. In fact she feels that her energy level has increased and her shortness of breath has improved minimally. She is working a 12 hour shift and feels exhausted by the end of the day feeling profoundly short of breath.  10/11/2013 - the patient has some improvement of her fatigue mostly because she is out of work and enjoying life. She denies orthopnea, lower extremity edema, paroxysmal nocturnal dyspnea, shortness of breath or chest pain. She is compliant to her medications. She describes symptoms consistent with benign positional vertigo.  12/31/2013 - The patient feels better and stronger, able to work with limited hours restriction. No LE edema, orthopnea, PND. Some fatigue and vivid dreams with lisinopril 10 mg po daily. No CP or DOE, walks daily.  03/01/2014 - the patient continues going to cardiac rehabilitation and feels significantly stronger list iron. Her shortness of breath has significantly improved on the top of days her echocardiogram has finally showed improvement in her left ventricular ejection fraction currently 50-55%. Previously 30-35%. No lower extremity edema no orthopnea or proximal nocturnal dyspnea. The patient complains of mildly productive cough that has been going on for the last months, however it is associated with postnasal drip.  08/30/14 - the patient is coming after 6 months, in  October 2015 her LVEF improved to normal values 50-55% and symptomatically she stated that she felt like in her 30' again. She developed cough with ACEI and lisinopril was discontinued. We tried losartan that gave her significant fatigue, then switched to valsartan that is better but she still fells  tired, especially after work. She completed cardiac rehab and hasn't been exercising since. She denies palpitations, syncope, SOB, CP, orthopnea or PND. No LE edema.   12/17/2015 - 6 months follow up, she feels great, working with no limitations, exercising. She feels tired after 12 hour shifts and has no energy to exercise. She is tolerating olmesartan well. NO CP, DOE, LE edema, palpitations or syncope. She continues to work and is planning to do so for at least another 6 months.  Home Medications  Prior to Admission medications   Medication  Sig  Start Date  End Date  Taking?  Authorizing Provider   ibandronate (BONIVA) 150 MG tablet   06/07/13   Yes  Historical Provider, MD   omeprazole (PRILOSEC) 20 MG capsule  Take 20 mg by mouth daily.    Yes  Historical Provider, MD   Family History  No family history on file.  Social History  History    Social History   .  Marital Status:  Married     Spouse Name:  N/A     Number of Children:  N/A   .  Years of Education:  N/A    Occupational History   .  Not on file.    Social History Main Topics   .  Smoking status:  Never Smoker   .  Smokeless tobacco:  Not on file   .  Alcohol Use:  No   .  Drug Use:  No   .  Sexual Activity:  Not on file    Other Topics  Concern   .  Not on file    Social History Narrative   .  No narrative on file    Review of Systems, as per HPI, otherwise negative  General: No chills, fever, night sweats or weight changes.  Cardiovascular: No chest pain, dyspnea on exertion, edema, orthopnea, palpitations, paroxysmal nocturnal dyspnea.  Dermatological: No rash, lesions/masses  Respiratory: No cough, dyspnea  Urologic: No hematuria, dysuria  Abdominal: No nausea, vomiting, diarrhea, bright red blood per rectum, melena, or hematemesis  Neurologic: No visual changes, wkns, changes in mental status.  All other systems reviewed and are otherwise negative except as noted above.  Physical Exam  Blood pressure  156/83, height 5\' 5"  (1.651 m), weight 142 lb (64.411 kg).  General: Pleasant, NAD  Psych: Normal affect.  Neuro: Alert and oriented X 3. Moves all extremities spontaneously.  HEENT: Normal  Neck: Supple without bruits or JVD.  Lungs: Resp regular and unlabored, CTA.  Heart: RRR no s3, s4, or murmurs.  Abdomen: Soft, non-tender, non-distended, BS + x 4.  Extremities: No clubbing, cyanosis or edema. DP/PT/Radials 2+ and equal bilaterally.  Labs:  No results found for this basename: CKTOTAL, CKMB, TROPONINI, in the last 72 hours  No results found for this basename: WBC, HGB, HCT, MCV, PLT   No results found for this basename: NA, K, CL, CO2, BUN, CREATININE, CALCIUM, LABALBU, PROT, BILITOT, ALKPHOS, ALT, AST, GLUCOSE, in the last 168 hours  No results found for this basename: CHOL, HDL, LDLCALC, TRIG    No results found for this basename: DDIMER   No components found with this basename: POCBNP,  Accessory Clinical Findings   Echocardiogram - 07/31/2013  - Left ventricle: The cavity size was normal. Wall thickness was normal. Systolic function was moderately to severely reduced. The estimated ejection fraction was in the range of 30% to 35%. Diffuse hypokinesis. Doppler parameters are consistent with abnormal left ventricular relaxation (grade 1 diastolic dysfunction). - Aortic valve: There was no stenosis. Trivial regurgitation. - Mitral valve: Mildly calcified annulus. Mildly calcified leaflets . Trivial regurgitation. - Left atrium: The atrium was mildly dilated. - Right ventricle: The cavity size was normal. Systolic function was normal. - Pulmonary arteries: No complete TR doppler jet so unable to estimate PA systolic pressure. - Inferior vena cava: The vessel was normal in size; the respirophasic diameter changes were in the normal range (= 50%); findings are consistent with normal central venous pressure. Impressions:  - Normal LV size with global hypokinesis, EF  30-35%. Normal RV size and systolic function. No significant valvular abnormalities.   ECG - normal sinus rhythm, 65 beats per minute normal EKG.   Exercise nuclear stress test 08/23/2013  Impression  Exercise Capacity: Good exercise capacity.  BP Response: Normal blood pressure response.  Clinical Symptoms: There is dyspnea.  ECG Impression: No significant ST segment change suggestive of ischemia.  Comparison with Prior Nuclear Study: No images to compare  Overall Impression: Low risk stress nuclear study with no significant areas of ischemia identified..  LV Ejection Fraction: Study not gated.  Donato Schultz, MD  Echocardiogram : 02/06/2014   - Procedure narrative: Limited study for LV function. - Left ventricle: The cavity size was mildly dilated. Wall thickness was normal. Systolic function was normal. The estimated ejection fraction was in the range of 50% to 55%. - Ventricular septum: Incoordinate distal septal motion.  Impressions:  - Compared to the prior echo in 11/2013, the EF has improved from 35-40% up to 50-55%.  TTE: 11/22/14 - Left ventricle: The cavity size was normal. Systolic function was normal. The estimated ejection fraction was in the range of 50% to 55%. There is severe hypokinesis of the entireinferior myocardium. There was an increased relative contribution of atrial contraction to ventricular filling. Doppler parameters are consistent with abnormal left ventricular relaxation (grade 1 diastolic dysfunction). - Aortic valve: Trileaflet; normal thickness, mildly calcified leaflets. There was trivial regurgitation. - Left atrium: The atrium was mildly dilated.    Assessment & Plan   Very pleasant 60 year old female   1. Systolic CHF, NYHA class I, non-ischemic cardiomyopathy, ejection fraction 30-35%, improved to 50-55%, stable on th emost recent echo from 11/22/2014.  Cough with ACEI, fatigue with losartan and valsartan,  Benicar  - olmesartan 20 mg po daily works great for her.   2. Palpitations -  48 hour Holter monitor showed only frequent PVCs and PACs. Now improved. No h/o syncope.  3. Sleep apnea - results unavailable we'll request.  4. Hypertension - controlled  5. Lipids - at goal and no evidence of coronary artery disease  Followup in 6 months, CMP, CBC, TSH and lipids scheduled for next week per her insurance.   Lars Masson, MD, [facility]  12/17/2015

## 2015-12-24 ENCOUNTER — Other Ambulatory Visit: Payer: BLUE CROSS/BLUE SHIELD | Admitting: *Deleted

## 2015-12-24 DIAGNOSIS — I420 Dilated cardiomyopathy: Secondary | ICD-10-CM

## 2015-12-24 DIAGNOSIS — I5022 Chronic systolic (congestive) heart failure: Secondary | ICD-10-CM

## 2015-12-24 DIAGNOSIS — I10 Essential (primary) hypertension: Secondary | ICD-10-CM

## 2015-12-24 LAB — LIPID PANEL
Cholesterol: 180 mg/dL (ref 125–200)
HDL: 68 mg/dL (ref >=46)
LDL Cholesterol: 96 mg/dL (ref <130)
Total CHOL/HDL Ratio: 2.6 Ratio (ref <=5.0)
Triglycerides: 78 mg/dL (ref <150)
VLDL: 16 mg/dL (ref <30)

## 2015-12-24 LAB — COMPREHENSIVE METABOLIC PANEL
ALT: 11 U/L (ref 6–29)
AST: 15 U/L (ref 10–35)
Albumin: 4 g/dL (ref 3.6–5.1)
Alkaline Phosphatase: 51 U/L (ref 33–130)
BUN: 14 mg/dL (ref 7–25)
CO2: 26 mmol/L (ref 20–31)
Calcium: 9.3 mg/dL (ref 8.6–10.4)
Chloride: 106 mmol/L (ref 98–110)
Creat: 0.89 mg/dL (ref 0.50–0.99)
Glucose, Bld: 85 mg/dL (ref 65–99)
Potassium: 4.1 mmol/L (ref 3.5–5.3)
Sodium: 141 mmol/L (ref 135–146)
Total Bilirubin: 1.1 mg/dL (ref 0.2–1.2)
Total Protein: 7 g/dL (ref 6.1–8.1)

## 2015-12-24 LAB — CBC WITH DIFFERENTIAL/PLATELET
Basophils Absolute: 0 cells/uL (ref 0–200)
Basophils Relative: 0 %
Eosinophils Absolute: 124 cells/uL (ref 15–500)
Eosinophils Relative: 2 %
HCT: 35.4 % (ref 35.0–45.0)
Hemoglobin: 12.1 g/dL (ref 11.7–15.5)
Lymphocytes Relative: 32 %
Lymphs Abs: 1984 cells/uL (ref 850–3900)
MCH: 31.5 pg (ref 27.0–33.0)
MCHC: 34.2 g/dL (ref 32.0–36.0)
MCV: 92.2 fL (ref 80.0–100.0)
MPV: 9.9 fL (ref 7.5–12.5)
Monocytes Absolute: 434 cells/uL (ref 200–950)
Monocytes Relative: 7 %
Neutro Abs: 3658 cells/uL (ref 1500–7800)
Neutrophils Relative %: 59 %
Platelets: 279 10*3/uL (ref 140–400)
RBC: 3.84 MIL/uL (ref 3.80–5.10)
RDW: 12.2 % (ref 11.0–15.0)
WBC: 6.2 10*3/uL (ref 3.8–10.8)

## 2015-12-24 LAB — TSH: TSH: 1.91 mIU/L

## 2016-04-19 ENCOUNTER — Other Ambulatory Visit: Payer: Self-pay | Admitting: Obstetrics and Gynecology

## 2016-04-19 DIAGNOSIS — M810 Age-related osteoporosis without current pathological fracture: Secondary | ICD-10-CM

## 2016-04-29 ENCOUNTER — Other Ambulatory Visit: Payer: BLUE CROSS/BLUE SHIELD

## 2016-04-30 ENCOUNTER — Telehealth: Payer: Self-pay

## 2016-04-30 NOTE — Telephone Encounter (Signed)
Prior auth submitted for [employer] to [employer]

## 2016-05-07 ENCOUNTER — Other Ambulatory Visit: Payer: Self-pay | Admitting: Family Medicine

## 2016-05-07 ENCOUNTER — Ambulatory Visit
Admission: RE | Admit: 2016-05-07 | Discharge: 2016-05-07 | Disposition: A | Discharge status: Home / Self Care | Payer: Managed Care, Other (non HMO) | Source: Ambulatory Visit | Attending: Obstetrics and Gynecology | Admitting: Obstetrics and Gynecology

## 2016-05-07 DIAGNOSIS — M810 Age-related osteoporosis without current pathological fracture: Secondary | ICD-10-CM

## 2016-05-12 ENCOUNTER — Telehealth: Payer: Self-pay | Admitting: *Deleted

## 2016-05-12 NOTE — Telephone Encounter (Signed)
Spoke with patient just now about this. I Had done a prior auth for Benicar last week on Cover My Meds. Received a fax on 05/06/2016, from [employer] saying it was denied. However, on the [facility] dashboard, it says "APPROVED'" on 05/07/2016. I have asked the patient to call the Insurance company to see what is true. She said she is happy to do this.

## 2016-05-12 NOTE — Telephone Encounter (Signed)
Appeal letter faxed to [employer].

## 2016-05-19 ENCOUNTER — Telehealth: Payer: Self-pay

## 2016-05-19 NOTE — Telephone Encounter (Signed)
Benicar approved by [employer]. Valid through 05/14/2017.

## 2016-06-18 ENCOUNTER — Encounter (INDEPENDENT_AMBULATORY_CARE_PROVIDER_SITE_OTHER): Payer: Self-pay

## 2016-06-18 ENCOUNTER — Ambulatory Visit (INDEPENDENT_AMBULATORY_CARE_PROVIDER_SITE_OTHER): Payer: Managed Care, Other (non HMO) | Admitting: Cardiology

## 2016-06-18 VITALS — BP 122/64 | HR 60 | Ht 64.0 in | Wt 150.0 lb

## 2016-06-18 DIAGNOSIS — I5022 Chronic systolic (congestive) heart failure: Secondary | ICD-10-CM

## 2016-06-18 DIAGNOSIS — R002 Palpitations: Secondary | ICD-10-CM | POA: Diagnosis not present

## 2016-06-18 DIAGNOSIS — I10 Essential (primary) hypertension: Secondary | ICD-10-CM | POA: Diagnosis not present

## 2016-06-18 DIAGNOSIS — I420 Dilated cardiomyopathy: Secondary | ICD-10-CM

## 2016-06-18 DIAGNOSIS — I429 Cardiomyopathy, unspecified: Secondary | ICD-10-CM

## 2016-06-18 NOTE — Progress Notes (Signed)
Patient ID: [name], female   DOB: 1955/06/22, 61 y.o.   MRN: [id number]    Primary Care Provider: No primary provider on file.  Primary Cardiologist: Lars Masson   Chief complain: Fatigue  HPI   61 year old female with h/o gastric reflux and palpitations and significant family history of coronary artery disease was coming with concerns of shortness of breath and palpitations. The patient states that her palpitations started years ago and she underwent Holter monitoring that showed frequent arrhythmia that she cannot specify further but at the time it was recommended that no treatment is necessary. She states that over time her palpitations got worse and are happening almost daily especially when laying in bed and on her left side makes her feel short of breath with improvement when laying on her back. The patient states that one she stopped eating chocolate and fiber bars her symptoms have improved but are still persistent. She states that they last seconds to minutes and are not associated with dizziness.  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 She walks on a regular bases with her husband up to 30 minutes a day however she states at moderate exertion she gets short of breath. She states that that's the way it has been for her and she was never able to run or do any moderate exercise activity.  She has significant family history of coronary artery disease her mother died of massive myocardial infarction at age of 20, there are multiple other family members with CAD. Patient states that her lipids are followed by her primary care physician and are at goal. Her husband states that she has apneic episodes at night if she snores significantly. She also had a finding of a thyroid nodule 5 years ago that was advised to be followed.   The patient underwent  echocardiography that showed left ventricular ejection fraction 30-35% with preserved left ventricular size and diffuse hypokinesis. An exercise nuclear stress test showed no ischemia and patient was started on lisinopril 5 mg daily. The patient is tolerating the medication very well she denies any cough. In fact she feels that her energy level has increased and her shortness of breath has improved minimally. She is working a 12 hour shift and feels exhausted by the end of the day feeling profoundly short of breath.  10/11/2013 - the patient has some improvement of her fatigue mostly because she is out of work and enjoying life. She denies orthopnea, lower extremity edema, paroxysmal nocturnal dyspnea, shortness of breath or chest pain. She is compliant to her medications. She describes symptoms consistent with benign positional vertigo.  12/31/2013 - The patient feels better and stronger, able to work with limited hours restriction. No LE edema, orthopnea, PND. Some fatigue and vivid dreams with lisinopril 10 mg po daily. No CP or DOE, walks daily.  03/01/2014 - the patient continues going to cardiac rehabilitation and feels significantly stronger list iron. Her shortness of breath has significantly improved on the top of days her echocardiogram has finally showed improvement in her left ventricular ejection fraction currently 50-55%. Previously 30-35%. No lower extremity edema no orthopnea or proximal nocturnal dyspnea. The patient complains of mildly productive cough that has been going on for the last months, however it is associated with postnasal drip.  08/30/14 - the patient is coming after 6 months, in  October 2015 her LVEF improved to normal values 50-55% and symptomatically she stated that she felt like in her 60' again. She developed cough with ACEI and lisinopril was discontinued. We tried losartan that gave her significant fatigue, then switched to valsartan that is better but she still fells  tired, especially after work. She completed cardiac rehab and hasn't been exercising since. She denies palpitations, syncope, SOB, CP, orthopnea or PND. No LE edema.   12/17/2015 - 6 months follow up, she feels great, working with no limitations, exercising. She feels tired after 12 hour shifts and has no energy to exercise. She is tolerating olmesartan well. NO CP, DOE, LE edema, palpitations or syncope. She continues to work and is planning to do so for at least another 6 months.  06/18/2016 - 6 months follow up, she is overall doing well, but feeling more tired, she thinks they maybe it is because of winter. She denies LE edema, no orthopnea, PND. No chest pain, SOB. Minimal seconds lasting palpitations. No falls. Compliant with Benicar. No side effects. She is very excited about retiring in April 2018.    Home Medications  Prior to Admission medications   Medication  Sig  Start Date  End Date  Taking?  Authorizing Provider   ibandronate (BONIVA) 150 MG tablet   06/07/13   Yes  Historical Provider, MD   omeprazole (PRILOSEC) 20 MG capsule  Take 20 mg by mouth daily.    Yes  Historical Provider, MD   Family History  No family history on file.  Social History  History    Social History   .  Marital Status:  Married     Spouse Name:  N/A     Number of Children:  N/A   .  Years of Education:  N/A    Occupational History   .  Not on file.    Social History Main Topics   .  Smoking status:  Never Smoker   .  Smokeless tobacco:  Not on file   .  Alcohol Use:  No   .  Drug Use:  No   .  Sexual Activity:  Not on file    Other Topics  Concern   .  Not on file    Social History Narrative   .  No narrative on file    Review of Systems, as per HPI, otherwise negative  General: No chills, fever, night sweats or weight changes.  Cardiovascular: No chest pain, dyspnea on exertion, edema, orthopnea, palpitations, paroxysmal nocturnal dyspnea.  Dermatological: No rash, lesions/masses    Respiratory: No cough, dyspnea  Urologic: No hematuria, dysuria  Abdominal: No nausea, vomiting, diarrhea, bright red blood per rectum, melena, or hematemesis  Neurologic: No visual changes, wkns, changes in mental status.  All other systems reviewed and are otherwise negative except as noted above.  Physical Exam  Blood pressure 156/83, height 5\' 5"  (1.651 m), weight 142 lb (64.411 kg).  General: Pleasant, NAD  Psych: Normal affect.  Neuro: Alert and oriented X 3. Moves all extremities spontaneously.  HEENT: Normal  Neck: Supple without bruits or JVD.  Lungs: Resp regular and unlabored, CTA.  Heart: RRR no s3, s4, or murmurs.  Abdomen: Soft, non-tender, non-distended, BS + x 4.  Extremities: No clubbing, cyanosis or edema. DP/PT/Radials 2+ and equal bilaterally.  Labs:  No results found for this basename: CKTOTAL, CKMB, TROPONINI, in the last 72 hours  No results found for this basename: WBC, HGB, HCT, MCV,  PLT   No results found for this basename: NA, K, CL, CO2, BUN, CREATININE, CALCIUM, LABALBU, PROT, BILITOT, ALKPHOS, ALT, AST, GLUCOSE, in the last 168 hours  No results found for this basename: CHOL, HDL, LDLCALC, TRIG    No results found for this basename: DDIMER   No components found with this basename: POCBNP,   Accessory Clinical Findings   Echocardiogram - 07/31/2013  - Left ventricle: The cavity size was normal. Wall thickness was normal. Systolic function was moderately to severely reduced. The estimated ejection fraction was in the range of 30% to 35%. Diffuse hypokinesis. Doppler parameters are consistent with abnormal left ventricular relaxation (grade 1 diastolic dysfunction). - Aortic valve: There was no stenosis. Trivial regurgitation. - Mitral valve: Mildly calcified annulus. Mildly calcified leaflets . Trivial regurgitation. - Left atrium: The atrium was mildly dilated. - Right ventricle: The cavity size was normal. Systolic function was normal. -  Pulmonary arteries: No complete TR doppler jet so unable to estimate PA systolic pressure. - Inferior vena cava: The vessel was normal in size; the respirophasic diameter changes were in the normal range (= 50%); findings are consistent with normal central venous pressure. Impressions:  - Normal LV size with global hypokinesis, EF 30-35%. Normal RV size and systolic function. No significant valvular abnormalities.   ECG - normal sinus rhythm, 65 beats per minute normal EKG.   Exercise nuclear stress test 08/23/2013  Impression  Exercise Capacity: Good exercise capacity.  BP Response: Normal blood pressure response.  Clinical Symptoms: There is dyspnea.  ECG Impression: No significant ST segment change suggestive of ischemia.  Comparison with Prior Nuclear Study: No images to compare  Overall Impression: Low risk stress nuclear study with no significant areas of ischemia identified..  LV Ejection Fraction: Study not gated.  Donato Schultz, MD  Echocardiogram : 02/06/2014   - Procedure narrative: Limited study for LV function. - Left ventricle: The cavity size was mildly dilated. Wall thickness was normal. Systolic function was normal. The estimated ejection fraction was in the range of 50% to 55%. - Ventricular septum: Incoordinate distal septal motion.  Impressions:  - Compared to the prior echo in 11/2013, the EF has improved from 35-40% up to 50-55%.  TTE: 11/22/14 - Left ventricle: The cavity size was normal. Systolic function was normal. The estimated ejection fraction was in the range of 50% to 55%. There is severe hypokinesis of the entireinferior myocardium. There was an increased relative contribution of atrial contraction to ventricular filling. Doppler parameters are consistent with abnormal left ventricular relaxation (grade 1 diastolic dysfunction). - Aortic valve: Trileaflet; normal thickness, mildly calcified leaflets. There was trivial  regurgitation. - Left atrium: The atrium was mildly dilated.    Assessment & Plan   Very pleasant 61 year old female   1. Systolic CHF, NYHA class I, non-ischemic cardiomyopathy, ejection fraction 30-35%, improved to 50-55%, now more fatigue, we will schedule an echocardiogram since she hasn't had one in 2 years. Labs in 12/2015 all normal including TSH. Cough with ACEI, fatigue with losartan and valsartan,  Benicar - olmesartan 20 mg po daily works great for her, we will continue.   2. Palpitations -  48 hour Holter monitor showed only frequent PVCs and PACs. Now resolved.   3. Hypertension - controlled on Benicar 20 mg po daily  4. Lipids - at goal in 12/2015 and no evidence of coronary artery disease  Followup in 6 months, CMP, CBC, TSH and lipids scheduled prior to the visit, echocardiogram now.  Oden Lindaman,  Faustino Congress, MD, [facility]  06/18/2016

## 2016-06-18 NOTE — Patient Instructions (Signed)
Medication Instructions:   Your physician recommends that you continue on your current medications as directed. Please refer to the Current Medication list given to you today.    Labwork:  PRIOR TOO YOUR 6 MONTH FOLLOW-UP APPOINTMENT WITH DR [name] TO CHECK---CMET, CBC W DIFF, TSH, AND LIPIDS---PLEASE COME FASTING TO THIS LAB APPOINTMENT     Testing/Procedures:  Your physician has requested that you have an echocardiogram. Echocardiography is a painless test that uses sound waves to create images of your heart. It provides your doctor with information about the size and shape of your heart and how well your heart's chambers and valves are working. This procedure takes approximately one hour. There are no restrictions for this procedure.     Follow-Up:  Your physician wants you to follow-up in: 6 MONTHS WITH DR Johnell Comings will receive a reminder letter in the mail two months in advance. If you don't receive a letter, please call our office to schedule the follow-up appointment.        If you need a refill on your cardiac medications before your next appointment, please call your pharmacy.

## 2016-07-02 ENCOUNTER — Ambulatory Visit (HOSPITAL_COMMUNITY): Payer: Managed Care, Other (non HMO) | Attending: Cardiology

## 2016-07-02 ENCOUNTER — Other Ambulatory Visit: Payer: Self-pay

## 2016-07-02 DIAGNOSIS — I493 Ventricular premature depolarization: Secondary | ICD-10-CM | POA: Insufficient documentation

## 2016-07-02 DIAGNOSIS — I10 Essential (primary) hypertension: Secondary | ICD-10-CM | POA: Diagnosis not present

## 2016-07-02 DIAGNOSIS — I371 Nonrheumatic pulmonary valve insufficiency: Secondary | ICD-10-CM | POA: Diagnosis not present

## 2016-07-02 DIAGNOSIS — I5022 Chronic systolic (congestive) heart failure: Secondary | ICD-10-CM | POA: Diagnosis present

## 2016-07-02 DIAGNOSIS — I110 Hypertensive heart disease with heart failure: Secondary | ICD-10-CM | POA: Insufficient documentation

## 2016-07-02 DIAGNOSIS — I351 Nonrheumatic aortic (valve) insufficiency: Secondary | ICD-10-CM | POA: Diagnosis not present

## 2016-07-02 DIAGNOSIS — I429 Cardiomyopathy, unspecified: Secondary | ICD-10-CM | POA: Insufficient documentation

## 2016-07-02 DIAGNOSIS — E785 Hyperlipidemia, unspecified: Secondary | ICD-10-CM | POA: Insufficient documentation

## 2016-07-06 ENCOUNTER — Telehealth: Payer: Self-pay | Admitting: Cardiology

## 2016-07-06 MED ORDER — ALDACTONE 25 MG PO TABS: 12.5000 mg | ORAL_TABLET | Freq: Every day | ORAL | 1 refills | Status: DC

## 2016-07-06 NOTE — Telephone Encounter (Signed)
-----   Message from Lars Masson, MD sent at 07/04/2016  5:06 PM EDT ----- Her LVEF remains mildly decreased approximately 50%, unchanged from prior, I would add spironolactone 12.5 mg po daily for improved CHF management.

## 2016-07-06 NOTE — Telephone Encounter (Signed)
Follow [id number] your call from yesterday,concerning her Echo result.Please call her after 1:30,she will be available to talk.

## 2016-07-06 NOTE — Telephone Encounter (Signed)
Notified the pt that per Dr Delton See, her echo showed that her LVEF remains mildly decreased approximately 50%, unchanged from prior, she would add spironolactone 12.5 mg po daily for improved CHF management.   Per the pt, she would like this filled as Brand name and for just a 30 day supply for right now, to make sure she tolerates this appropriately.  Confirmed the pharmacy of choice with the pt.   Pt verbalized understanding and agrees with this plan.

## 2016-07-06 NOTE — Telephone Encounter (Signed)
Follow up    Pt 2nd call regarding her echo results

## 2016-07-18 ENCOUNTER — Other Ambulatory Visit: Payer: Self-pay | Admitting: Cardiology

## 2016-12-28 ENCOUNTER — Other Ambulatory Visit: Payer: Managed Care, Other (non HMO)

## 2016-12-28 DIAGNOSIS — I10 Essential (primary) hypertension: Secondary | ICD-10-CM

## 2016-12-28 DIAGNOSIS — I5022 Chronic systolic (congestive) heart failure: Secondary | ICD-10-CM

## 2016-12-28 LAB — LIPID PANEL
Chol/HDL Ratio: 2.8 ratio (ref 0.0–4.4)
Cholesterol, Total: 182 mg/dL (ref 100–199)
HDL: 64 mg/dL (ref >39)
LDL Calculated: 102 mg/dL — ABNORMAL HIGH (ref 0–99)
Triglycerides: 78 mg/dL (ref 0–149)
VLDL Cholesterol Cal: 16 mg/dL (ref 5–40)

## 2016-12-28 LAB — CBC WITH DIFFERENTIAL/PLATELET
Basophils Absolute: 0 10*3/uL (ref 0.0–0.2)
Basos: 0 %
EOS (ABSOLUTE): 0.1 10*3/uL (ref 0.0–0.4)
Eos: 2 %
Hematocrit: 35.1 % (ref 34.0–46.6)
Hemoglobin: 12.2 g/dL (ref 11.1–15.9)
Immature Grans (Abs): 0 10*3/uL (ref 0.0–0.1)
Immature Granulocytes: 0 %
Lymphocytes Absolute: 1.8 10*3/uL (ref 0.7–3.1)
Lymphs: 32 %
MCH: 31.9 pg (ref 26.6–33.0)
MCHC: 34.8 g/dL (ref 31.5–35.7)
MCV: 92 fL (ref 79–97)
Monocytes Absolute: 0.3 10*3/uL (ref 0.1–0.9)
Monocytes: 6 %
Neutrophils Absolute: 3.3 10*3/uL (ref 1.4–7.0)
Neutrophils: 60 %
Platelets: 267 10*3/uL (ref 150–379)
RBC: 3.83 x10E6/uL (ref 3.77–5.28)
RDW: 12 % — ABNORMAL LOW (ref 12.3–15.4)
WBC: 5.6 10*3/uL (ref 3.4–10.8)

## 2016-12-28 LAB — COMPREHENSIVE METABOLIC PANEL
ALT: 9 IU/L (ref 0–32)
AST: 13 IU/L (ref 0–40)
Albumin/Globulin Ratio: 1.5 (ref 1.2–2.2)
Albumin: 4 g/dL (ref 3.6–4.8)
Alkaline Phosphatase: 55 IU/L (ref 39–117)
BUN/Creatinine Ratio: 23 (ref 12–28)
BUN: 21 mg/dL (ref 8–27)
Bilirubin Total: 0.5 mg/dL (ref 0.0–1.2)
CO2: 25 mmol/L (ref 20–29)
Calcium: 9 mg/dL (ref 8.7–10.3)
Chloride: 106 mmol/L (ref 96–106)
Creatinine, Ser: 0.93 mg/dL (ref 0.57–1.00)
GFR calc Af Amer: 76 mL/min/{1.73_m2} (ref >59)
GFR calc non Af Amer: 66 mL/min/{1.73_m2} (ref >59)
Globulin, Total: 2.7 g/dL (ref 1.5–4.5)
Glucose: 92 mg/dL (ref 65–99)
Potassium: 5.1 mmol/L (ref 3.5–5.2)
Sodium: 144 mmol/L (ref 134–144)
Total Protein: 6.7 g/dL (ref 6.0–8.5)

## 2016-12-28 LAB — TSH: TSH: 2.95 u[IU]/mL (ref 0.450–4.500)

## 2017-01-10 ENCOUNTER — Encounter (INDEPENDENT_AMBULATORY_CARE_PROVIDER_SITE_OTHER): Payer: Self-pay

## 2017-01-10 ENCOUNTER — Ambulatory Visit (INDEPENDENT_AMBULATORY_CARE_PROVIDER_SITE_OTHER): Payer: Managed Care, Other (non HMO) | Admitting: Cardiology

## 2017-01-10 ENCOUNTER — Encounter: Payer: Self-pay | Admitting: Cardiology

## 2017-01-10 VITALS — BP 124/66 | HR 62 | Ht 64.0 in | Wt 151.0 lb

## 2017-01-10 DIAGNOSIS — E782 Mixed hyperlipidemia: Secondary | ICD-10-CM | POA: Diagnosis not present

## 2017-01-10 DIAGNOSIS — I10 Essential (primary) hypertension: Secondary | ICD-10-CM

## 2017-01-10 DIAGNOSIS — I5023 Acute on chronic systolic (congestive) heart failure: Secondary | ICD-10-CM | POA: Insufficient documentation

## 2017-01-10 DIAGNOSIS — N952 Postmenopausal atrophic vaginitis: Secondary | ICD-10-CM | POA: Insufficient documentation

## 2017-01-10 DIAGNOSIS — I420 Dilated cardiomyopathy: Secondary | ICD-10-CM | POA: Diagnosis not present

## 2017-01-10 DIAGNOSIS — E559 Vitamin D deficiency, unspecified: Secondary | ICD-10-CM | POA: Insufficient documentation

## 2017-01-10 DIAGNOSIS — M810 Age-related osteoporosis without current pathological fracture: Secondary | ICD-10-CM | POA: Insufficient documentation

## 2017-01-10 NOTE — Progress Notes (Signed)
Patient ID: [name], female   DOB: 15-Sep-1955, 61 y.o.   MRN: [id number]    Primary Care Provider: No primary provider on file.  Primary Cardiologist: Lars Masson   Chief complain: 6 months follow up Primary cardiologist: Dr Delton See  HPI   61 year old female with h/o gastric reflux and palpitations and significant family history of coronary artery disease was coming with concerns of shortness of breath and palpitations. The patient states that her palpitations started years ago and she underwent Holter monitoring that showed frequent arrhythmia that she cannot specify further but at the time it was recommended that no treatment is necessary. She states that over time her palpitations got worse and are happening almost daily especially when laying in bed and on her left side makes her feel short of breath with improvement when laying on her back. The patient states that one she stopped eating chocolate and fiber bars her symptoms have improved but are still persistent. She states that they last seconds to minutes and are not associated with dizziness.  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 She walks on a regular bases with her husband up to 30 minutes a day however she states at moderate exertion she gets short of breath. She states that that's the way it has been for her and she was never able to run or do any moderate exercise activity.  She has significant family history of coronary artery disease her mother died of massive myocardial infarction at age of 62, there are multiple other family members with CAD. Patient states that her lipids are followed by her primary care physician and are at goal. Her husband states that she has apneic episodes at night if she snores significantly. She also had a finding of a thyroid nodule 5 years ago that was advised to be  followed.   The patient underwent echocardiography that showed left ventricular ejection fraction 30-35% with preserved left ventricular size and diffuse hypokinesis. An exercise nuclear stress test showed no ischemia and patient was started on lisinopril 5 mg daily. The patient is tolerating the medication very well she denies any cough. In fact she feels that her energy level has increased and her shortness of breath has improved minimally. She is working a 12 hour shift and feels exhausted by the end of the day feeling profoundly short of breath.  10/11/2013 - the patient has some improvement of her fatigue mostly because she is out of work and enjoying life. She denies orthopnea, lower extremity edema, paroxysmal nocturnal dyspnea, shortness of breath or chest pain. She is compliant to her medications. She describes symptoms consistent with benign positional vertigo.  12/31/2013 - The patient feels better and stronger, able to work with limited hours restriction. No LE edema, orthopnea, PND. Some fatigue and vivid dreams with lisinopril 10 mg po daily. No CP or DOE, walks daily.  03/01/2014 - the patient continues going to cardiac rehabilitation and feels significantly stronger list iron. Her shortness of breath has significantly improved on the top of days her echocardiogram has finally showed improvement in her left ventricular ejection fraction currently 50-55%. Previously 30-35%. No lower extremity edema no orthopnea or proximal nocturnal dyspnea. The patient complains of mildly productive cough that has been going on for the last months, however it is associated with postnasal drip.  08/30/14 - the  patient is coming after 6 months, in October 2015 her LVEF improved to normal values 50-55% and symptomatically she stated that she felt like in her 66' again. She developed cough with ACEI and lisinopril was discontinued. We tried losartan that gave her significant fatigue, then switched to valsartan  that is better but she still fells tired, especially after work. She completed cardiac rehab and hasn't been exercising since. She denies palpitations, syncope, SOB, CP, orthopnea or PND. No LE edema.   06/18/2016 - 6 months follow up, she is overall doing well, but feeling more tired, she thinks they maybe it is because of winter. She denies LE edema, no orthopnea, PND. No chest pain, SOB. Minimal seconds lasting palpitations. No falls. Compliant with Benicar. No side effects. She is very excited about retiring in April 2018.   01/10/2017 - this is 6 months follow-up, the last visit patient complaining of fatigue and a repeat echocardiogram that showed mild decrease in LVEF from 50-55% to 45-50%. A low-dose spironolactone 12.5 mg daily was added to her regimen however she was able to retire was scared to started at the time. She feels slightly better she has finally retired and is congratulated to the she remains super active working 10 hours in her yard. She walks her 12 dogs and attends dance classes. She feels tired after long day but otherwise feels well denies any lower extremity edema orthopnea paroxysmal dyspnea. No palpitations or syncope no dyspnea when she works out on treadmill.  Home Medications  Prior to Admission medications   Medication  Sig  Start Date  End Date  Taking?  Authorizing Provider   ibandronate (BONIVA) 150 MG tablet   06/07/13   Yes  Historical Provider, MD   omeprazole (PRILOSEC) 20 MG capsule  Take 20 mg by mouth daily.    Yes  Historical Provider, MD   Family History  No family history on file.  Social History  History    Social History   .  Marital Status:  Married     Spouse Name:  N/A     Number of Children:  N/A   .  Years of Education:  N/A    Occupational History   .  Not on file.    Social History Main Topics   .  Smoking status:  Never Smoker   .  Smokeless tobacco:  Not on file   .  Alcohol Use:  No   .  Drug Use:  No   .  Sexual Activity:  Not on  file    Other Topics  Concern   .  Not on file    Social History Narrative   .  No narrative on file    Review of Systems, as per HPI, otherwise negative  General: No chills, fever, night sweats or weight changes.  Cardiovascular: No chest pain, dyspnea on exertion, edema, orthopnea, palpitations, paroxysmal nocturnal dyspnea.  Dermatological: No rash, lesions/masses  Respiratory: No cough, dyspnea  Urologic: No hematuria, dysuria  Abdominal: No nausea, vomiting, diarrhea, bright red blood per rectum, melena, or hematemesis  Neurologic: No visual changes, wkns, changes in mental status.  All other systems reviewed and are otherwise negative except as noted above.  Physical Exam  Blood pressure 156/83, height  (1.651 m), weight 142 lb (64.411 kg).  General: Pleasant, NAD  Psych: Normal affect.  Neuro: Alert and oriented X 3. Moves all extremities spontaneously.  HEENT: Normal  Neck: Supple without bruits or JVD.  Lungs: Resp regular and unlabored, CTA.  Heart: RRR no s3, s4, or murmurs.  Abdomen: Soft, non-tender, non-distended, BS + x 4.  Extremities: No clubbing, cyanosis or edema. DP/PT/Radials 2+ and equal bilaterally.  Labs:  No results found for this basename: CKTOTAL, CKMB, TROPONINI, in the last 72 hours  No results found for this basename: WBC, HGB, HCT, MCV, PLT   No results found for this basename: NA, K, CL, CO2, BUN, CREATININE, CALCIUM, LABALBU, PROT, BILITOT, ALKPHOS, ALT, AST, GLUCOSE, in the last 168 hours  No results found for this basename: CHOL, HDL, LDLCALC, TRIG    No results found for this basename: DDIMER   No components found with this basename: POCBNP,   Accessory Clinical Findings   Echocardiogram - 07/31/2013 - Left ventricle: The cavity size was normal. Wall thickness was normal. Systolic function was moderately to severely reduced. The estimated ejection fraction was in the range of 30% to 35%. Diffuse hypokinesis. Doppler parameters  are consistent with abnormal left ventricular relaxation (grade 1 diastolic dysfunction). - Aortic valve: There was no stenosis. Trivial regurgitation. - Mitral valve: Mildly calcified annulus. Mildly calcified leaflets . Trivial regurgitation. - Left atrium: The atrium was mildly dilated. - Right ventricle: The cavity size was normal. Systolic function was normal. - Pulmonary arteries: No complete TR doppler jet so unable to estimate PA systolic pressure. - Inferior vena cava: The vessel was normal in size; the respirophasic diameter changes were in the normal range (= 50%); findings are consistent with normal central venous pressure. Impressions:  - Normal LV size with global hypokinesis, EF 30-35%. Normal RV size and systolic function. No significant valvular abnormalities.  Exercise nuclear stress test 08/23/2013  Impression  Exercise Capacity: Good exercise capacity.  BP Response: Normal blood pressure response.  Clinical Symptoms: There is dyspnea.  ECG Impression: No significant ST segment change suggestive of ischemia.  Comparison with Prior Nuclear Study: No images to compare  Overall Impression: Low risk stress nuclear study with no significant areas of ischemia identified..  LV Ejection Fraction: Study not gated.  Donato Schultz, MD  Echocardiogram : 02/06/2014   - Procedure narrative: Limited study for LV function. - Left ventricle: The cavity size was mildly dilated. Wall thickness was normal. Systolic function was normal. The estimated ejection fraction was in the range of 50% to 55%. - Ventricular septum: Incoordinate distal septal motion.  Impressions: - Compared to the prior echo in 11/2013, the EF has improved from 35-40% up to 50-55%.  TTE: 11/22/14 - Left ventricle: The cavity size was normal. Systolic function was normal. The estimated ejection fraction was in the range of 50% to 55%. There is severe hypokinesis of the  entireinferior myocardium. There was an increased relative contribution of atrial contraction to ventricular filling. Doppler parameters are consistent with abnormal left ventricular relaxation (grade 1 diastolic dysfunction). - Aortic valve: Trileaflet; normal thickness, mildly calcified leaflets. There was trivial regurgitation. - Left atrium: The atrium was mildly dilated.  TTE: 07/02/2016 - Left ventricle: The cavity size was mildly dilated. Systolic   function was mildly reduced. The estimated ejection fraction was   in the range of 45% to 50%. Diffuse hypokinesis. There is   hypokinesis of the entireinferior myocardium. There was an   increased relative contribution of atrial contraction to   ventricular filling. Doppler parameters are consistent with   abnormal left ventricular relaxation (grade 1 diastolic   dysfunction). - Aortic valve: Trileaflet; normal thickness, mildly calcified   leaflets. There was mild  regurgitation. Regurgitation pressure   half-time: 527 ms. - Mitral valve: There was trivial regurgitation. - Atrial septum: There was increased thickness of the septum,   consistent with lipomatous hypertrophy.  EKG performed today 01/10/2017 was personally reviewed and shows normal sinus rhythm with nonspecific T-wave abnormalities unchanged from prior.  Assessment & Plan   1. Acute on chronic combined systolic and diastolic CHF  - Most recent LVEF 45-50%, Aldactone was added to her regimen she hasn't started but is encouraged to start it now. - She is well compensated functional class IIa. -She tolerates on losartan well.  2. Palpitations -  48 hour Holter monitor showed only frequent PVCs and PACs. Now resolved.   3. Hypertension - controlled on Benicar 20 mg po daily, we're adding artifact on 1225 mg daily.  4. Lipids - at goal in 12/2016 and no evidence of coronary artery disease  I have reviewed her labs performed on 12/28/2016 they're all at goal  including lipids normal kidney and liver function.  Follow-up in 6 months.  Lars Masson, MD, [facility] [place]  01/10/2017

## 2017-01-10 NOTE — Patient Instructions (Signed)

## 2017-01-12 NOTE — Addendum Note (Signed)
Addended by: Micki Riley C on: 01/12/2017 08:34 AM   Modules accepted: Orders

## 2017-06-09 ENCOUNTER — Encounter (INDEPENDENT_AMBULATORY_CARE_PROVIDER_SITE_OTHER): Payer: Self-pay

## 2017-06-09 ENCOUNTER — Ambulatory Visit (INDEPENDENT_AMBULATORY_CARE_PROVIDER_SITE_OTHER): Payer: Managed Care, Other (non HMO) | Admitting: Cardiology

## 2017-06-09 ENCOUNTER — Encounter: Payer: Self-pay | Admitting: Cardiology

## 2017-06-09 VITALS — BP 130/78 | HR 62 | Ht 64.0 in | Wt 152.0 lb

## 2017-06-09 DIAGNOSIS — I10 Essential (primary) hypertension: Secondary | ICD-10-CM

## 2017-06-09 DIAGNOSIS — I5023 Acute on chronic systolic (congestive) heart failure: Secondary | ICD-10-CM | POA: Diagnosis not present

## 2017-06-09 DIAGNOSIS — E782 Mixed hyperlipidemia: Secondary | ICD-10-CM | POA: Diagnosis not present

## 2017-06-09 NOTE — Patient Instructions (Signed)
Medication Instructions:   Your physician recommends that you continue on your current medications as directed. Please refer to the Current Medication list given to you today.    Labwork:  PRIOR TO YOUR 6 MONTH FOLLOW-UP APPOINTMENT WITH DR [name]--WE WILL A CMET, CBC W DIFF, TSH, AND LIPIDS--PLEASE COME FASTING TO THIS LAB APPOINTMENT     Follow-Up:  Your physician wants you to follow-up in: 6 MONTHS WITH DR Johnell Comings will receive a reminder letter in the mail two months in advance. If you don't receive a letter, please call our office to schedule the follow-up appointment.  PLEASE HAVE YOUR LABS DONE A COUPLE DAYS PRIOR TO THIS APPOINTMENT         If you need a refill on your cardiac medications before your next appointment, please call your pharmacy.

## 2017-06-09 NOTE — Progress Notes (Signed)
Patient ID: [name], female   DOB: 1955-09-28, 62 y.o.   MRN: [id number]    Primary Care Provider: No primary provider on file.  Primary Cardiologist: Lars Masson   Chief complain: 6 months follow up Primary cardiologist: Dr Delton See  HPI   62 year old female with h/o gastric reflux and palpitations and significant family history of coronary artery disease was coming with concerns of shortness of breath and palpitations. The patient states that her palpitations started years ago and she underwent Holter monitoring that showed frequent arrhythmia that she cannot specify further but at the time it was recommended that no treatment is necessary. She states that over time her palpitations got worse and are happening almost daily especially when laying in bed and on her left side makes her feel short of breath with improvement when laying on her back. The patient states that one she stopped eating chocolate and fiber bars her symptoms have improved but are still persistent. She states that they last seconds to minutes and are not associated with dizziness.  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 She walks on a regular bases with her husband up to 30 minutes a day however she states at moderate exertion she gets short of breath. She states that that's the way it has been for her and she was never able to run or do any moderate exercise activity.  She has significant family history of coronary artery disease her mother died of massive myocardial infarction at age of 35, there are multiple other family members with CAD. Patient states that her lipids are followed by her primary care physician and are at goal. Her husband states that she has apneic episodes at night if she snores significantly. She also had a finding of a thyroid nodule 5 years ago that was advised to be  followed.   The patient underwent echocardiography that showed left ventricular ejection fraction 30-35% with preserved left ventricular size and diffuse hypokinesis. An exercise nuclear stress test showed no ischemia and patient was started on lisinopril 5 mg daily. The patient is tolerating the medication very well she denies any cough. In fact she feels that her energy level has increased and her shortness of breath has improved minimally. She is working a 12 hour shift and feels exhausted by the end of the day feeling profoundly short of breath.  10/11/2013 - the patient has some improvement of her fatigue mostly because she is out of work and enjoying life. She denies orthopnea, lower extremity edema, paroxysmal nocturnal dyspnea, shortness of breath or chest pain. She is compliant to her medications. She describes symptoms consistent with benign positional vertigo.  12/31/2013 - The patient feels better and stronger, able to work with limited hours restriction. No LE edema, orthopnea, PND. Some fatigue and vivid dreams with lisinopril 10 mg po daily. No CP or DOE, walks daily.  03/01/2014 - the patient continues going to cardiac rehabilitation and feels significantly stronger list iron. Her shortness of breath has significantly improved on the top of days her echocardiogram has finally showed improvement in her left ventricular ejection fraction currently 50-55%. Previously 30-35%. No lower extremity edema no orthopnea or proximal nocturnal dyspnea. The patient complains of mildly productive cough that has been going on for the last months, however it is associated with postnasal drip.  08/30/14 - the  patient is coming after 6 months, in October 2015 her LVEF improved to normal values 50-55% and symptomatically she stated that she felt like in her 46' again. She developed cough with ACEI and lisinopril was discontinued. We tried losartan that gave her significant fatigue, then switched to valsartan  that is better but she still fells tired, especially after work. She completed cardiac rehab and hasn't been exercising since. She denies palpitations, syncope, SOB, CP, orthopnea or PND. No LE edema.   06/18/2016 - 6 months follow up, she is overall doing well, but feeling more tired, she thinks they maybe it is because of winter. She denies LE edema, no orthopnea, PND. No chest pain, SOB. Minimal seconds lasting palpitations. No falls. Compliant with Benicar. No side effects. She is very excited about retiring in April 2018.   01/10/2017 - this is 6 months follow-up, the last visit patient complaining of fatigue and a repeat echocardiogram that showed mild decrease in LVEF from 50-55% to 45-50%. A low-dose spironolactone 12.5 mg daily was added to her regimen however she was able to retire was scared to started at the time. She feels slightly better she has finally retired and is congratulated to the she remains super active working 10 hours in her yard. She walks her 12 dogs and attends dance classes. She feels tired after long day but otherwise feels well denies any lower extremity edema orthopnea paroxysmal dyspnea. No palpitations or syncope no dyspnea when she works out on treadmill.  06/09/2017 - the patient is coming after 6 months, he she is feeling great, she walks about one half daily, denies any chest pain shortness of breath, no lower extremity edema claudications. She has no orthopnea or paroxysmal nocturnal dyspnea. No palpitations dizziness or syncope. She has been compliant with all her medications except for Aldactone.  Home Medications  Prior to Admission medications   Medication  Sig  Start Date  End Date  Taking?  Authorizing Provider   ibandronate (BONIVA) 150 MG tablet   06/07/13   Yes  Historical Provider, MD   omeprazole (PRILOSEC) 20 MG capsule  Take 20 mg by mouth daily.    Yes  Historical Provider, MD   Family History  No family history on file.  Social History  History     Social History   .  Marital Status:  Married     Spouse Name:  N/A     Number of Children:  N/A   .  Years of Education:  N/A    Occupational History   .  Not on file.    Social History Main Topics   .  Smoking status:  Never Smoker   .  Smokeless tobacco:  Not on file   .  Alcohol Use:  No   .  Drug Use:  No   .  Sexual Activity:  Not on file    Other Topics  Concern   .  Not on file    Social History Narrative   .  No narrative on file    Review of Systems, as per HPI, otherwise negative  General: No chills, fever, night sweats or weight changes.  Cardiovascular: No chest pain, dyspnea on exertion, edema, orthopnea, palpitations, paroxysmal nocturnal dyspnea.  Dermatological: No rash, lesions/masses  Respiratory: No cough, dyspnea  Urologic: No hematuria, dysuria  Abdominal: No nausea, vomiting, diarrhea, bright red blood per rectum, melena, or hematemesis  Neurologic: No visual changes, wkns, changes in mental status.  All other  systems reviewed and are otherwise negative except as noted above.  Physical Exam  Blood pressure 156/83, height 5\' 5"  (1.651 m), weight 142 lb (64.411 kg).  General: Pleasant, NAD  Psych: Normal affect.  Neuro: Alert and oriented X 3. Moves all extremities spontaneously.  HEENT: Normal  Neck: Supple without bruits or JVD.  Lungs: Resp regular and unlabored, CTA.  Heart: RRR no s3, s4, or murmurs.  Abdomen: Soft, non-tender, non-distended, BS + x 4.  Extremities: No clubbing, cyanosis or edema. DP/PT/Radials 2+ and equal bilaterally.  Labs:  No results found for this basename: CKTOTAL, CKMB, TROPONINI, in the last 72 hours  No results found for this basename: WBC, HGB, HCT, MCV, PLT   No results found for this basename: NA, K, CL, CO2, BUN, CREATININE, CALCIUM, LABALBU, PROT, BILITOT, ALKPHOS, ALT, AST, GLUCOSE, in the last 168 hours  No results found for this basename: CHOL, HDL, LDLCALC, TRIG    No results found for this basename:  DDIMER   No components found with this basename: POCBNP,   Accessory Clinical Findings   Echocardiogram - 07/31/2013 - Left ventricle: The cavity size was normal. Wall thickness was normal. Systolic function was moderately to severely reduced. The estimated ejection fraction was in the range of 30% to 35%. Diffuse hypokinesis. Doppler parameters are consistent with abnormal left ventricular relaxation (grade 1 diastolic dysfunction). - Aortic valve: There was no stenosis. Trivial regurgitation. - Mitral valve: Mildly calcified annulus. Mildly calcified leaflets . Trivial regurgitation. - Left atrium: The atrium was mildly dilated. - Right ventricle: The cavity size was normal. Systolic function was normal. - Pulmonary arteries: No complete TR doppler jet so unable to estimate PA systolic pressure. - Inferior vena cava: The vessel was normal in size; the respirophasic diameter changes were in the normal range (= 50%); findings are consistent with normal central venous pressure. Impressions:  - Normal LV size with global hypokinesis, EF 30-35%. Normal RV size and systolic function. No significant valvular abnormalities.  Exercise nuclear stress test 08/23/2013  Impression  Exercise Capacity: Good exercise capacity.  BP Response: Normal blood pressure response.  Clinical Symptoms: There is dyspnea.  ECG Impression: No significant ST segment change suggestive of ischemia.  Comparison with Prior Nuclear Study: No images to compare  Overall Impression: Low risk stress nuclear study with no significant areas of ischemia identified..  LV Ejection Fraction: Study not gated.  Donato Schultz, MD  Echocardiogram : 02/06/2014   - Procedure narrative: Limited study for LV function. - Left ventricle: The cavity size was mildly dilated. Wall thickness was normal. Systolic function was normal. The estimated ejection fraction was in the range of 50% to 55%. - Ventricular septum:  Incoordinate distal septal motion.  Impressions: - Compared to the prior echo in 11/2013, the EF has improved from 35-40% up to 50-55%.  TTE: 11/22/14 - Left ventricle: The cavity size was normal. Systolic function was normal. The estimated ejection fraction was in the range of 50% to 55%. There is severe hypokinesis of the entireinferior myocardium. There was an increased relative contribution of atrial contraction to ventricular filling. Doppler parameters are consistent with abnormal left ventricular relaxation (grade 1 diastolic dysfunction). - Aortic valve: Trileaflet; normal thickness, mildly calcified leaflets. There was trivial regurgitation. - Left atrium: The atrium was mildly dilated.  TTE: 07/02/2016 - Left ventricle: The cavity size was mildly dilated. Systolic   function was mildly reduced. The estimated ejection fraction was   in the range of 45% to 50%. Diffuse  hypokinesis. There is   hypokinesis of the entireinferior myocardium. There was an   increased relative contribution of atrial contraction to   ventricular filling. Doppler parameters are consistent with   abnormal left ventricular relaxation (grade 1 diastolic   dysfunction). - Aortic valve: Trileaflet; normal thickness, mildly calcified   leaflets. There was mild regurgitation. Regurgitation pressure   half-time: 527 ms. - Mitral valve: There was trivial regurgitation. - Atrial septum: There was increased thickness of the septum,   consistent with lipomatous hypertrophy.  EKG performed today 01/10/2017 was personally reviewed and shows normal sinus rhythm with nonspecific T-wave abnormalities unchanged from prior.    Assessment & Plan   1. Acute on chronic combined systolic and diastolic CHF  - Most recent LVEF 45-50%, Aldactone was added to her regimen she hasn't started, she can explained mechanism Aldactone how she could benefit from it, she agrees that she'll give it a try this  time. - She is well compensated functional class IIa. -She tolerates losartan well. - I will repeat echocardiogram once she is on Aldactone for 3 months.  2. Palpitations -  48 hour Holter monitor showed only frequent PVCs and PACs. Now resolved.   3. Hypertension - controlled on Benicar 20 mg po daily.  4. Lipids - at goal in 12/2016 and no evidence of coronary artery disease  Follow-up in 6 months. Obtain labs at that time.  Lars Masson, MD, [facility]  06/09/2017

## 2017-06-14 ENCOUNTER — Telehealth: Payer: Self-pay

## 2017-06-14 NOTE — Telephone Encounter (Signed)
**Note De-Identified Reuben Knoblock Obfuscation** I have done a Benicar PA through covermymeds.

## 2017-06-14 NOTE — Telephone Encounter (Addendum)
**Note De-Identified Syed Zukas Obfuscation** Denial received on the pts Benicar.  I called [facility] and did an appeal over the phone with [facility]. Per Alvy Beal this PA is approved until 06/15/2018.  I have notified the pt and her pharmacy.

## 2017-08-12 ENCOUNTER — Other Ambulatory Visit: Payer: Self-pay | Admitting: Cardiology

## 2017-09-21 ENCOUNTER — Encounter

## 2017-09-21 ENCOUNTER — Ambulatory Visit: Payer: Managed Care, Other (non HMO) | Admitting: Cardiology

## 2017-10-07 ENCOUNTER — Telehealth: Payer: Self-pay | Admitting: Cardiology

## 2017-10-07 DIAGNOSIS — I420 Dilated cardiomyopathy: Secondary | ICD-10-CM

## 2017-10-07 DIAGNOSIS — I10 Essential (primary) hypertension: Secondary | ICD-10-CM

## 2017-10-07 DIAGNOSIS — I5023 Acute on chronic systolic (congestive) heart failure: Secondary | ICD-10-CM

## 2017-10-07 DIAGNOSIS — I5022 Chronic systolic (congestive) heart failure: Secondary | ICD-10-CM

## 2017-10-07 NOTE — Telephone Encounter (Signed)
New Message:       Pt is calling and has an appt in 01/26/18 but wants to know if she needs to come in sooner than this due to it supposed to be a 6 mnth f/u in august

## 2017-10-07 NOTE — Telephone Encounter (Signed)
Pt is scheduled to see Dr Delton See for 01/26/18 for 6 month follow-up.  Pt did want to tell Dr Delton See that at her last visit with her, she wanted her to start taking aldactone for decreased EF on last echo. Pt states that she gave it a try, but she noted when she took this it caused her to have palpitations.  Pt states she gradually tapered this med down and stopped taking it all together, and now she is having NO SYMPTOMS OR PALPITATIONS at all.  Pt states she feels well now.  Pt has no cardiac complaints, no lower extremity edema, and no sob or doe. Pt states she wants Dr Delton See to know this incase she needs to be on a different regimen.  Informed the pt that Dr Delton See is out of the office for the next few days, but I will route this message to her for further review and recommendation and follow-up with the pt shortly thereafter.  Pt verbalized understanding and agrees with this plan.

## 2017-10-11 NOTE — Telephone Encounter (Signed)
Pts echo is scheduled her for 7/10 at 11:30am. She voiced understanding.

## 2017-10-11 NOTE — Telephone Encounter (Signed)
Spoke with the [name] and informed her that Dr Delton See recommends that we discontinue her aldactone (will update in her allergies as an intolerance), and order for her to have a repeat echo done, for its been a year since her last one.  Informed the [name] that I will place the order for the echo in the system, and have a [facility] scheduler call her back to arrange this appt.  [name] verbalized understanding and agrees with this plan.

## 2017-10-11 NOTE — Telephone Encounter (Signed)
I would stop taking it and repeat echocardiogram since it has been over a year since the last one.

## 2017-10-19 ENCOUNTER — Other Ambulatory Visit: Payer: Self-pay

## 2017-10-19 ENCOUNTER — Ambulatory Visit (HOSPITAL_COMMUNITY): Payer: Managed Care, Other (non HMO) | Attending: Cardiology

## 2017-10-19 DIAGNOSIS — I110 Hypertensive heart disease with heart failure: Secondary | ICD-10-CM | POA: Insufficient documentation

## 2017-10-19 DIAGNOSIS — I255 Ischemic cardiomyopathy: Secondary | ICD-10-CM | POA: Diagnosis not present

## 2017-10-19 DIAGNOSIS — R002 Palpitations: Secondary | ICD-10-CM | POA: Diagnosis not present

## 2017-10-19 DIAGNOSIS — Z8249 Family history of ischemic heart disease and other diseases of the circulatory system: Secondary | ICD-10-CM | POA: Diagnosis not present

## 2017-10-19 DIAGNOSIS — I420 Dilated cardiomyopathy: Secondary | ICD-10-CM | POA: Insufficient documentation

## 2017-10-19 DIAGNOSIS — K219 Gastro-esophageal reflux disease without esophagitis: Secondary | ICD-10-CM | POA: Insufficient documentation

## 2017-10-19 DIAGNOSIS — I5023 Acute on chronic systolic (congestive) heart failure: Secondary | ICD-10-CM | POA: Diagnosis not present

## 2017-10-19 DIAGNOSIS — I083 Combined rheumatic disorders of mitral, aortic and tricuspid valves: Secondary | ICD-10-CM | POA: Insufficient documentation

## 2017-10-19 DIAGNOSIS — I5022 Chronic systolic (congestive) heart failure: Secondary | ICD-10-CM | POA: Diagnosis not present

## 2017-10-19 DIAGNOSIS — I10 Essential (primary) hypertension: Secondary | ICD-10-CM | POA: Diagnosis not present

## 2017-10-20 ENCOUNTER — Telehealth: Payer: Self-pay | Admitting: Cardiology

## 2017-10-20 DIAGNOSIS — I420 Dilated cardiomyopathy: Secondary | ICD-10-CM

## 2017-10-20 DIAGNOSIS — R9430 Abnormal result of cardiovascular function study, unspecified: Secondary | ICD-10-CM | POA: Insufficient documentation

## 2017-10-20 DIAGNOSIS — I5022 Chronic systolic (congestive) heart failure: Secondary | ICD-10-CM

## 2017-10-20 DIAGNOSIS — I5023 Acute on chronic systolic (congestive) heart failure: Secondary | ICD-10-CM

## 2017-10-20 MED ORDER — SACUBITRIL-VALSARTAN 24-26 MG PO TABS: 1.0000 | ORAL_TABLET | Freq: Two times a day (BID) | ORAL | 0 refills | Status: DC

## 2017-10-20 NOTE — Telephone Encounter (Signed)
-----   Message from Lars Masson, MD sent at 10/20/2017 12:58 PM EDT ----- Her LVEF is decreasing, now 30-35%, I would discontinue benicar, and start entresto 26/24 mg PO BID 36 hours post last dose of benicar. She will have to follow with a pharmacist for symptoms and BMP in 2 weeks from the start.

## 2017-10-20 NOTE — Telephone Encounter (Signed)
New Message:       Pt is returning a call for her echo results

## 2017-10-20 NOTE — Telephone Encounter (Addendum)
Notified the pt that per Dr Delton See, her echo showed that her LVEF is decreasing, now 30-35 %, and she recommends that we discontinue her Benicar, and start her on Entresto 24/26 mg po BID 36 hours post last dose of Benicar.  Informed the pt that per Dr Delton See, she will need to have a follow-up appt with our Pharmacist in BP clinic for symptoms and follow-up of Entresto, with a BMET in 2 weeks from the start of Entresto.  Scheduled the pts repeat BMET for 11/03/17.  Pts appt to see our Pharmacist is scheduled at their very next available for 11/08/17 at 1100.  Pt aware to arrive 15 mins prior to this appt.  Confirmed the pharmacy of choice with the pt.  Informed the pt that I will leave samples of Entresto for her to pick up tomorrow, at the front desk.  Advised the pt to start taking Entresto on 7/14, with 36 hour washout of benicar, being her last dose of that was this morning.  Pt also requested to start new med on Sunday morning 7/14. Pt understands washout education of benicar, prior to starting Entresto.  Pt verbalized understanding and agrees with this plan.

## 2017-10-21 ENCOUNTER — Telehealth: Payer: Self-pay

## 2017-10-21 NOTE — Telephone Encounter (Signed)
**Note De-Identified Tahari Clabaugh Obfuscation** Entresto PA has been approved per letter received from [facility] fax. Approval good from 09/21/17-10/20/2020  I have notified the pts pharmacy.

## 2017-10-21 NOTE — Telephone Encounter (Signed)
**Note De-Identified Tyvion Edmondson Obfuscation** I have done an [place] PA through covermymeds. Key: [id number]

## 2017-10-24 ENCOUNTER — Encounter: Payer: Self-pay | Admitting: Cardiology

## 2017-11-03 ENCOUNTER — Other Ambulatory Visit: Payer: Managed Care, Other (non HMO) | Admitting: *Deleted

## 2017-11-03 DIAGNOSIS — I5022 Chronic systolic (congestive) heart failure: Secondary | ICD-10-CM

## 2017-11-03 DIAGNOSIS — I5023 Acute on chronic systolic (congestive) heart failure: Secondary | ICD-10-CM

## 2017-11-03 DIAGNOSIS — R9430 Abnormal result of cardiovascular function study, unspecified: Secondary | ICD-10-CM

## 2017-11-03 DIAGNOSIS — I420 Dilated cardiomyopathy: Secondary | ICD-10-CM

## 2017-11-03 LAB — BASIC METABOLIC PANEL
BUN/Creatinine Ratio: 18 (ref 12–28)
BUN: 17 mg/dL (ref 8–27)
CO2: 24 mmol/L (ref 20–29)
Calcium: 9 mg/dL (ref 8.7–10.3)
Chloride: 105 mmol/L (ref 96–106)
Creatinine, Ser: 0.96 mg/dL (ref 0.57–1.00)
GFR calc Af Amer: 73 mL/min/{1.73_m2} (ref >59)
GFR calc non Af Amer: 63 mL/min/{1.73_m2} (ref >59)
Glucose: 87 mg/dL (ref 65–99)
Potassium: 4.5 mmol/L (ref 3.5–5.2)
Sodium: 143 mmol/L (ref 134–144)

## 2017-11-08 ENCOUNTER — Ambulatory Visit (INDEPENDENT_AMBULATORY_CARE_PROVIDER_SITE_OTHER): Payer: Managed Care, Other (non HMO) | Admitting: Pharmacist

## 2017-11-08 DIAGNOSIS — I5023 Acute on chronic systolic (congestive) heart failure: Secondary | ICD-10-CM | POA: Diagnosis not present

## 2017-11-08 DIAGNOSIS — I420 Dilated cardiomyopathy: Secondary | ICD-10-CM | POA: Diagnosis not present

## 2017-11-08 DIAGNOSIS — I5022 Chronic systolic (congestive) heart failure: Secondary | ICD-10-CM | POA: Diagnosis not present

## 2017-11-08 DIAGNOSIS — R9430 Abnormal result of cardiovascular function study, unspecified: Secondary | ICD-10-CM | POA: Diagnosis not present

## 2017-11-08 MED ORDER — METOPROLOL SUCCINATE ER 25 MG PO TB24: 12.5000 mg | ORAL_TABLET | Freq: Every day | ORAL | 11 refills | Status: DC

## 2017-11-08 MED ORDER — SACUBITRIL-VALSARTAN 24-26 MG PO TABS: 1.0000 | ORAL_TABLET | Freq: Two times a day (BID) | ORAL | 11 refills | Status: DC

## 2017-11-08 NOTE — Progress Notes (Signed)
Patient ID: [name]                 DOB: [birth year]                      MRN: [id number]     HPI: [name] is a 62 y.o. female referred by Dr. Delton See to pharmacy clinic for HF medication optimization. PMH is significant for family history of CAD, palpitations, and gastric reflux. Pt underwent echo in July 2019 and LVEF was 30-35%. Her olmesartan was stopped and Entresto 24-26mg  BID was started. F/u BMET was stable.  Pt presents today with her husband and grandson. She reports tolerating her Entresto well. She has occasionally noticed slight pain in the side of her rib and wonders if Sherryll Burger is contributing to this. She previously tried spironolactone a year ago but reports feeling her heart fluttering for the 2 weeks she took therapy. Reports her symptoms improved once she stopped the spironolactone. She prefers to take brand name medications as she attributes previous medication side effects to generic formulations.  Current HF meds: Entresto 24-26mg  BID Previously tried: spironolactone - heart fluttering, took for 2 weeks BP goal: <130/51mmHg  Family History: Mother with MI at age 99.  Social History: Denies tobacco, alcohol, and illicit drug use.  Diet: Breakfast - cereal or oatmeal. Lunch - salads or baked potato. Dinner - grilled chicken. Rarely eats red meat. Snacks on cottage cheese, yogurt, fruit. Does not add salt to food.  Exercise: Walks on a regular basis with her husband up to 30 minutes a day. Staying busy with house projects. Has a treadmill and elliptical at the house.  Home BP readings: Has not been checking but does have a BP cuff  Wt Readings from Last 3 Encounters:  06/09/17 152 lb (68.9 kg)  01/10/17 151 lb (68.5 kg)  06/18/16 150 lb (68 kg)   BP Readings from Last 3 Encounters:  06/09/17 130/78  01/10/17 124/66  06/18/16 122/64   Pulse Readings from Last 3 Encounters:  06/09/17 62  01/10/17 62  06/18/16 60    Renal function: CrCl cannot  be calculated (Unknown ideal weight.).  Past Medical History:  Diagnosis Date  . Esophageal reflux   . Osteoporosis   . Palpitations     Current Outpatient Medications on File Prior to Visit  Medication Sig Dispense Refill  . ibandronate (BONIVA) 150 MG tablet Take 150 mg by mouth every 30 (thirty) days.     Marland Kitchen PREMARIN vaginal cream Place vaginally as directed.   0  . RaNITidine HCl (ZANTAC PO) Take by mouth as needed.    . sacubitril-valsartan (ENTRESTO) 24-26 MG Take 1 tablet by mouth 2 (two) times daily. 60 tablet 0   No current facility-administered medications on file prior to visit.     Allergies  Allergen Reactions  . Cefdinir   . Aldactone [Spironolactone] Palpitations    Pt states causes palpitations     Assessment/Plan:  1. HF medication optimization - BP stable today, will plan to start low dose Toprol 12.5mg  daily and continue Entresto 24-26mg  BID. Pt aware to monitor HR as baseline is in the low 60s. Discussed optimizing medications to improve her LVEF. F/u in clinic in 3 weeks. May try spironolactone again (previously felt heart flutter on therapy for 2 weeks) due to mortality benefit in HFrEF patients.   Megan E. Supple, PharmD, BCACP, CPP [facility] [address], [place], [place] [address]  Phone: [phone]; Fax: [phone] 11/08/2017 1:46 PM

## 2017-11-08 NOTE — Patient Instructions (Addendum)
It was nice to meet you today  Start taking metoprolol 1/2 tablet (12.5mg ) once a day  Continue taking Entresto 24-26mg  twice daily  Monitor your blood pressure and heart rate at home. We would like your heart rate to stay above 55  Follow up in clinic in 3 weeks for blood pressure check  Call Megan, Pharmacist with any questions #[phone]

## 2017-12-01 ENCOUNTER — Ambulatory Visit (INDEPENDENT_AMBULATORY_CARE_PROVIDER_SITE_OTHER): Payer: Managed Care, Other (non HMO) | Admitting: Pharmacist

## 2017-12-01 VITALS — BP 136/72 | HR 58

## 2017-12-01 DIAGNOSIS — I5022 Chronic systolic (congestive) heart failure: Secondary | ICD-10-CM

## 2017-12-01 MED ORDER — EPLERENONE 25 MG PO TABS: 12.5000 mg | ORAL_TABLET | Freq: Every day | ORAL | 1 refills | Status: DC

## 2017-12-01 NOTE — Progress Notes (Signed)
Patient ID: [name]                 DOB: [birth year]                      MRN: [id number]     HPI: [name] a 62 y.o.femalereferred by Dr. Nelsontopharmacyclinicfor HF medication optimization.PMH is significant for family history of CAD, palpitations, and gastric reflux. Pt underwent echo in July 2019 and LVEF was 30-35%. Her olmesartan was stopped and Entresto 24-26mg  BID was started. F/u BMET was stable. BP at last clinic visit was 134/66. Her HR normally runs in the 60s. Patient reports today for follow up.   Patient presents with husband for HF management. She started taking metoprolol after the last visit but about the 3rd day after taking the metoprolol, she started feeling "bad". She endorses having no energy. She then started taking the metoprolol every other day without issues. The past 3 days she has tried taking it every day however she felt flushed this morning, felt like something was sitting on her chest, also felt nauseous. She also said she felt dizzy and the back of her head felt funny. She does endorse having history of vertigo. She does keep track of her weight but does not weigh herself daily. Now in clinic, she feels back to herself. Denies any AEs with Entresto. Denies SOB and chest pain. Patient also prefers to receive brand name medications.  Current HF meds: Entresto 24-26mg  BID, Toprol 12.5mg  every other day   Previously tried:spironolactone -heart fluttering, took for 2 weeks  BP goal:<130/47mmHg  Family History:Mother with MI at age 59.  Social History:Denies tobacco, alcohol, and illicit drug use.  Diet:Breakfast - cereal or oatmeal. Lunch - salads or baked potato. Dinner - grilled chicken. Rarely eats red meat. Snacks on cottage cheese, yogurt, fruit. Does not add salt to food.  Exercise:Walks on a regular basis with her husband up to 30 minutes a day. Staying busy with house projects. Has a treadmill and elliptical at the  house.  Home BP readings:Has not been checking but does have a BP cuff BP cuff broke, but will buy a new one.     Wt Readings from Last 3 Encounters:  06/09/17 152 lb (68.9 kg)  01/10/17 151 lb (68.5 kg)  06/18/16 150 lb (68 kg)      BP Readings from Last 3 Encounters:  11/08/17 134/66  06/09/17 130/78  01/10/17 124/66      Pulse Readings from Last 3 Encounters:  11/08/17 63  06/09/17 62  01/10/17 62    Renal function: CrCl cannot be calculated (Patient's most recent lab result is older than the maximum 21 days allowed.).      Past Medical History:  Diagnosis Date  . Esophageal reflux   . Osteoporosis   . Palpitations           Current Outpatient Medications on File Prior to Visit  Medication Sig Dispense Refill  . ibandronate (BONIVA) 150 MG tablet Take 150 mg by mouth every 30 (thirty) days.     . metoprolol succinate (TOPROL XL) 25 MG 24 hr tablet Take 0.5 tablets (12.5 mg total) by mouth daily. 15 tablet 11  . PREMARIN vaginal cream Place vaginally as directed.   0  . RaNITidine HCl (ZANTAC PO) Take by mouth as needed.    . sacubitril-valsartan (ENTRESTO) 24-26 MG Take 1 tablet by mouth 2 (two) times daily. 60 tablet 11  No current facility-administered medications on file prior to visit.          Allergies  Allergen Reactions  . Cefdinir   . Aldactone [Spironolactone] Palpitations    Pt states causes palpitations     Assessment/Plan:  1. Heart failure - Discussed HF options with patient. Patient agreed to taking the metoprolol every other day but was hesitant to start the eplerenone at the same time. Patient will start taking the metoprolol 12.5 mg every other day, we will then call her in 1 week to see how she feels. If she feels good, then will have her start eplerenone 12.5 mg daily that day. If patient not tolerating metoprolol every other day, could consider trying switch to eplerenone which patient was also aggreeable  to. When eplerenone is started, will schedule follow-up BMET in 1 week. F/u in clinic 3-4 weeks after starting eplerenone.  Thank you,  Freddie Apley. Cleatis Polka, PharmD  [facility]  12/01/2017 10:27 AM  Patient seen with Thomes Cake, PharmD Candidate

## 2017-12-01 NOTE — Patient Instructions (Addendum)
It was great seeing you today!  Continue your metoprolol 12.5 mg every other day and your Entresto 24/26 mg twice daily. We will call you in 1 week to see how you are doing on the metoprolol and will start the epleronone in 1 week. One week after starting the epleronone 12.5 mg daily, come in for follow-up labs. If you have any concerns give the clinic [phone].

## 2017-12-06 ENCOUNTER — Telehealth: Payer: Self-pay | Admitting: Pharmacist

## 2017-12-06 NOTE — Telephone Encounter (Signed)
LMOM for pt to continue current medications.

## 2017-12-06 NOTE — Telephone Encounter (Signed)
Pt called clinic to report feeling "uneasy" about taking Entresto. She reports 1 episode of vertigo since starting Entresto. She does have a history of vertigo. She has not been checking her BP at home but clinic readings have shown systolic readings in the 130s since starting Entresto. She states she stopped taking her metoprolol every other day per PCP instructions last week. She inquires about changing back to olmesartan since she tolerated this better. Discussed the benefit of staying on Entresto especially since her LVEF decreased to 30-35% when she was already taking olmesartan. Encouraged pt to purchase a BP cuff and record home readings. She would also like any additional input from Dr Delton See prior to follow up visit in October.

## 2017-12-06 NOTE — Telephone Encounter (Signed)
I agree with you Megan her LVEF has been decreasing on olmesartan alone, I would strongly encourage to continue using entresto

## 2017-12-29 ENCOUNTER — Telehealth: Payer: Self-pay | Admitting: Cardiology

## 2017-12-29 NOTE — Telephone Encounter (Signed)
Pt is calling in to let Dr Delton See know that she has been off metoprolol since 8/27 and stopped taking Entresto 2 days ago.  Pt states that she has been battling extreme vertigo and dizziness, so [employer] Pharmacist advised her to stop Metoprolol but continue taking Entresto. Pt did that and still experienced dizziness and vertigo, so she went in to see her PCP, and he advised that she make contact with our office about this.  Pt states current to date, she is not taking metoprolol, and she stopped taking Entresto 2 days ago. Pt states since stopping Entresto 2 days ago, and being off Metoprolol, her symptoms of vertigo and dizziness have completely STOPPED.  Pt states her BP is ranging in the 130s systolic and 70s diastolic, and HR staying in the 60's.  Pt states that she feels like she cannot tolerate one of the meds, and she feels like its Entresto that's causing the majority of her symptoms.  Pt states when she was on Entresto, she felt "swimmy headed," and felt that every time she moved her head, she felt nauseated.  Pt states she would like for Dr Delton See to look into this, and advise on what she should do, or advise on a different regimen.  Pt keeps insisting on going back on her previous med, Benicar.  Pt states that her PCP advised her to run this by Dr Delton See as well.  Informed the pt that I will route her concerns to Dr Delton See to review and advise on, and I will follow-up with her shortly thereafter. Pt verbalized understanding and agrees with this plan.

## 2017-12-29 NOTE — Telephone Encounter (Signed)
New Message   Pt c/o medication issue:  1. Name of Medication: sacubitril-valsartan (ENTRESTO) 24-26 MG  2. How are you currently taking this medication (dosage and times per day)? Take 1 tablet by mouth 2 (two) times daily  3. Are you having a reaction (difficulty breathing--STAT)? no  4. What is your medication issue? Patient states she has questions

## 2017-12-30 ENCOUNTER — Telehealth: Payer: Self-pay | Admitting: Cardiology

## 2017-12-30 MED ORDER — OLMESARTAN MEDOXOMIL 20 MG PO TABS: 20.0000 mg | ORAL_TABLET | Freq: Every day | ORAL | 2 refills | Status: DC

## 2017-12-30 MED ORDER — BENICAR 20 MG PO TABS: 20.0000 mg | ORAL_TABLET | Freq: Every day | ORAL | 2 refills | Status: DC

## 2017-12-30 NOTE — Telephone Encounter (Signed)
New Message:     Patient stated she can not take generic medication.   Patient is requesting a call back

## 2017-12-30 NOTE — Telephone Encounter (Signed)
She should restart metoprolol and benicar 20 mg po daily

## 2017-12-30 NOTE — Telephone Encounter (Signed)
Spoke with the pt and informed her that Dr Delton See recommends that she stop taking her Sherryll Burger, resume back taking her metoprolol, and we will start her back on Benicar 20 mg po daily.  Confirmed the pharmacy of choice with the pt.  Entresto updated in the pts chart as an allergy/intolerance with a note mentioning this med causes her dizziness and vertigo.  Advised the pt to follow-up as planned with Dr Delton See, on 10/17.  Pt verbalized understanding and agrees with this plan.

## 2017-12-30 NOTE — Telephone Encounter (Signed)
Contacted the pt and informed her that this medicine was switched to DAW for brand Benicar, and was resent to her pharmacy of choice, [facility].  Pt verbalized understanding and gracious for all the assistance.

## 2018-01-26 ENCOUNTER — Encounter: Payer: Self-pay | Admitting: Cardiology

## 2018-01-26 ENCOUNTER — Encounter

## 2018-01-26 ENCOUNTER — Ambulatory Visit (INDEPENDENT_AMBULATORY_CARE_PROVIDER_SITE_OTHER): Payer: Managed Care, Other (non HMO) | Admitting: Cardiology

## 2018-01-26 VITALS — BP 150/76 | HR 60 | Ht 64.0 in | Wt 153.0 lb

## 2018-01-26 DIAGNOSIS — I5022 Chronic systolic (congestive) heart failure: Secondary | ICD-10-CM | POA: Diagnosis not present

## 2018-01-26 DIAGNOSIS — I5023 Acute on chronic systolic (congestive) heart failure: Secondary | ICD-10-CM | POA: Diagnosis not present

## 2018-01-26 DIAGNOSIS — I420 Dilated cardiomyopathy: Secondary | ICD-10-CM

## 2018-01-26 DIAGNOSIS — R9430 Abnormal result of cardiovascular function study, unspecified: Secondary | ICD-10-CM

## 2018-01-26 DIAGNOSIS — I10 Essential (primary) hypertension: Secondary | ICD-10-CM

## 2018-01-26 MED ORDER — EPLERENONE 25 MG PO TABS: 12.5000 mg | ORAL_TABLET | Freq: Every day | ORAL | 3 refills | Status: DC

## 2018-01-26 MED ORDER — BENICAR 20 MG PO TABS: 20.0000 mg | ORAL_TABLET | Freq: Every day | ORAL | 2 refills | Status: DC

## 2018-01-26 NOTE — Patient Instructions (Signed)
Medication Instructions:   STOP TAKING METOPROLOL NOW  RESTART BACK TAKING INSPRA 12.5 MG BY MOUTH DAILY  RESTART BACK TAKING BENICAR 20 MG ONCE DAILY  If you need a refill on your cardiac medications before your next appointment, please call your pharmacy.      Testing/Procedures:  Your physician has requested that you have an echocardiogram. Echocardiography is a painless test that uses sound waves to create images of your heart. It provides your doctor with information about the size and shape of your heart and how well your heart's chambers and valves are working. This procedure takes approximately one hour. There are no restrictions for this procedure.  PLEASE SCHEDULE THIS ECHO FOR 03/2018 PER DR [name]     Follow-Up:  WITH OUR PHARMACIST MEGAN OR KELLEY IN [facility] IN 2 WEEKS

## 2018-01-26 NOTE — Progress Notes (Signed)
Patient ID: [name], female   DOB: Sep 25, 1955, 62 y.o.   MRN: [id number]    Primary Care Provider: No primary provider on file.  Primary Cardiologist: Lars Masson   Chief complain: 3 months follow up Primary cardiologist: Dr Delton See  HPI   62 year old female with h/o gastric reflux and palpitations and significant family history of coronary artery disease was coming with concerns of shortness of breath and palpitations. The patient states that her palpitations started years ago and she underwent Holter monitoring that showed frequent arrhythmia that she cannot specify further but at the time it was recommended that no treatment is necessary. She states that over time her palpitations got worse and are happening almost daily especially when laying in bed and on her left side makes her feel short of breath with improvement when laying on her back. The patient states that one she stopped eating chocolate and fiber bars her symptoms have improved but are still persistent. She states that they last seconds to minutes and are not associated with dizziness.  She has significant family history of coronary artery disease her mother died of massive myocardial infarction at age of 38, there are multiple other family members with CAD. Patient states that her lipids are followed by her primary care physician and are at goal. Her husband states that she has apneic episodes at night if she snores significantly. She also had a finding of a thyroid nodule 5 years ago that was advised to be followed.   The patient underwent echocardiography that showed left ventricular ejection fraction 30-35% with preserved left ventricular size and diffuse hypokinesis. An exercise nuclear stress test showed no ischemia.  In 2016 echocardiogram has showed improvement in her left ventricular ejection fraction currently 50-55%.   01/26/2018 -this is a 3 months follow-up, patient's LVEF has been decreasing over the last  2 years, last year 45 to 50% now 30 to 35%.  Over the last several years she stopped taking lisinopril for vivid dreams, valsartan for palpitations, Entresto for dizziness, acid reflux and palpitations, metoprolol for dizziness, she was tried on eplerenone but she has not even tried it in for the last few days she has been on 0 medicines altogether.  She states that most medications cause her significant dizziness palpitations.  Denies any falls, no lower extremity edema orthopnea proximal nocturnal dyspnea.  Home Medications  Prior to Admission medications   Medication  Sig  Start Date  End Date  Taking?  Authorizing Provider   ibandronate (BONIVA) 150 MG tablet   06/07/13   Yes  Historical Provider, MD   omeprazole (PRILOSEC) 20 MG capsule  Take 20 mg by mouth daily.    Yes  Historical Provider, MD   Family History  No family history on file.  Social History  History    Social History   .  Marital Status:  Married     Spouse Name:  N/A     Number of Children:  N/A   .  Years of Education:  N/A    Occupational History   .  Not on file.    Social History Main Topics   .  Smoking status:  Never Smoker   .  Smokeless tobacco:  Not on file   .  Alcohol Use:  No   .  Drug Use:  No   .  Sexual Activity:  Not on file    Other Topics  Concern   .  Not  on file    Social History Narrative   .  No narrative on file    Review of Systems, as per HPI, otherwise negative  General: No chills, fever, night sweats or weight changes.  Cardiovascular: No chest pain, dyspnea on exertion, edema, orthopnea, palpitations, paroxysmal nocturnal dyspnea.  Dermatological: No rash, lesions/masses  Respiratory: No cough, dyspnea  Urologic: No hematuria, dysuria  Abdominal: No nausea, vomiting, diarrhea, bright red blood per rectum, melena, or hematemesis  Neurologic: No visual changes, wkns, changes in mental status.  All other systems reviewed and are otherwise negative except as noted above.  Physical  Exam  Blood pressure 156/83, height 5\' 5"  (1.651 m), weight 142 lb (64.411 kg).  General: Pleasant, NAD  Psych: Normal affect.  Neuro: Alert and oriented X 3. Moves all extremities spontaneously.  HEENT: Normal  Neck: Supple without bruits or JVD.  Lungs: Resp regular and unlabored, CTA.  Heart: RRR no s3, s4, or murmurs.  Abdomen: Soft, non-tender, non-distended, BS + x 4.  Extremities: No clubbing, cyanosis or edema. DP/PT/Radials 2+ and equal bilaterally.  Labs:  No results found for this basename: CKTOTAL, CKMB, TROPONINI, in the last 72 hours  No results found for this basename: WBC, HGB, HCT, MCV, PLT   No results found for this basename: NA, K, CL, CO2, BUN, CREATININE, CALCIUM, LABALBU, PROT, BILITOT, ALKPHOS, ALT, AST, GLUCOSE, in the last 168 hours  No results found for this basename: CHOL, HDL, LDLCALC, TRIG    No results found for this basename: DDIMER   No components found with this basename: POCBNP,   Accessory Clinical Findings   Echocardiogram - 07/31/2013 - Left ventricle: The cavity size was normal. Wall thickness was normal. Systolic function was moderately to severely reduced. The estimated ejection fraction was in the range of 30% to 35%. Diffuse hypokinesis. Doppler parameters are consistent with abnormal left ventricular relaxation (grade 1 diastolic dysfunction). - Aortic valve: There was no stenosis. Trivial regurgitation. - Mitral valve: Mildly calcified annulus. Mildly calcified leaflets . Trivial regurgitation. - Left atrium: The atrium was mildly dilated. - Right ventricle: The cavity size was normal. Systolic function was normal. - Pulmonary arteries: No complete TR doppler jet so unable to estimate PA systolic pressure. - Inferior vena cava: The vessel was normal in size; the respirophasic diameter changes were in the normal range (= 50%); findings are consistent with normal central venous pressure. Impressions:  - Normal LV size with global  hypokinesis, EF 30-35%. Normal RV size and systolic function. No significant valvular abnormalities.  Exercise nuclear stress test 08/23/2013  Impression  Exercise Capacity: Good exercise capacity.  BP Response: Normal blood pressure response.  Clinical Symptoms: There is dyspnea.  ECG Impression: No significant ST segment change suggestive of ischemia.  Comparison with Prior Nuclear Study: No images to compare  Overall Impression: Low risk stress nuclear study with no significant areas of ischemia identified..  LV Ejection Fraction: Study not gated.  Donato Schultz, MD  Echocardiogram : 02/06/2014   - Procedure narrative: Limited study for LV function. - Left ventricle: The cavity size was mildly dilated. Wall thickness was normal. Systolic function was normal. The estimated ejection fraction was in the range of 50% to 55%. - Ventricular septum: Incoordinate distal septal motion.  Impressions: - Compared to the prior echo in 11/2013, the EF has improved from 35-40% up to 50-55%.  TTE: 11/22/14 - Left ventricle: The cavity size was normal. Systolic function was normal. The estimated ejection fraction was in the  range of 50% to 55%. There is severe hypokinesis of the entireinferior myocardium. There was an increased relative contribution of atrial contraction to ventricular filling. Doppler parameters are consistent with abnormal left ventricular relaxation (grade 1 diastolic dysfunction). - Aortic valve: Trileaflet; normal thickness, mildly calcified leaflets. There was trivial regurgitation. - Left atrium: The atrium was mildly dilated.  TTE: 07/02/2016 - Left ventricle: The cavity size was mildly dilated. Systolic   function was mildly reduced. The estimated ejection fraction was   in the range of 45% to 50%. Diffuse hypokinesis. There is   hypokinesis of the entireinferior myocardium. There was an   increased relative contribution of atrial contraction  to   ventricular filling. Doppler parameters are consistent with   abnormal left ventricular relaxation (grade 1 diastolic   dysfunction). - Aortic valve: Trileaflet; normal thickness, mildly calcified   leaflets. There was mild regurgitation. Regurgitation pressure   half-time: 527 ms. - Mitral valve: There was trivial regurgitation. - Atrial septum: There was increased thickness of the septum,   consistent with lipomatous hypertrophy.  EKG performed today 01/10/2017 was personally reviewed and shows normal sinus rhythm with nonspecific T-wave abnormalities unchanged from prior.    Assessment & Plan   1. Acute on chronic combined systolic and diastolic CHF  - Most recent LVEF 30 to 35%, she limits herself with not willingness to take her medications, we will restart Benicar 20 mg daily and start eplerenone 12.5 mg daily. We will have her follow her with her pharmacy in 2 weeks with BMP at that time. I worry if she continues not to take her medications that her LVEF will continue to drop, we will repeat echocardiogram in December of this year, if her LVEF persistently below 35% will refer for ICD implantation.  2. Palpitations -  48 hour Holter monitor showed only frequent PVCs and PACs. Now resolved.   3. Hypertension -restart Benicar, start eplerenone 12.5 mg daily.  4. Lipids - at goal in 12/2016 and no evidence of coronary artery disease  Follow-up in 2 weeks with labs with pharmacy, with me in 3 months.  Lars Masson, MD, [facility]  01/26/2018

## 2018-02-15 ENCOUNTER — Ambulatory Visit (INDEPENDENT_AMBULATORY_CARE_PROVIDER_SITE_OTHER): Payer: Managed Care, Other (non HMO) | Admitting: Pharmacist

## 2018-02-15 ENCOUNTER — Encounter: Payer: Self-pay | Admitting: Pharmacist

## 2018-02-15 VITALS — BP 122/74 | HR 56

## 2018-02-15 DIAGNOSIS — I10 Essential (primary) hypertension: Secondary | ICD-10-CM

## 2018-02-15 LAB — BASIC METABOLIC PANEL
BUN/Creatinine Ratio: 16 (ref 12–28)
BUN: 16 mg/dL (ref 8–27)
CO2: 24 mmol/L (ref 20–29)
Calcium: 9.6 mg/dL (ref 8.7–10.3)
Chloride: 103 mmol/L (ref 96–106)
Creatinine, Ser: 1.03 mg/dL — ABNORMAL HIGH (ref 0.57–1.00)
GFR calc Af Amer: 67 mL/min/{1.73_m2} (ref >59)
GFR calc non Af Amer: 58 mL/min/{1.73_m2} — ABNORMAL LOW (ref >59)
Glucose: 79 mg/dL (ref 65–99)
Potassium: 4.5 mmol/L (ref 3.5–5.2)
Sodium: 141 mmol/L (ref 134–144)

## 2018-02-15 NOTE — Patient Instructions (Signed)
Return for a follow up appointment in 2-3 weeks  Go to the lab today for BMET  Check your blood pressure at home daily (if able) and keep record of the readings.  Take your BP meds as follows: CONTINUE olmesartan 20mg  daily in the morning  Pending lab work will increase eplerenone to 25mg  (1 tablet) daily in the evening   Bring all of your meds, your BP cuff and your record of home blood pressures to your next appointment.  Exercise as you're able, try to walk approximately 30 minutes per day.  Keep salt intake to a minimum, especially watch canned and prepared boxed foods.  Eat more fresh fruits and vegetables and fewer canned items.  Avoid eating in fast food restaurants.    HOW TO TAKE YOUR BLOOD PRESSURE: . Rest 5 minutes before taking your blood pressure. .  Don't smoke or drink caffeinated beverages for at least 30 minutes before. . Take your blood pressure before (not after) you eat. . Sit comfortably with your back supported and both feet on the floor (don't cross your legs). . Elevate your arm to heart level on a table or a desk. . Use the proper sized cuff. It should fit smoothly and snugly around your bare upper arm. There should be enough room to slip a fingertip under the cuff. The bottom edge of the cuff should be 1 inch above the crease of the elbow. . Ideally, take 3 measurements at one sitting and record the average.

## 2018-02-15 NOTE — Progress Notes (Signed)
Patient ID: [name]                 DOB: [birth year]                      MRN: [id number]     HPI: [name] a 62 y.o.femalereferred by Dr. Nelsontopharmacyclinicfor HF medication optimization.PMH is significant for family history of CAD, palpitations, and gastric reflux. Pt underwent echo in July 2019 and LVEF was 30-35%. Her olmesartan was stopped and Entresto 24-26mg  BID was started. Since this time she has stopped Entresto due to side effects and was restarted on olmesartan and started eplerenone.   Patient presents with husband for HF management/hypertension. She stopped Toprol first and was still dizziness and then went off the [place]. She states that the [facility] made her feel so poorly.  She reports that she has been tolerating the eplerenone and olmesartan well so far. She does get dizzy in the morning occasionally. She denies chest pain, sob, and headache. She does report that she is only able to tolerate brand name Benicar. She tried the generic and it did not work for her.   Current HF meds: Olmesartan (BRAND NAME ONLY) 20mg  daily in the morning and eplerenone 12.5mg  daily in the evening  Previously tried:spironolactone -heart fluttering, took for 2 weeks; valsartan and lisinopril, Entresto (made her feel vertigo), metoprolol (feels poorly)  BP goal:<130/61mmHg  Family History:Mother with MI at age 13.  Social History:Denies tobacco, alcohol, and illicit drug use.  Diet:Breakfast - cereal or oatmeal. Lunch - salads or baked potato. Dinner - grilled chicken. Rarely eats red meat. Snacks on cottage cheese, yogurt, fruit. Does not add salt to food.  Exercise:She has been starting walking 2-3 miles per day.   Home BP readings:Has not been checking but does have a BP cuff BP cuff broke, but will buy a new one.     Wt Readings from Last 3 Encounters:  06/09/17 152 lb (68.9 kg)  01/10/17 151 lb (68.5 kg)  06/18/16 150 lb (68 kg)       BP Readings from Last 3 Encounters:  11/08/17 134/66  06/09/17 130/78  01/10/17 124/66      Pulse Readings from Last 3 Encounters:  11/08/17 63  06/09/17 62  01/10/17 62    Renal function: CrCl cannot be calculated (Patient's most recent lab result is older than the maximum 21 days allowed.).      Past Medical History:  Diagnosis Date  . Esophageal reflux   . Osteoporosis   . Palpitations           Current Outpatient Medications on File Prior to Visit  Medication Sig Dispense Refill  . ibandronate (BONIVA) 150 MG tablet Take 150 mg by mouth every 30 (thirty) days.     . metoprolol succinate (TOPROL XL) 25 MG 24 hr tablet Take 0.5 tablets (12.5 mg total) by mouth daily. 15 tablet 11  . PREMARIN vaginal cream Place vaginally as directed.   0  . RaNITidine HCl (ZANTAC PO) Take by mouth as needed.    . sacubitril-valsartan (ENTRESTO) 24-26 MG Take 1 tablet by mouth 2 (two) times daily. 60 tablet 11   No current facility-administered medications on file prior to visit.          Allergies  Allergen Reactions  . Cefdinir   . Aldactone [Spironolactone] Palpitations    Pt states causes palpitations     Assessment/Plan:  1. Hypertension - BMET today. Unfortunately  I not sure that we will be able to fully optimize medications due to intolerances and soft pressures. She is willing to increase her eplerenone. Pending BMET results will increase to 25mg  once daily. Continue Benicar as prescribed with 20mg  daily. Follow up in 2-3 weeks.   Thank you,  Freddie Apley. Cleatis Polka, PharmD  [facility]  12/01/2017 10:27 AM  ADDENDUM: BMET back WNL. Will proceed with plan as above.

## 2018-02-16 ENCOUNTER — Telehealth: Payer: Self-pay | Admitting: Pharmacist

## 2018-02-16 DIAGNOSIS — R9430 Abnormal result of cardiovascular function study, unspecified: Secondary | ICD-10-CM

## 2018-02-16 DIAGNOSIS — I10 Essential (primary) hypertension: Secondary | ICD-10-CM

## 2018-02-16 DIAGNOSIS — I420 Dilated cardiomyopathy: Secondary | ICD-10-CM

## 2018-02-16 DIAGNOSIS — I5022 Chronic systolic (congestive) heart failure: Secondary | ICD-10-CM

## 2018-02-16 DIAGNOSIS — I5023 Acute on chronic systolic (congestive) heart failure: Secondary | ICD-10-CM

## 2018-02-16 MED ORDER — EPLERENONE 25 MG PO TABS: 25.0000 mg | ORAL_TABLET | Freq: Every day | ORAL | 3 refills | Status: DC

## 2018-02-16 NOTE — Telephone Encounter (Signed)
Spoke with pt and aware of medication change. Will repeat Bmet with HTN clinic follow up.

## 2018-03-13 ENCOUNTER — Ambulatory Visit (HOSPITAL_COMMUNITY): Payer: Managed Care, Other (non HMO) | Attending: Internal Medicine

## 2018-03-13 ENCOUNTER — Other Ambulatory Visit: Payer: Managed Care, Other (non HMO) | Admitting: *Deleted

## 2018-03-13 ENCOUNTER — Encounter: Payer: Self-pay | Admitting: Pharmacist

## 2018-03-13 ENCOUNTER — Ambulatory Visit (INDEPENDENT_AMBULATORY_CARE_PROVIDER_SITE_OTHER): Payer: Managed Care, Other (non HMO) | Admitting: Pharmacist

## 2018-03-13 ENCOUNTER — Other Ambulatory Visit: Payer: Self-pay

## 2018-03-13 VITALS — BP 126/78 | HR 54

## 2018-03-13 DIAGNOSIS — I5022 Chronic systolic (congestive) heart failure: Secondary | ICD-10-CM

## 2018-03-13 DIAGNOSIS — I10 Essential (primary) hypertension: Secondary | ICD-10-CM

## 2018-03-13 DIAGNOSIS — I5023 Acute on chronic systolic (congestive) heart failure: Secondary | ICD-10-CM

## 2018-03-13 DIAGNOSIS — I420 Dilated cardiomyopathy: Secondary | ICD-10-CM | POA: Diagnosis not present

## 2018-03-13 DIAGNOSIS — R9430 Abnormal result of cardiovascular function study, unspecified: Secondary | ICD-10-CM | POA: Diagnosis not present

## 2018-03-13 LAB — BASIC METABOLIC PANEL
BUN/Creatinine Ratio: 16 (ref 12–28)
BUN: 16 mg/dL (ref 8–27)
CO2: 24 mmol/L (ref 20–29)
Calcium: 9.6 mg/dL (ref 8.7–10.3)
Chloride: 105 mmol/L (ref 96–106)
Creatinine, Ser: 0.97 mg/dL (ref 0.57–1.00)
GFR calc Af Amer: 72 mL/min/{1.73_m2} (ref >59)
GFR calc non Af Amer: 62 mL/min/{1.73_m2} (ref >59)
Glucose: 94 mg/dL (ref 65–99)
Potassium: 5.2 mmol/L (ref 3.5–5.2)
Sodium: 140 mmol/L (ref 134–144)

## 2018-03-13 NOTE — Progress Notes (Signed)
Patient ID: [name]                 DOB: 09-13-1955                      MRN: [id number]     HPI: [name] a 62 y.o.femalereferred by Dr. Nelsontopharmacyclinicfor HF medication optimization.PMH is significant for family history of CAD, palpitations, and gastric reflux. Pt underwent echo in July 2019 and LVEF was 30-35%. Her olmesartan was stopped and Entresto 24-26mg  BID was started. Since this time she has stopped Entresto due to side effects and was restarted on olmesartan and started eplerenone. At her last visit in pharmacy clinic her eplerenone was increased.   Patient presents with husband for HF management/hypertension follow up. She feels well overall. She has been active at church recently helping with christmas boxes. She states dizziness has improved significantly on her current regimen. She has been taking both medications (Benicar and eplerenone) as prescribed.  Current HF meds: Olmesartan (BRAND NAME ONLY) 20mg  daily in the morning and eplerenone 25mg  daily in the evening  Previously tried:spironolactone -heart fluttering, took for 2 weeks; valsartan and lisinopril (palpitations and feeling poorly), Entresto (made her feel vertigo), metoprolol (feels poorly)  BP goal:<130/85mmHg  Family History:Mother with MI at age 63.  Social History:Denies tobacco, alcohol, and illicit drug use.  Diet:Breakfast - cereal or oatmeal. Lunch - salads or baked potato. Dinner - grilled chicken. Rarely eats red meat. Snacks on cottage cheese, yogurt, fruit. Does not add salt to food.  Exercise:She has been starting walking 2-3 miles per day.   Home BP readings:she has not checked her blood pressure at all since last visit.      Wt Readings from Last 3 Encounters:  06/09/17 152 lb (68.9 kg)  01/10/17 151 lb (68.5 kg)  06/18/16 150 lb (68 kg)      BP Readings from Last 3 Encounters:  11/08/17 134/66  06/09/17 130/78  01/10/17 124/66        Pulse Readings from Last 3 Encounters:  11/08/17 63  06/09/17 62  01/10/17 62    Renal function: CrCl cannot be calculated (Patient's most recent lab result is older than the maximum 21 days allowed.).      Past Medical History:  Diagnosis Date  . Esophageal reflux   . Osteoporosis   . Palpitations          Allergies  Allergen Reactions  . Cefdinir   . Aldactone [Spironolactone] Palpitations    Pt states causes palpitations     Assessment/Plan:  1. Hypertension - BMET today. Pending results will continue medication therapy as prescribed. Follow up with Dr. Delton See as planned and pharmacy clinic if needed.   Thank you,  Freddie Apley. Cleatis Polka, PharmD  [facility]

## 2018-03-13 NOTE — Patient Instructions (Signed)
Return for a follow up appointment as scheduled with Dr. Delton See  Go to the lab today for BMET  Check your blood pressure at home daily (if able) and keep record of the readings.  Take your BP meds as follows: CONTINUE all as prescribed.  Bring all of your meds, your BP cuff and your record of home blood pressures to your next appointment.  Exercise as you're able, try to walk approximately 30 minutes per day.  Keep salt intake to a minimum, especially watch canned and prepared boxed foods.  Eat more fresh fruits and vegetables and fewer canned items.  Avoid eating in fast food restaurants.    HOW TO TAKE YOUR BLOOD PRESSURE: . Rest 5 minutes before taking your blood pressure. .  Don't smoke or drink caffeinated beverages for at least 30 minutes before. . Take your blood pressure before (not after) you eat. . Sit comfortably with your back supported and both feet on the floor (don't cross your legs). . Elevate your arm to heart level on a table or a desk. . Use the proper sized cuff. It should fit smoothly and snugly around your bare upper arm. There should be enough room to slip a fingertip under the cuff. The bottom edge of the cuff should be 1 inch above the crease of the elbow. . Ideally, take 3 measurements at one sitting and record the average.

## 2018-03-14 ENCOUNTER — Telehealth: Payer: Self-pay | Admitting: Cardiology

## 2018-03-14 ENCOUNTER — Telehealth: Payer: Self-pay | Admitting: Pharmacist

## 2018-03-14 DIAGNOSIS — I5022 Chronic systolic (congestive) heart failure: Secondary | ICD-10-CM

## 2018-03-14 DIAGNOSIS — I420 Dilated cardiomyopathy: Secondary | ICD-10-CM

## 2018-03-14 DIAGNOSIS — I10 Essential (primary) hypertension: Secondary | ICD-10-CM

## 2018-03-14 DIAGNOSIS — R9430 Abnormal result of cardiovascular function study, unspecified: Secondary | ICD-10-CM

## 2018-03-14 DIAGNOSIS — I5023 Acute on chronic systolic (congestive) heart failure: Secondary | ICD-10-CM

## 2018-03-14 NOTE — Telephone Encounter (Signed)
-----   Message from Loa Socks, LPN sent at 95/12/7469 10:28 AM EST -----   ----- Message ----- From: Nell Range Lab Results In Sent: 03/13/2018   3:35 PM EST To: Lars Masson, MD

## 2018-03-14 NOTE — Telephone Encounter (Signed)
Patient is returning call.  °

## 2018-03-14 NOTE — Telephone Encounter (Signed)
Attempted to call the pt back and she did not answer.

## 2018-03-15 NOTE — Telephone Encounter (Signed)
Notes recorded by Lars Masson, MD on 03/14/2018 at 1:11 PM EST Normal LV size with EF 30-35%, because her LVEF remains severely decreased she is at a risk of Ventricular arrhythmias that might be life threatening and needs to be referred for an ICD. If she agrees please refer to EP.

## 2018-03-15 NOTE — Telephone Encounter (Signed)
Spoke with the pt and endorsed to her echo results and recommendations per Dr Delton See.  Endorsed the pt that I will place the referral in the system and have our EP Scheduler Glynda Jaeger, call her back to arrange this appt. Pt verbalized understanding and agrees with this plan.

## 2018-03-15 NOTE — Telephone Encounter (Signed)
Follow up;   Patient returning call back from yesterday. please call patient in cell.

## 2018-03-15 NOTE — Telephone Encounter (Signed)
Potassium trending up on eplerenone. Will plan to repeat in 2 weeks and advise pt to limit food high in potassium. Will continue current dose of eplerenone for now, especially given recent ECHO and intolerances to other evidence based therapy.   Spoke with patient and she is aware of the above. Will come for repeat labs on 03/28/18.

## 2018-03-28 ENCOUNTER — Other Ambulatory Visit: Payer: Managed Care, Other (non HMO)

## 2018-03-29 ENCOUNTER — Other Ambulatory Visit: Payer: Managed Care, Other (non HMO)

## 2018-03-29 DIAGNOSIS — I5022 Chronic systolic (congestive) heart failure: Secondary | ICD-10-CM

## 2018-03-29 DIAGNOSIS — I10 Essential (primary) hypertension: Secondary | ICD-10-CM

## 2018-03-29 LAB — BASIC METABOLIC PANEL
BUN/Creatinine Ratio: 16 (ref 12–28)
BUN: 16 mg/dL (ref 8–27)
CO2: 24 mmol/L (ref 20–29)
Calcium: 9.7 mg/dL (ref 8.7–10.3)
Chloride: 103 mmol/L (ref 96–106)
Creatinine, Ser: 1.03 mg/dL — ABNORMAL HIGH (ref 0.57–1.00)
GFR calc Af Amer: 67 mL/min/{1.73_m2} (ref >59)
GFR calc non Af Amer: 58 mL/min/{1.73_m2} — ABNORMAL LOW (ref >59)
Glucose: 88 mg/dL (ref 65–99)
Potassium: 5.6 mmol/L — ABNORMAL HIGH (ref 3.5–5.2)
Sodium: 141 mmol/L (ref 134–144)

## 2018-03-30 ENCOUNTER — Telehealth: Payer: Self-pay | Admitting: *Deleted

## 2018-03-30 DIAGNOSIS — E875 Hyperkalemia: Secondary | ICD-10-CM

## 2018-03-30 MED ORDER — EPLERENONE 25 MG PO TABS: 12.5000 mg | ORAL_TABLET | Freq: Every day | ORAL | 1 refills | Status: DC

## 2018-03-30 NOTE — Telephone Encounter (Signed)
Spoke with the pt and endorsed to her, her lab results and recommendations, per Prudence Davidson PharmD.  Endorsed to the pt that we will decrease her Eplerenone to 12.5 mg po daily and she will need to come in for a repeat BMET in 2 weeks.  Pt states she has enough supply of the eplerenone 25 mg tabs on hand, and she will just cut those in half.  Pt requested her repeat BMET to be done on 04/17/2018, for she will be out of town around that time, due to a new grandchild being born.  Scheduled the pts BMET for 04/17/18.  Pt verbalized understanding and agrees with this plan.

## 2018-03-30 NOTE — Telephone Encounter (Signed)
-----   Message from Levin Bacon, [facility] sent at 03/30/2018 12:08 PM EST ----- Would recommend decreasing dose of eplerenone to 12.5mg  once daily and recheck BMET in 2 weeks.

## 2018-04-06 ENCOUNTER — Ambulatory Visit (INDEPENDENT_AMBULATORY_CARE_PROVIDER_SITE_OTHER): Payer: Managed Care, Other (non HMO) | Admitting: Internal Medicine

## 2018-04-06 ENCOUNTER — Encounter: Payer: Self-pay | Admitting: Internal Medicine

## 2018-04-06 VITALS — BP 138/70 | HR 58 | Ht 63.0 in | Wt 154.0 lb

## 2018-04-06 DIAGNOSIS — R002 Palpitations: Secondary | ICD-10-CM | POA: Diagnosis not present

## 2018-04-06 DIAGNOSIS — I10 Essential (primary) hypertension: Secondary | ICD-10-CM

## 2018-04-06 DIAGNOSIS — I428 Other cardiomyopathies: Secondary | ICD-10-CM

## 2018-04-06 DIAGNOSIS — I5042 Chronic combined systolic (congestive) and diastolic (congestive) heart failure: Secondary | ICD-10-CM | POA: Diagnosis not present

## 2018-04-06 LAB — BASIC METABOLIC PANEL
BUN/Creatinine Ratio: 17 (ref 12–28)
BUN: 15 mg/dL (ref 8–27)
CO2: 22 mmol/L (ref 20–29)
Calcium: 9.4 mg/dL (ref 8.7–10.3)
Chloride: 105 mmol/L (ref 96–106)
Creatinine, Ser: 0.88 mg/dL (ref 0.57–1.00)
GFR calc Af Amer: 81 mL/min/{1.73_m2} (ref >59)
GFR, EST NON AFRICAN AMERICAN: 70 mL/min/{1.73_m2} (ref >59)
Glucose: 88 mg/dL (ref 65–99)
Potassium: 4.9 mmol/L (ref 3.5–5.2)
Sodium: 143 mmol/L (ref 134–144)

## 2018-04-06 MED ORDER — SACUBITRIL-VALSARTAN 24-26 MG PO TABS: 1.0000 | ORAL_TABLET | Freq: Two times a day (BID) | ORAL | 11 refills | Status: DC

## 2018-04-06 NOTE — Patient Instructions (Addendum)
Medication Instructions:  Your physician has recommended you make the following change in your medication:  1.  Stop taking Olmesarten (Benicar)  2.  Start taking your Entresto 24/26 one tablet by mouth twice a day  Labwork: You will get lab work today:  BMP    Testing/Procedures: Your physician has requested that you have a cardiac MRI. Cardiac MRI uses a computer to create images of your heart as its beating, producing both still and moving pictures of your heart and major blood vessels. For further information please visit [email or web address]. Please follow the instruction sheet given to you today for more information.  Please schedule a cardiac MRI IN 3 MONTHS  Follow-Up:  Your physician wants you to follow-up in: 3 months with Dr. Graciela Husbands after the cardiac MRI

## 2018-04-06 NOTE — Progress Notes (Signed)
ELECTROPHYSIOLOGY CONSULT NOTE  Patient ID: [name], MRN: [id number], DOB/AGE: 11-18-1955 62 y.o. Admit date: (Not on file) Date of Consult: 04/06/2018  Primary Physician: Lise AuerKhan, Jaber A, MD Primary Cardiologist: Marvis RepressKN     [name] is a 62 y.o. female who is being seen today for the evaluation of an ICD in setting of nonischemic cardiomyopathy at the request of KN.    HPI [name] is a 62 y.o. female  Referred for consideration of an ICD   As noted below, she has had intermittent cardiomyopathy.  There was transient normalization of LV function in the context of initiation of guideline directed therapy  She has few limitations in activity.  She denies shortness of breath with climbing stairs carrying groceries.  She is not particularly fit but walks a couple of miles a few days a week at about 3 miles an hour without difficulty.  No edema.  No nocturnal dyspnea orthopnea.  No palpitations or syncope.  Previous efforts to augment afterload reduction were challenging.  She was started on Entresto which she tolerated quite well for a number of weeks.  However, with the introduction of metoprolol significant dizziness ensued.it seemed more like vertigo, something she had had in the past.  The discontinuation of metoprolol and subsequently of Entresto was concurrent with amelioration of her symptoms  DATE TEST EF   5/15 Myoview  30-35% No Ischemia  6/15 Echo  30-35%   8/16 Echo  50-55%   7/19 Echo   30-35 %   12/19 Echo  30-35%         Past Medical History:  Diagnosis Date  . Esophageal reflux   . Osteoporosis   . Palpitations       Surgical History:  Past Surgical History:  Procedure Laterality Date  . VAGINAL HYSTERECTOMY       Home Meds: Current Meds  Medication Sig  . eplerenone (INSPRA) 25 MG tablet Take 0.5 tablets (12.5 mg total) by mouth daily.  Marland Kitchen. PREMARIN vaginal cream Place vaginally as directed.   . [DISCONTINUED] BENICAR 20  MG tablet Take 1 tablet (20 mg total) by mouth daily.    Allergies:  Allergies  Allergen Reactions  . Cefdinir   . Entresto [Sacubitril-Valsartan] Other (See Comments)    Pt reports causes "dizziness and vertigo."  . Aldactone [Spironolactone] Palpitations    Pt states causes palpitations    Social History   Socioeconomic History  . Marital status: Married    Spouse name: Not on file  . Number of children: Not on file  . Years of education: Not on file  . Highest education level: Not on file  Occupational History  . Not on file  Social Needs  . Financial resource strain: Not on file  . Food insecurity:    Worry: Not on file    Inability: Not on file  . Transportation needs:    Medical: Not on file    Non-medical: Not on file  Tobacco Use  . Smoking status: Never Smoker  . Smokeless tobacco: Never Used  Substance and Sexual Activity  . Alcohol use: No  . Drug use: No  . Sexual activity: Not on file  Lifestyle  . Physical activity:    Days per week: Not on file    Minutes per session: Not on file  . Stress: Not on file  Relationships  . Social connections:    Talks on phone: Not on file  Gets together: Not on file    Attends religious service: Not on file    Active member of club or organization: Not on file    Attends meetings of clubs or organizations: Not on file    Relationship status: Not on file  . Intimate partner violence:    Fear of current or ex partner: Not on file    Emotionally abused: Not on file    Physically abused: Not on file    Forced sexual activity: Not on file  Other Topics Concern  . Not on file  Social History Narrative  . Not on file     Family History  Problem Relation Age of Onset  . Heart attack Mother 23  . Diabetes Maternal Uncle   . CAD Unknown        multiple family members  . Thyroid disease Neg Hx   . Stroke Neg Hx      ROS:  Please see the history of present illness.     All other systems reviewed and negative.     Physical Exam: Blood pressure 138/70, pulse (!) 58, height 5\' 3"  (1.6 m), weight 154 lb (69.9 kg), SpO2 99 %. General: Well developed, well nourished female in no acute distress. Head: Normocephalic, atraumatic, sclera non-icteric, no xanthomas, nares are without discharge. EENT: normal  Lymph Nodes:  none Neck: Negative for carotid bruits. JVD not elevated. Back:without scoliosis kyphosis Lungs: Clear bilaterally to auscultation without wheezes, rales, or rhonchi. Breathing is unlabored. Heart: RRR with S1 S2. No murmur . No rubs, or gallops appreciated. Abdomen: Soft, non-tender, non-distended with normoactive bowel sounds. No hepatomegaly. No rebound/guarding. No obvious abdominal masses. Msk:  Strength and tone appear normal for age. Extremities: No clubbing or cyanosis edema.  Distal pedal pulses are 2+ and equal bilaterally. Skin: Warm and Dry Neuro: Alert and oriented X 3. CN III-XII intact Grossly normal sensory and motor function . Psych:  Responds to questions appropriately with a normal affect.      Labs: Cardiac Enzymes No results for input(s): CKTOTAL, CKMB, TROPONINI in the last 72 hours. CBC Lab Results  Component Value Date   WBC 5.6 12/28/2016   HGB 12.2 12/28/2016   HCT 35.1 12/28/2016   MCV 92 12/28/2016   PLT 267 12/28/2016   PROTIME: No results for input(s): LABPROT, INR in the last 72 hours. Chemistry  Recent Labs  Lab 04/06/18 1150  NA 143  K 4.9  CL 105  CO2 22  BUN 15  CREATININE 0.88  CALCIUM 9.4  GLUCOSE 88   Lipids Lab Results  Component Value Date   CHOL 182 12/28/2016   HDL 64 12/28/2016   LDLCALC 102 (H) 12/28/2016   TRIG 78 12/28/2016   BNP Pro B Natriuretic peptide (BNP)  Date/Time Value Ref Range Status  09/11/2013 07:55 AM 18.0 0.0 - 100.0 pg/mL Final   Thyroid Function Tests: No results for input(s): TSH, T4TOTAL, T3FREE, THYROIDAB in the last 72 hours.  Invalid input(s): FREET3 Miscellaneous No results found  for: DDIMER  Radiology/Studies:  No results found.  EKG: NSR @ 60 03/22/39   Assessment and Plan:   Cardiomyopathy presumed nonischemic  Cardiomyopathy-family history  Congestive heart failure class Ib-2A  GE reflux on Entresto  Hyperkalemia   The patient has persistent left ventricular dysfunction in the context of a presumed nonischemic myopathy.  There is been variable ejection fraction.  She was able to tolerate Entresto until it was joint with metoprolol; she ended up stopping  both.  I think it is worth trying to get her back on Entresto.  She is willing.  We have reviewed side effects.  She will discontinue it if she has recurrent problems which included both lightheadedness as well as GE reflux disease  We need to check a metabolic profile today we will need to check it again in about 2 weeks time  Following the initiation of Entresto, we will anticipate cMRI in about 3 months.  Her functional status is exceedingly good; even in the event that she had depressed LV function in 3 months, I would submit her for CPX testing to see whether she is class II so to justify consideration of an ICD.  QRS duration is normal thus removing 1 of the indications for moving forward with therapy   Sherryl Manges

## 2018-04-10 ENCOUNTER — Telehealth: Payer: Self-pay | Admitting: Internal Medicine

## 2018-04-10 NOTE — Telephone Encounter (Signed)
° °  Patient wants to confirm the directions for sacubitril-valsartan (ENTRESTO) 24-26 MG.  Please call, ok to leave detailed message

## 2018-04-10 NOTE — Telephone Encounter (Signed)
Pt calling to clarify the correct dose and times she should be taking her Entresto. We discussed it is a bid medication. Pt verified she stopped her ARB for greater than 36hrs before beginning her Entresto as well. Pt did not have any additional questions.

## 2018-04-17 ENCOUNTER — Other Ambulatory Visit: Payer: Managed Care, Other (non HMO)

## 2018-05-01 ENCOUNTER — Telehealth: Payer: Self-pay | Admitting: Internal Medicine

## 2018-05-01 DIAGNOSIS — I5043 Acute on chronic combined systolic (congestive) and diastolic (congestive) heart failure: Secondary | ICD-10-CM

## 2018-05-01 NOTE — Telephone Encounter (Signed)
Patient states she stopped taking Entresto on Saturday because her heart felt it was beating "hard".  She is still taking Eplerenone 25 mg.  She would  like to speak with Dr. Odessa Fleming nurse.

## 2018-05-01 NOTE — Telephone Encounter (Signed)
Pt calling today to tell us she does not believe she has been tolerating Entresto well. She states her heart has been "beating really hard, even when she is relaxed in bed." She states she feels her heart is squeezing harder and it does not feel "right." She states when she stopped her Entresto, these symptoms subsided. She did not know if her heart rate was elevated or not.  I advised pt to stay off her entresto until Dr Graciela Husbands gives recommendations. Pt verbalized understanding and will await a return call.

## 2018-05-03 ENCOUNTER — Telehealth: Payer: Self-pay | Admitting: *Deleted

## 2018-05-03 MED ORDER — OLMESARTAN MEDOXOMIL 20 MG PO TABS: 20.0000 mg | ORAL_TABLET | Freq: Every day | ORAL | 3 refills | Status: DC

## 2018-05-03 NOTE — Telephone Encounter (Signed)
Sent this message to Dr Delton See as a general FYI.

## 2018-05-03 NOTE — Telephone Encounter (Signed)
-----   Message from Bridget Milch, RN sent at 05/03/2018  9:52 AM EST ----- Regarding: [name] follow up [facility] [name]  Dr [name] saw this pt in December as an ICD consult. She will be following up with him regarding possible ICD implant, but she does not have a recall in for Dr [name]. Also, he placed her on Entresto, which she didn't tolerate. He placed her back on her Benicar for now.   I just wanted you to know.  Thanks! [name]

## 2018-05-03 NOTE — Telephone Encounter (Signed)
Per Dr Graciela Husbands, he would like for pt to start back on Benicar 20mg  qd. I advised pt I would speak with Dr Graciela Husbands regarding the cardiac MRI he ordered in anticipation of pt having tolerated Entresto. Dr Graciela Husbands also mentioned a GXT to be performed before making a decision on ICD implant. Pt verbalized understanding and had no additional questions.

## 2018-05-03 NOTE — Addendum Note (Signed)
Addended by: Oretha Milch on: 05/03/2018 09:52 AM   Modules accepted: Orders

## 2018-05-08 ENCOUNTER — Telehealth: Payer: Self-pay | Admitting: Cardiology

## 2018-05-08 NOTE — Telephone Encounter (Signed)
New Message       Patient is calling today to see   if she is suppose to have her MRI  In March and not Feburary, patient has had a couple dates, but she is sure that she should be doing something in March. Pls call to advise.

## 2018-05-10 ENCOUNTER — Other Ambulatory Visit (HOSPITAL_COMMUNITY): Payer: Managed Care, Other (non HMO)

## 2018-05-11 MED ORDER — CARVEDILOL 3.125 MG PO TABS: 3.1250 mg | ORAL_TABLET | Freq: Two times a day (BID) | ORAL | 3 refills | Status: DC

## 2018-05-11 NOTE — Telephone Encounter (Signed)
Per Dr Graciela Husbands, he would like pt to begin Carvedilol 3.125mg  bid. After she has been on carvedilol for two weeks, pt should have a CPX performed. Pt understands Dr Graciela Husbands is trying to determine if she may be overcompensating her HF symptoms, as she states she has no typical HF symptoms. Pt would not qualify for ICD implant in this case. CPX should be able to determine the extent of her HF during exercise.  Pt states she has not tolerated a beta blocker in the past (metoprolol.) I advised her to give it some time and symptoms may resolve. She will monitor her blood pressure for the next couple of weeks as well. She will call the office if she does not tolerated her Carvedilol.   Pt has verbalized understanding and agreed with the above plan. She will call with any additional questions or concerns.

## 2018-05-11 NOTE — Addendum Note (Signed)
Addended by: Oretha Milch on: 05/11/2018 11:26 AM   Modules accepted: Orders

## 2018-05-22 ENCOUNTER — Other Ambulatory Visit (HOSPITAL_COMMUNITY): Payer: Managed Care, Other (non HMO)

## 2018-05-29 ENCOUNTER — Ambulatory Visit (HOSPITAL_COMMUNITY): Payer: Managed Care, Other (non HMO) | Attending: Internal Medicine

## 2018-05-29 DIAGNOSIS — I5043 Acute on chronic combined systolic (congestive) and diastolic (congestive) heart failure: Secondary | ICD-10-CM

## 2018-06-08 ENCOUNTER — Telehealth: Payer: Self-pay | Admitting: *Deleted

## 2018-06-08 ENCOUNTER — Telehealth: Payer: Self-pay

## 2018-06-08 NOTE — Telephone Encounter (Signed)
Pt aware of PFT results and recommendations. She understands Cardiac MRI can be canceled for now and follow up with Dr Graciela Husbands as needed.   She would like to set up an appointment with Dr Delton See. I will message Dr Lindaann Slough nurse to follow up.   She had no additional questions of concerns.

## 2018-06-08 NOTE — Telephone Encounter (Signed)
-----   Message from Oretha Milch, RN sent at 06/08/2018 11:22 AM EST ----- Regarding: Follow up appt with Dr Arvil Chaco,  Dr Graciela Husbands will be seeing Ms Scianna prn for now. She would like to get set up to see Dr Delton See to follow up. I wanted to let you know.  Thanks :)  lorren

## 2018-06-08 NOTE — Telephone Encounter (Signed)
Called the pt and scheduled her for follow-up with Dr. Delton See on 08/10/18 at 1140.  Pt aware to arrive 15 mins prior to that appt.  Advised the pt to call as needed to the office, or if she has any symptoms between now and then.  Pt verbalized understanding and agrees with this plan.

## 2018-06-08 NOTE — Telephone Encounter (Signed)
-----   Message from Duke Salvia, MD sent at 06/05/2018  8:47 PM EST ----- Please Inform Patient that study was normal  Thanks  Aris Lot   Looks like she is stage 1 and with NICM would not qualify for an ICD At this point..  If her symptoms worsen, I would be glad to revisit  thx s

## 2018-07-20 ENCOUNTER — Telehealth: Payer: Self-pay | Admitting: Cardiology

## 2018-07-20 NOTE — Telephone Encounter (Signed)
New Message   Pt c/o medication issue:  1. Name of Medication: Benicar   2. How are you currently taking this medication (dosage and times per day)? 20mg  once a day  3. Are you having a reaction (difficulty breathing--STAT)? NO  4. What is your medication issue? Patient took last pill this morning and having trouble with the approval of it to pick I up from the pharmacy. Patient states she also can't take the generic brand and needs a letter from the nurse.

## 2018-07-20 NOTE — Telephone Encounter (Signed)
**Note De-Identified Nasier Thumm Obfuscation** Message received from covermymeds:  Milta Enzor Key: [id number] - PA Case ID: [id number] - Rx #: [id number] Outcome  Approvedtoday  Your PA request has been approved. Additional information will be provided in the approval communication. (Message 1145)  I have notified the pts pharmacy of this approval.

## 2018-07-20 NOTE — Telephone Encounter (Signed)
Pt is needing a prior auth to be able to get medication. Please address

## 2018-07-20 NOTE — Telephone Encounter (Signed)
**Note De-Identified [name] Obfuscation** I have done a Benicar PA through covermymeds. Key: [id number]

## 2018-07-21 NOTE — Telephone Encounter (Signed)
Letter received from [facility] stating that they have approved the pts Benicar PA. Approval good from 07/20/2018-07/20/2019. I have notified the pt and her pharmacy (had to leave a message after calling [facility] multiple times on VM as I was on hold for more than 15 mins waiting to s/w someone in the pharmacy) of this approval.

## 2018-07-27 ENCOUNTER — Encounter: Payer: Self-pay | Admitting: *Deleted

## 2018-07-27 ENCOUNTER — Telehealth: Payer: Self-pay | Admitting: *Deleted

## 2018-07-27 NOTE — Telephone Encounter (Signed)
Virtual Visit Pre-Appointment Phone Call  Steps For Call:  1. Confirm consent - "In the setting of the current Covid19 crisis, you are scheduled for a ( video) visit with Dr. Delton See on 4/30 at 11:40 am.  Just as we do with many in-office visits, in order for you to participate in this visit, we must obtain consent.  If you'd like, I can send this to your mychart (if signed up) or email for you to review.  Otherwise, I can obtain your verbal consent now.  All virtual visits are billed to your insurance company just like a normal visit would be.  By agreeing to a virtual visit, we'd like you to understand that the technology does not allow for your provider to perform an examination, and thus may limit your provider's ability to fully assess your condition.  Finally, though the technology is pretty good, we cannot assure that it will always work on either your or our end, and in the setting of a video visit, we may have to convert it to a phone-only visit.  In either situation, we cannot ensure that we have a secure connection.  Are you willing to proceed?" STAFF: Did the patient verbally acknowledge consent to telehealth visit? Document YES/NO here:  CONSENT TO TREAT SENT TO PTS ACTIVE MYCHART AND SHE IS AWARE TO REVIEW AND AGREE PRIOR TO HER APPOINTMENT WITH DR [name] ON 4/30.  2. Confirm the BEST phone number to call the day of the visit by including in appointment notes [phone]  3. Gave patient insructions for DOXIMITY USE FOR UPCOMING VIDEO VISIT THROUGH HER SMARTPHONE   4. Advise patient to be prepared with their blood pressure, heart rate, weight, any heart rhythm information, their current medicines, and a piece of paper and pen handy for any instructions they may receive the day of their visit  5. Inform patient they will receive a phone call 15 minutes prior to their appointment time (may be from unknown caller ID) so they should be prepared to answer  6. Confirm that appointment  type is correct in Epic appointment notes (VIDEO visit with Dr. Delton See on 4/30)     TELEPHONE CALL NOTE  [name] has been deemed a candidate for a follow-up tele-health visit to limit community exposure during the Covid-19 pandemic. I spoke with the patient via phone to ensure availability of phone/video source, confirm preferred email & phone number, and discuss instructions and expectations.  I reminded [name] to be prepared with any vital sign and/or heart rhythm information that could potentially be obtained via home monitoring, at the time of her visit. I reminded [name] to expect a phone call at the time of her visit if her visit.  Loa Socks, LPN 2/63/7858 85:02 AM  IF USING DOXIMITY or DOXY.ME - The patient will receive a link just prior to their visit, either by text or email (to be determined day of appointment depending on if it's doxy.me or Doximity). Pt will be using doximity through her smartphone and was instructed on how to use this.     FULL LENGTH CONSENT FOR TELE-HEALTH VISIT   I hereby voluntarily request, consent and authorize [facility] and its employed or contracted physicians, physician assistants, nurse practitioners or other licensed health care professionals (the Practitioner), to provide me with telemedicine health care services (the "Services") as deemed necessary by the treating Practitioner. I acknowledge and consent to receive the Services by the Practitioner via telemedicine.  I understand that the telemedicine visit will involve communicating with the Practitioner through live audiovisual communication technology and the disclosure of certain medical information by electronic transmission. I acknowledge that I have been given the opportunity to request an in-person assessment or other available alternative prior to the telemedicine visit and am voluntarily participating in the telemedicine visit.  I understand that I have  the right to withhold or withdraw my consent to the use of telemedicine in the course of my care at any time, without affecting my right to future care or treatment, and that the Practitioner or I may terminate the telemedicine visit at any time. I understand that I have the right to inspect all information obtained and/or recorded in the course of the telemedicine visit and may receive copies of available information for a reasonable fee.  I understand that some of the potential risks of receiving the Services via telemedicine include:  Marland Kitchen Delay or interruption in medical evaluation due to technological equipment failure or disruption; . Information transmitted may not be sufficient (e.g. poor resolution of images) to allow for appropriate medical decision making by the Practitioner; and/or  . In rare instances, security protocols could fail, causing a breach of personal health information.  Furthermore, I acknowledge that it is my responsibility to provide information about my medical history, conditions and care that is complete and accurate to the best of my ability. I acknowledge that Practitioner's advice, recommendations, and/or decision may be based on factors not within their control, such as incomplete or inaccurate data provided by me or distortions of diagnostic images or specimens that may result from electronic transmissions. I understand that the practice of medicine is not an exact science and that Practitioner makes no warranties or guarantees regarding treatment outcomes. I acknowledge that I will receive a copy of this consent concurrently upon execution via email to the email address I last provided but may also request a printed copy by calling the office of [facility].    I understand that my insurance will be billed for this visit.   I have read or had this consent read to me. . I understand the contents of this consent, which adequately explains the benefits and risks of the  Services being provided via telemedicine.  . I have been provided ample opportunity to ask questions regarding this consent and the Services and have had my questions answered to my satisfaction. . I give my informed consent for the services to be provided through the use of telemedicine in my medical care  By participating in this telemedicine visit I agree to the above.  PT IS AWARE THAT HER CONSENT TO TREAT  INFORMATION WILL BE SENT TO HER MYCHART ACCOUNT FOR HER TO REVIEW PRIOR TO HER APPOINTMENT WITH DR [name] ON 4/30.

## 2018-08-10 ENCOUNTER — Encounter: Payer: Self-pay | Admitting: *Deleted

## 2018-08-10 ENCOUNTER — Encounter: Payer: Self-pay | Admitting: Cardiology

## 2018-08-10 ENCOUNTER — Telehealth: Payer: Self-pay | Admitting: *Deleted

## 2018-08-10 ENCOUNTER — Other Ambulatory Visit: Payer: Self-pay

## 2018-08-10 ENCOUNTER — Telehealth (INDEPENDENT_AMBULATORY_CARE_PROVIDER_SITE_OTHER): Payer: Managed Care, Other (non HMO) | Admitting: Cardiology

## 2018-08-10 VITALS — BP 106/67 | HR 57 | Ht 63.0 in | Wt 150.0 lb

## 2018-08-10 DIAGNOSIS — E782 Mixed hyperlipidemia: Secondary | ICD-10-CM

## 2018-08-10 DIAGNOSIS — I10 Essential (primary) hypertension: Secondary | ICD-10-CM

## 2018-08-10 DIAGNOSIS — I110 Hypertensive heart disease with heart failure: Secondary | ICD-10-CM

## 2018-08-10 DIAGNOSIS — I5022 Chronic systolic (congestive) heart failure: Secondary | ICD-10-CM | POA: Diagnosis not present

## 2018-08-10 DIAGNOSIS — I472 Ventricular tachycardia, unspecified: Secondary | ICD-10-CM

## 2018-08-10 DIAGNOSIS — I428 Other cardiomyopathies: Secondary | ICD-10-CM | POA: Diagnosis not present

## 2018-08-10 DIAGNOSIS — I4729 Other ventricular tachycardia: Secondary | ICD-10-CM

## 2018-08-10 NOTE — Telephone Encounter (Signed)
-----   Message from Carmelina Paddock sent at 08/10/2018 12:23 PM EDT ----- Regarding: RE: PT NEEDS A CORONARY CT DONE BEFORE MAY IS UP This is approved just pending member outreach, Rosann Auerbach will call the patient to verify the facility due to it may be a higher cost.  They will give me an auth # once they get in touch with the patient  Thank you,  Morrie Sheldon ----- Message ----- From: Loa Socks, LPN Sent: 0/93/2355  12:02 PM EDT To: Link Snuffer, # Subject: PT NEEDS A CORONARY CT DONE BEFORE MAY IS UP   DR Delton See ORDERED FOR THIS PT TO HAVE A CORONARY CT DONE AND SHE WILL NEED TO HAVE THIS DONE BEFORE THE MONTH OF MAY IS OVER, DUE TO HER INSURANCE COVERAGE WILL CHANGE, AND THERE WILL BE A LAPSE TIME.  ORDER FOR CT IS IN THE SYSTEM AS WELL AS BMET. SHE HAS HER CT INSTRUCTIONS IN HER MYCHART ACCOUNT.  SHE IS AWARE THAT YOU WILL CALL HER TO ARRANGE.  CAN YOU PLEASE SCHEDULE THESE NEEDED TEST?  THANKS FOR ALL YOU DO, IVY

## 2018-08-10 NOTE — Progress Notes (Signed)
Virtual Visit via Video Note   This visit type was conducted due to national recommendations for restrictions regarding the COVID-19 Pandemic (e.g. social distancing) in an effort to limit this patient's exposure and mitigate transmission in our community.  Due to her co-morbid illnesses, this patient is at least at moderate risk for complications without adequate follow up.  This format is felt to be most appropriate for this patient at this time.  All issues noted in this document were discussed and addressed.  A limited physical exam was performed with this format.  Please refer to the patient's chart for her consent to telehealth for [facility].   Evaluation Performed:  Follow-up visit  Date:  08/10/2018   ID:  Bridget, [name] 1955-09-09, MRN [id number]  Patient Location: Home Provider Location: Office  PCP:  [name], MD  Cardiologist:  Tobias Alexander, MD  Electrophysiologist:  None   Chief Complaint:  Palpitations  History of Present Illness:    [name] is a 63 y.o. female with h/o non-ischemic cardiomyopathy (negative stress test), LVEF 30-35% improved to 50-55% in 2016, at some point then back to 30-35%. She is variable intolerant to multiple medication including Entresto, ACE inhibitors and ARBs.   She has significant family history of coronary artery disease her mother died of massive myocardial infarction at age of 83, there are multiple other family members with CAD. Patient states that her lipids are followed by her primary care physician and are at goal.  The patient was seen by Dr. Graciela Husbands in December of last year for consideration of ICD, he restarted Entresto but she was only able to tolerate it for about a week and then she quit.  He ordered a cardiopulmonary test, VO2 max 21. Dr Graciela Husbands stated: looks like she is stage 1 and with NICM would not qualify for an ICD.   Exercise testing with gas exchange demonstrates normal functional capacity when  compared to matched sedentary norms. There is no cardiopulmonary limitation. However, there was non-diagnostic ST- depression in lateral leads at peak exercise, with NSVT of approximately 15 consecutive seconds into passive recovery.     Today the patient states that she has been doing well, she does not have any chest pain or shortness of breath, she tries to walk on the treadmill for 30 minutes every day and on one occasion develop palpitations associated with dizziness toward the end of her exercise that made her stop.  She continued to feel uneasy for several minutes before it resolved.  The patient does not have symptoms concerning for COVID-19 infection (fever, chills, cough, or new shortness of breath).    Past Medical History:  Diagnosis Date  . Esophageal reflux   . Osteoporosis   . Palpitations    Past Surgical History:  Procedure Laterality Date  . VAGINAL HYSTERECTOMY       Current Meds  Medication Sig  . carvedilol (COREG) 3.125 MG tablet Take 3.125 mg by mouth 2 (two) times daily with a meal.  . eplerenone (INSPRA) 25 MG tablet Take 0.5 tablets (12.5 mg total) by mouth daily.  Marland Kitchen olmesartan (BENICAR) 20 MG tablet Take 1 tablet (20 mg total) by mouth daily.  Marland Kitchen PREMARIN vaginal cream Place vaginally as directed.      Allergies:   Cefdinir; Entresto [sacubitril-valsartan]; and Aldactone [spironolactone]   Social History   Tobacco Use  . Smoking status: Never Smoker  . Smokeless tobacco: Never Used  Substance Use Topics  .  Alcohol use: No  . Drug use: No     Family Hx: [name] family history includes CAD in her unknown relative; Diabetes in her maternal uncle; Heart attack (age of onset: 69) in her mother. There is no history of Thyroid disease or Stroke.  ROS:   Please see the history of present illness.    All other systems reviewed and are negative.   Prior CV studies:   The following studies were reviewed today:  TTE: 03/14/2019  - Left ventricle:  The cavity size was normal. Wall thickness was   normal. Systolic function was moderately to severely reduced. The   estimated ejection fraction was in the range of 30% to 35%.   Diffuse hypokinesis. Doppler parameters are consistent with   abnormal left ventricular relaxation (grade 1 diastolic   dysfunction). - Aortic valve: There was no stenosis. There was trivial   regurgitation. - Mitral valve: There was no significant regurgitation. - Left atrium: The atrium was mildly dilated. - Right ventricle: The cavity size was normal. Systolic function   was normal. - Pulmonary arteries: No complete TR doppler jet so unable to   estimate PA systolic pressure. - Inferior vena cava: The vessel was normal in size. The   respirophasic diameter changes were in the normal range (>= 50%),   consistent with normal central venous pressure.  Impressions: - Normal LV size with EF 30-35%, diffuse hypokinesis. Normal RV size and systolic function. No significant valvular  abnormalities.   CPX: 05/29/2018  Conclusion: Exercise testing with gas exchange demonstrates normal functional capacity when compared to matched sedentary norms. There is no cardiopulmonary limitation. However, there was non-diagnostic ST- depression in lateral leads at peak exercise, with NSVT of approximately 15 consecutive seconds into passive recovery. All of which remained asymptomatic.   Agree with above. Normal exercise capacity with no clear cardiopulmonary limitation to exercise. However ate peak exercise had positive ECG with ST depression in leads v5 and v6 with ventricular tachycardia (RBBB morphology) in recovery. Would consider further ischemic testing if this has not already been done.    Labs/Other Tests and Data Reviewed:    EKG:  No ECG reviewed.  Recent Labs: 04/06/2018: BUN 15; Creatinine, Ser 0.88; Potassium 4.9; Sodium 143   Recent Lipid Panel Lab Results  Component Value Date/Time   CHOL 182 12/28/2016  08:14 AM   TRIG 78 12/28/2016 08:14 AM   HDL 64 12/28/2016 08:14 AM   CHOLHDL 2.8 12/28/2016 08:14 AM   CHOLHDL 2.6 12/24/2015 08:45 AM   LDLCALC 102 (H) 12/28/2016 08:14 AM    Wt Readings from Last 3 Encounters:  08/10/18 150 lb (68 kg)  04/06/18 154 lb (69.9 kg)  01/26/18 153 lb (69.4 kg)     Objective:    Vital Signs:  BP 106/67   Pulse (!) 57   Ht 5\' 3"  (1.6 m)   Wt 150 lb (68 kg)   BMI 26.57 kg/m    VITAL SIGNS:  reviewed  ASSESSMENT & PLAN:    1. Acute on chronic combined systolic and diastolic CHF  - Most recent LVEF 30 to 35% -CPX test showed VO2 max of 21, normal functional capacity when compared to matched sedentary norms. -However  there was non-diagnostic ST- depression in lateral leads at peak exercise, with NSVT of approximately 15 consecutive seconds into passive recovery. All of which remained asymptomatic.  -She had another episode of palpitations with dizziness, we will obtain coronary CTA to evaluate for potential obstructive CAD.  2. Palpitations -  48 hour Holter monitor showed only frequent PVCs and PACs.  Now ventricular tachycardia on cardiopulmonary testing, will obtain ischemia evaluation..   3. Hypertension -restarted Benicar, start eplerenone 12.5 mg daily.  4. Lipids - at goal in 12/2016 and no evidence of coronary artery disease  COVID-19 Education: The signs and symptoms of COVID-19 were discussed with the patient and how to seek care for testing (follow up with PCP or arrange E-visit).  The importance of social distancing was discussed today.  Time:   Today, I have spent 25 minutes with the patient with telehealth technology discussing the above problems.     Medication Adjustments/Labs and Tests Ordered: Current medicines are reviewed at length with the patient today.  Concerns regarding medicines are outlined above.   Tests Ordered: Orders Placed This Encounter  Procedures  . CT CORONARY MORPH W/CTA COR W/SCORE W/CA W/CM &/OR  WO/CM  . CT CORONARY FRACTIONAL FLOW RESERVE DATA PREP  . CT CORONARY FRACTIONAL FLOW RESERVE FLUID ANALYSIS  . Basic metabolic panel    Medication Changes: No orders of the defined types were placed in this encounter.   Disposition:  Follow up in 6 month(s)  Signed, Tobias Alexander, MD  08/10/2018 12:26 PM    [facility]

## 2018-08-10 NOTE — Telephone Encounter (Signed)
-----   Message from Carmelina Paddock sent at 08/10/2018  1:30 PM EDT ----- Regarding: RE: PT NEEDS A CORONARY CT DONE BEFORE MAY IS UP Auth # [id number] exp 02/06/19 Ready to schedule  Thank you,  Morrie Sheldon ----- Message ----- From: Loa Socks, LPN Sent: 1/85/6314  12:02 PM EDT To: Link Snuffer, # Subject: PT NEEDS A CORONARY CT DONE BEFORE MAY IS UP   DR Delton See ORDERED FOR THIS PT TO HAVE A CORONARY CT DONE AND SHE WILL NEED TO HAVE THIS DONE BEFORE THE MONTH OF MAY IS OVER, DUE TO HER INSURANCE COVERAGE WILL CHANGE, AND THERE WILL BE A LAPSE TIME.  ORDER FOR CT IS IN THE SYSTEM AS WELL AS BMET. SHE HAS HER CT INSTRUCTIONS IN HER MYCHART ACCOUNT.  SHE IS AWARE THAT YOU WILL CALL HER TO ARRANGE.  CAN YOU PLEASE SCHEDULE THESE NEEDED TEST?  THANKS FOR ALL YOU DO, Rumi Taras

## 2018-08-10 NOTE — Patient Instructions (Signed)
Medication Instructions:   Your physician recommends that you continue on your current medications as directed. Please refer to the Current Medication list given to you today.  If you need a refill on your cardiac medications before your next appointment, please call your pharmacy.    Lab work:  BMET IN THE NEAR FUTURE--WE WILL ARRANGE THIS PRIOR TO WHEN YOUR CORONARY CT IS SCHEDULED, PER CT PROTOCOL.  If you have labs (blood work) drawn today and your tests are completely normal, you will receive your results only by: Marland Kitchen MyChart Message (if you have MyChart) OR . A paper copy in the mail If you have any lab test that is abnormal or we need to change your treatment, we will call you to review the results.    Testing/Procedures:  CORONARY CT  Please arrive at the [facility] main entrance of [facility] at xx:xx AM (30-45 minutes prior to test start time)  [facility] [address] [place], [place] [address] [phone]  Proceed to the [facility] Radiology Department (First Floor).  Please follow these instructions carefully (unless otherwise directed):   On the Night Before the Test: . Be sure to Drink plenty of water. . Do not consume any caffeinated/decaffeinated beverages or chocolate 12 hours prior to your test. . Do not take any antihistamines 12 hours prior to your test.   On the Day of the Test: . Drink plenty of water. Do not drink any water within one hour of the test. . Do not eat any food 4 hours prior to the test. . You may take your regular medications prior to the test.   *MAKE SURE YOU TAKE YOUR CARVEDILOL THE MORNING OF THIS CT        After the Test: . Drink plenty of water. . After receiving IV contrast, you may experience a mild flushed feeling. This is normal. . On occasion, you may experience a mild rash up to 24 hours after the test. This is not dangerous. If this occurs, you can take Benadryl 25 mg and increase your fluid  intake. . If you experience trouble breathing, this can be serious. If it is severe call 911 IMMEDIATELY. If it is mild, please call our office.    Follow-Up:  WITH DR Delton See AS A REGULAR OFFICE VISIT ON December 07, 2018 AT 11:40 AM

## 2018-08-14 ENCOUNTER — Other Ambulatory Visit: Payer: Self-pay | Admitting: Cardiology

## 2018-08-15 ENCOUNTER — Other Ambulatory Visit: Payer: Self-pay | Admitting: Internal Medicine

## 2018-08-15 MED ORDER — OLMESARTAN MEDOXOMIL 20 MG PO TABS: 20.0000 mg | ORAL_TABLET | Freq: Every day | ORAL | 2 refills | Status: DC

## 2018-08-21 ENCOUNTER — Telehealth (HOSPITAL_COMMUNITY): Payer: Self-pay | Admitting: Emergency Medicine

## 2018-08-21 ENCOUNTER — Encounter (HOSPITAL_COMMUNITY): Payer: Self-pay | Admitting: Emergency Medicine

## 2018-08-21 ENCOUNTER — Other Ambulatory Visit: Payer: Self-pay

## 2018-08-21 ENCOUNTER — Other Ambulatory Visit: Payer: Managed Care, Other (non HMO) | Admitting: *Deleted

## 2018-08-21 DIAGNOSIS — E875 Hyperkalemia: Secondary | ICD-10-CM

## 2018-08-21 LAB — BASIC METABOLIC PANEL
BUN/Creatinine Ratio: 20 (ref 12–28)
BUN: 20 mg/dL (ref 8–27)
CO2: 23 mmol/L (ref 20–29)
Calcium: 9.5 mg/dL (ref 8.7–10.3)
Chloride: 102 mmol/L (ref 96–106)
Creatinine, Ser: 1 mg/dL (ref 0.57–1.00)
GFR calc Af Amer: 69 mL/min/{1.73_m2} (ref >59)
GFR calc non Af Amer: 60 mL/min/{1.73_m2} (ref >59)
Glucose: 88 mg/dL (ref 65–99)
Potassium: 4.7 mmol/L (ref 3.5–5.2)
Sodium: 138 mmol/L (ref 134–144)

## 2018-08-21 NOTE — Telephone Encounter (Signed)
Left message on voicemail with name and callback number Nilesh Stegall RN Navigator Cardiac Imaging [facility] [phone] Office [phone] Cell  

## 2018-08-21 NOTE — Telephone Encounter (Signed)
Pt returned phone call regarding upcoming cardiac imaging study; pt verbalizes understanding of appt date/time, parking situation and where to check in, pre-test NPO status and medications ordered, and verified current allergies; name and call back number provided for further questions should they arise Rockwell Alexandria RN Navigator Cardiac Imaging [facility] [phone] office [phone] cell  Pt denies covid 19 symptoms, verbalized understanding of visitor policy

## 2018-08-22 ENCOUNTER — Ambulatory Visit (HOSPITAL_COMMUNITY)
Admission: RE | Admit: 2018-08-22 | Discharge: 2018-08-22 | Disposition: A | Discharge status: Home / Self Care | Payer: Managed Care, Other (non HMO) | Source: Ambulatory Visit | Attending: Cardiology | Admitting: Cardiology

## 2018-08-22 ENCOUNTER — Ambulatory Visit (HOSPITAL_COMMUNITY): Payer: Managed Care, Other (non HMO)

## 2018-08-22 DIAGNOSIS — I472 Ventricular tachycardia, unspecified: Secondary | ICD-10-CM

## 2018-08-22 DIAGNOSIS — I428 Other cardiomyopathies: Secondary | ICD-10-CM

## 2018-08-22 MED ORDER — NITROGLYCERIN 0.4 MG SL SUBL
0.8000 mg | SUBLINGUAL_TABLET | Freq: Once | SUBLINGUAL | Status: AC
  Administered 2018-08-22: 0.8 mg via SUBLINGUAL

## 2018-08-22 MED ORDER — IOHEXOL 350 MG/ML SOLN
100.0000 mL | Freq: Once | INTRAVENOUS | Status: AC | PRN
  Administered 2018-08-22: 100 mL via INTRAVENOUS

## 2018-08-22 MED ORDER — NITROGLYCERIN 0.4 MG SL SUBL
SUBLINGUAL_TABLET | SUBLINGUAL | Status: AC
  Filled 2018-08-22: qty 2

## 2018-08-22 NOTE — Discharge Instructions (Signed)
What You Need to Know About IV Contrast Material °IV contrast material is most often a fluid that is used with some imaging tests. Contrast material is injected into your body through a vein to help your health care providers see your organs and tissues more clearly. It may be used with: °· X-ray. °· MRI. °· CT. °· Ultrasound. °Contrast material is used when your health care providers need a detailed look at organs, tissues, or blood vessels that may not show up with the standard test. IV contrast may be used for imaging tests that examine: °· Muscles, skin, and fat. °· Breasts. °· Brain. °· Digestive tract. °· Heart. °· Liver. °· Lungs and many other internal organs. °What are the risks of using IV contrast material? °The risks of using IV contrast material include: °· Headache. °· Itching, skin rash, and hives. °· Allergic reactions. °· Nausea and vomiting. °· Wheezing or difficulty breathing. °· Abnormal heart rate. °· Blood pressure changes. °· Throat swelling. °· Kidney damage. °These complications are more likely to occur in people who: °· Have kidney failure. °· Have liver problems. °· Have certain heart problems, including: °? Heart failure. °? Heart attack. °? Heart infection. °? Heart valve problems. °· Abuse alcohol. °· Have allergies or asthma. °· Are dehydrated. °· Have sickle cell anemia or similar problems. °· Have had trouble with IV contrast material in the past. °· Take certain medicines, such as: °? Metformin. °? NSAIDs. °? Beta blockers. °? Interleukin-2. °How do I prepare for my test with IV contrast material? °· Follow instructions from your health care provider about eating or drinking restrictions. °· Ask your health care provider about changing or stopping your regular medicines. This is especially important if you are taking diabetes medicines or blood thinners. °· Tell your health care provider about: °? Any previous illnesses, surgeries, or pre-existing medical conditions. °? Whether you  are pregnant or may be pregnant. °? Whether you are breastfeeding. Most contrast agents are safe for use in breastfeeding women. °· You may have a physical exam to determine any potential risks. °· Ask if you will be given a medicine (sedative) to help you relax during the procedure. If so, plan to have someone take you home after test. °What happens during the test with IV contrast material? ° °· You may be given a sedative to help you relax. °· A needle will be inserted into one of your veins to administer the IV contrast material. °· You may feel warmth or flushing as the material enters your bloodstream. °· You may have a metallic taste in your mouth for a few minutes. °· The needle may cause some discomfort and bruising. °· After the contrast material is in your body, the imaging test will be done. °The procedure may vary among health care providers and hospitals. °What happens after the test with IV contrast material? °· You may be asked to drink water or other fluids to wash (flush) the contrast material out of your body. °· Drink enough fluid to keep your urine pale yellow. °· Do not drive for 24 hours if you received a sedative. °· It is your responsibility to get your test results. Ask your health care provider or the department performing the test when your results will be ready. °Contact a health care provider if: °· You have redness, swelling, or pain near your IV site. °Get help right away if: °· You have an abnormal heart rhythm. °· You have trouble breathing. °· You have: °?   Chest pain. °? Pain in your back, neck, arm, jaw, or stomach. °? Nausea or sweating. °? Hives or a rash. °· You start shaking and cannot stop. °These symptoms may represent a serious problem that is an emergency. Do not wait to see if the symptoms will go away. Get medical help right away. Call your local emergency services (911 in the [place] Do not drive yourself to the hospital. °Summary °· IV contrast may be used for imaging  tests to help your health care providers see your organs and tissues more clearly. °· Tell your health care provider if you are pregnant or may be pregnant. °· During the procedure, you may feel warmth or flushing as the material enters your bloodstream. °· After the procedure, drink enough fluid to keep your urine pale yellow. °This information is not intended to replace advice given to you by your health care provider. Make sure you discuss any questions you have with your health care provider. °Document Released: 03/17/2009 Document Revised: 11/21/2017 Document Reviewed: 12/04/2014 °Elsevier Interactive Patient Education © 2019 [employer] ° ° °Cardiac CT Angiogram ° °A cardiac CT angiogram is a procedure to look at the heart and the area around the heart. It may be done to help find the cause of chest pains or other symptoms of heart disease. During this procedure, a large X-ray machine, called a CT scanner, takes detailed pictures of the heart and the surrounding area after a dye (contrast material) has been injected into blood vessels in the area. The procedure is also sometimes called a coronary CT angiogram, coronary artery scanning, or CTA. °A cardiac CT angiogram allows the health care provider to see how well blood is flowing to and from the heart. The health care provider will be able to see if there are any problems, such as: °· Blockage or narrowing of the coronary arteries in the heart. °· Fluid around the heart. °· Signs of weakness or disease in the muscles, valves, and tissues of the heart. °Tell a health care provider about: °· Any allergies you have. This is especially important if you have had a previous allergic reaction to contrast dye. °· All medicines you are taking, including vitamins, herbs, eye drops, creams, and over-the-counter medicines. °· Any blood disorders you have. °· Any surgeries you have had. °· Any medical conditions you have. °· Whether you are pregnant or may be  pregnant. °· Any anxiety disorders, chronic pain, or other conditions you have that may increase your stress or prevent you from lying still. °What are the risks? °Generally, this is a safe procedure. However, problems may occur, including: °· Bleeding. °· Infection. °· Allergic reactions to medicines or dyes. °· Damage to other structures or organs. °· Kidney damage from the dye or contrast that is used. °· Increased risk of cancer from radiation exposure. This risk is low. Talk with your health care provider about: °? The risks and benefits of testing. °? How you can receive the lowest dose of radiation. °What happens before the procedure? °· Wear comfortable clothing and remove any jewelry, glasses, dentures, and hearing aids. °· Follow instructions from your health care provider about eating and drinking. This may include: °? For 12 hours before the test -- avoid caffeine. This includes tea, coffee, soda, energy drinks, and diet pills. Drink plenty of water or other fluids that do not have caffeine in them. Being well-hydrated can prevent complications. °? For 4-6 hours before the test -- stop eating and drinking. The   contrast dye can cause nausea, but this is less likely if your stomach is empty. °· Ask your health care provider about changing or stopping your regular medicines. This is especially important if you are taking diabetes medicines, blood thinners, or medicines to treat erectile dysfunction. °What happens during the procedure? °· Hair on your chest may need to be removed so that small sticky patches called electrodes can be placed on your chest. These will transmit information that helps to monitor your heart during the test. °· An IV tube will be inserted into one of your veins. °· You might be given a medicine to control your heart rate during the test. This will help to ensure that good images are obtained. °· You will be asked to lie on an exam table. This table will slide in and out of the CT  machine during the procedure. °· Contrast dye will be injected into the IV tube. You might feel warm, or you may get a metallic taste in your mouth. °· You will be given a medicine (nitroglycerin) to relax (dilate) the arteries in your heart. °· The table that you are lying on will move into the CT machine tunnel for the scan. °· The person running the machine will give you instructions while the scans are being done. You may be asked to: °? Keep your arms above your head. °? Hold your breath. °? Stay very still, even if the table is moving. °· When the scanning is complete, you will be moved out of the machine. °· The IV tube will be removed. °The procedure may vary among health care providers and hospitals. °What happens after the procedure? °· You might feel warm, or you may get a metallic taste in your mouth from the contrast dye. °· You may have a headache from the nitroglycerin. °· After the procedure, drink water or other fluids to wash (flush) the contrast material out of your body. °· Contact a health care provider if you have any symptoms of allergy to the contrast. These symptoms include: °? Shortness of breath. °? Rash or hives. °? A racing heartbeat. °· Most people can return to their normal activities right after the procedure. Ask your health care provider what activities are safe for you. °· It is up to you to get the results of your procedure. Ask your health care provider, or the department that is doing the procedure, when your results will be ready. °Summary °· A cardiac CT angiogram is a procedure to look at the heart and the area around the heart. It may be done to help find the cause of chest pains or other symptoms of heart disease. °· During this procedure, a large X-ray machine, called a CT scanner, takes detailed pictures of the heart and the surrounding area after a dye (contrast material) has been injected into blood vessels in the area. °· Ask your health care provider about changing or  stopping your regular medicines before the procedure. This is especially important if you are taking diabetes medicines, blood thinners, or medicines to treat erectile dysfunction. °· After the procedure, drink water or other fluids to wash (flush) the contrast material out of your body. °This information is not intended to replace advice given to you by your health care provider. Make sure you discuss any questions you have with your health care provider. °Document Released: 03/11/2008 Document Revised: 02/16/2016 Document Reviewed: 02/16/2016 °Elsevier Interactive Patient Education © 2019 [employer] ° °

## 2018-08-22 NOTE — Progress Notes (Signed)
Pt tolerated exam without incident.  PIV removed and dressing applied.  Verbal instructions given to patient in regards to IV contrast.  Pt discharged

## 2018-08-30 ENCOUNTER — Telehealth: Payer: Managed Care, Other (non HMO) | Admitting: Internal Medicine

## 2018-09-01 ENCOUNTER — Encounter: Payer: Self-pay | Admitting: Internal Medicine

## 2018-09-01 ENCOUNTER — Telehealth (INDEPENDENT_AMBULATORY_CARE_PROVIDER_SITE_OTHER): Payer: Managed Care, Other (non HMO) | Admitting: Internal Medicine

## 2018-09-01 ENCOUNTER — Other Ambulatory Visit: Payer: Self-pay

## 2018-09-01 VITALS — BP 139/69 | HR 63 | Ht 63.0 in | Wt 150.0 lb

## 2018-09-01 DIAGNOSIS — I428 Other cardiomyopathies: Secondary | ICD-10-CM | POA: Diagnosis not present

## 2018-09-01 DIAGNOSIS — I5022 Chronic systolic (congestive) heart failure: Secondary | ICD-10-CM

## 2018-09-01 DIAGNOSIS — I4729 Other ventricular tachycardia: Secondary | ICD-10-CM

## 2018-09-01 DIAGNOSIS — I472 Ventricular tachycardia: Secondary | ICD-10-CM

## 2018-09-01 NOTE — Progress Notes (Signed)
Electrophysiology TeleHealth Note   Due to national recommendations of social distancing due to COVID 19, an audio/video telehealth visit is felt to be most appropriate for this patient at this time.  See MyChart message from today for the patient's consent to telehealth for [facility].   Date:  09/01/2018   ID:  [name] 29, 1957, MRN [id number]  Location: patient's home  Provider location: [address], [place]  Evaluation Performed: Follow-up visit  PCP:  [name], MD  Cardiologist:   Verlin Fester Electrophysiologist:  SK   Chief Complaint:  Ventricular tachycardia   History of Present Illness:    [name] is a 63 y.o. female who presents via audio/video conferencing for a telehealth visit today.  Since last being seen in our clinic for consideration of an ICD*  the patient reports having undergone CTA demonstrating no obstructive coronary disease and cardiopulmonary stress testing after which she had sustained VT albeit without symptoms  Originally seen 12/19 for consideration of an ICD, her functional status in the context of her nonischemic myopathy was exceptional.  We agreed to try to reintroduce Entresto; this turned out to be not tolerated second time.  She also underwent CPX as above and was found to have no functional limitations  DATE TEST EF   5/15 Myoview  30-35% No Ischemia  6/15 Echo  30-35%   8/16 Echo  50-55%   7/19 Echo   30-35 %   12/19 Echo  30-35%   2/20 CPX  Normal exercise capacity with no clear cardiopulmonary limitation to exercise.  5/20 CTA  Ca Score     The patient denies symptoms of fevers, chills, cough, or new SOB worrisome for COVID 19.    Past Medical History:  Diagnosis Date  . Esophageal reflux   . Osteoporosis   . Palpitations     Past Surgical History:  Procedure Laterality Date  . VAGINAL HYSTERECTOMY      Current Outpatient Medications  Medication Sig Dispense Refill  .  carvedilol (COREG) 3.125 MG tablet Take 3.125 mg by mouth 2 (two) times daily with a meal.    . eplerenone (INSPRA) 25 MG tablet Take 0.5 tablets (12.5 mg total) by mouth daily. 30 tablet 1  . meclizine (ANTIVERT) 12.5 MG tablet Take 1 tablet by mouth 3 (three) times daily as needed for dizziness.    Marland Kitchen olmesartan (BENICAR) 20 MG tablet Take 1 tablet (20 mg total) by mouth daily. 90 tablet 2  . PREMARIN vaginal cream Place 1 Applicatorful vaginally daily as needed.   0   No current facility-administered medications for this visit.     Allergies:   Cefdinir; Entresto [sacubitril-valsartan]; and Aldactone [spironolactone]   Social History:  The patient  reports that she has never smoked. She has never used smokeless tobacco. She reports that she does not drink alcohol or use drugs.   Family History:  [name]   family history includes CAD in an other family member; Diabetes in her maternal uncle; Heart attack (age of onset: 33) in her mother.   ROS:  Please see the history of present illness.   All other systems are personally reviewed and negative.    Exam:    Vital Signs:  BP 139/69   Pulse 63   Ht 5\' 3"  (1.6 m)   Wt 150 lb (68 kg)   BMI 26.57 kg/m     Well appearing, alert and conversant, regular work of  breathing,  good skin color Eyes- anicteric, neuro- grossly intact, skin- no apparent rash or lesions or cyanosis, mouth- oral mucosa is pink   Labs/Other Tests and Data Reviewed:    Recent Labs: 08/21/2018: BUN 20; Creatinine, Ser 1.00; Potassium 4.7; Sodium 138   Wt Readings from Last 3 Encounters:  09/01/18 150 lb (68 kg)  08/10/18 150 lb (68 kg)  04/06/18 154 lb (69.9 kg)     Other studies personally reviewed: Additional studies/ records that were reviewed today include: As above     ASSESSMENT & PLAN:    Ventricualr tachycardia sustained ( > 20 sec)   Nonischemic cardiomyopathy    The patient has sustained ventricular tachycardia in the context of  nonischemic cardiomyopathy.  This would identify her at higher risk of sudden cardiac death not withstanding her excellent showing on the CPX.  Indeed, this would identify her at significantly higher proportional risk of arrhythmic versus heart failure death.  These episodes have been triggered by exercise.  We will increase her beta-blocker.  It is not clear to me the implications in terms of mortality risk of the ventricular tachycardia being well-tolerated.  This is been an ongoing debate for years in the literature.  In the presence of a nonischemic myopathy, ablation is a less attractive option.  The morphology does not suggest verapamil sensitive or RVOT VT.  Hence I have recommended beta-blockers plus implantable defibrillator  Have reviewed the potential benefits and risks of ICD implantation including but not limited to death, perforation of heart or lung, lead dislodgement, infection,  device malfunction and inappropriate shocks.  The patient and family express understanding would like to consider it further     COVID 19 screen The patient denies symptoms of COVID 19 at this time.  The importance of social distancing was discussed today.  Follow-up: phone call in 3 weeks to decide what she would like to do about an ICD     Current medicines are reviewed at length with the patient today.   The patient does not have concerns regarding her medicines.  The following changes were made today:  Increase carvedilol 3.125>>6.25  Labs/ tests ordered today include:   No orders of the defined types were placed in this encounter.    Patient Risk:  after full review of this patients clinical status, I feel that they are at moderate  risk at this time.  Today, I have spent 36  minutes with the patient with telehealth technology discussing the above.  Signed, Sherryl Manges, MD  09/01/2018 10:01 AM     [facility] [address] [place] [address] [phone]  (office) [phone] (fax)

## 2018-09-26 ENCOUNTER — Telehealth: Payer: Managed Care, Other (non HMO) | Admitting: Internal Medicine

## 2018-09-29 ENCOUNTER — Telehealth (INDEPENDENT_AMBULATORY_CARE_PROVIDER_SITE_OTHER): Payer: Managed Care, Other (non HMO) | Admitting: Internal Medicine

## 2018-09-29 ENCOUNTER — Encounter: Payer: Self-pay | Admitting: Internal Medicine

## 2018-09-29 ENCOUNTER — Other Ambulatory Visit: Payer: Self-pay

## 2018-09-29 VITALS — BP 121/69 | HR 61 | Ht 63.0 in | Wt 148.0 lb

## 2018-09-29 DIAGNOSIS — I472 Ventricular tachycardia, unspecified: Secondary | ICD-10-CM

## 2018-09-29 DIAGNOSIS — I428 Other cardiomyopathies: Secondary | ICD-10-CM

## 2018-09-29 DIAGNOSIS — I5022 Chronic systolic (congestive) heart failure: Secondary | ICD-10-CM

## 2018-09-29 NOTE — Progress Notes (Signed)
Electrophysiology TeleHealth Note   Due to national recommendations of social distancing due to COVID 19, an audio/video telehealth visit is felt to be most appropriate for this patient at this time.  See MyChart message from today for the patient's consent to telehealth for [facility].   Date:  09/29/2018   ID:  [name], [name] [birth year], MRN [id number]  Location: patient's home  Provider location: [address] [place]  Evaluation Performed: Follow-up visit  PCP:  Mateo Flow, MD  Cardiologist:   Lesly Dukes Electrophysiologist:  SK   Chief Complaint:  Ventricular tachycardia   History of Present Illness:    [name] is a 63 y.o. female who presents via audio/video conferencing for a telehealth visit today.  Since last being seen in our clinic for consideration of an ICD*  the patient reports having undergone CTA demonstrating no obstructive coronary disease and cardiopulmonary stress testing after which she had sustained VT albeit without symptoms  Originally seen 12/19 for consideration of an ICD, her functional status in the context of her nonischemic myopathy was exceptional.  We agreed to try to reintroduce Entresto; this turned out to be not tolerated second time.  She also underwent CPX as above and was found to have no functional limitations  DATE TEST EF   5/15 Myoview  30-35% No Ischemia  6/15 Echo  30-35%   8/16 Echo  50-55%   7/19 Echo   30-35 %   12/19 Echo  30-35%   2/20 CPX  Normal exercise capacity with no clear cardiopulmonary limitation to exercise.  5/20 CTA  Ca Score = 0   VT was 20 sec long after GXT  ? Adrenergically mediated  She is disinclined to undergo ICD   Not able to clearlt clarify but basically she feels too good  The patient denies symptoms of fevers, chills, cough, or new SOB worrisome for COVID 19.    Past Medical History:  Diagnosis Date  . Esophageal reflux   . Osteoporosis   . Palpitations      Past Surgical History:  Procedure Laterality Date  . VAGINAL HYSTERECTOMY      Current Outpatient Medications  Medication Sig Dispense Refill  . carvedilol (COREG) 3.125 MG tablet Take 3.125 mg by mouth 2 (two) times daily with a meal.     . eplerenone (INSPRA) 25 MG tablet Take 0.5 tablets (12.5 mg total) by mouth daily. 30 tablet 1  . meclizine (ANTIVERT) 12.5 MG tablet Take 1 tablet by mouth 3 (three) times daily as needed for dizziness.    Marland Kitchen olmesartan (BENICAR) 20 MG tablet Take 1 tablet (20 mg total) by mouth daily. 90 tablet 2  . PREMARIN vaginal cream Place 1 Applicatorful vaginally daily as needed.   0   No current facility-administered medications for this visit.     Allergies:   Cefdinir, Entresto [sacubitril-valsartan], and Aldactone [spironolactone]   Social History:  The patient  reports that she has never smoked. She has never used smokeless tobacco. She reports that she does not drink alcohol or use drugs.   Family History:  [name]   family history includes CAD in an other family member; Diabetes in her maternal uncle; Heart attack (age of onset: 73) in her mother.   ROS:  Please see the history of present illness.   All other systems are personally reviewed and negative.    Exam:    Vital Signs:  BP 121/69  Pulse 61   Ht 5\' 3"  (1.6 m)   Wt 148 lb (67.1 kg)   BMI 26.22 kg/m     Well appearing, alert and conversant, regular work of breathing,  good skin color Eyes- anicteric, neuro- grossly intact, skin- no apparent rash or lesions or cyanosis, mouth- oral mucosa is pink   Labs/Other Tests and Data Reviewed:    Recent Labs: 08/21/2018: BUN 20; Creatinine, Ser 1.00; Potassium 4.7; Sodium 138   Wt Readings from Last 3 Encounters:  09/29/18 148 lb (67.1 kg)  09/01/18 150 lb (68 kg)  08/10/18 150 lb (68 kg)     Other studies personally reviewed: Additional studies/ records that were reviewed today include: As above     ASSESSMENT & PLAN:     Ventricualr tachycardia sustained ( > 20 sec)   Nonischemic cardiomyopathy     Lengthy discussion regarding the ICD -- against which she is disinclined    With Adrenergic stimulation  More Betablockers seems a reasonable alternative  Will increase carvedilol as much as tolerated and then consider repeat GXT     More than 50% of 45 min was spent in counseling related to the above    COVID 19 screen The patient denies symptoms of COVID 19 at this time.  The importance of social distancing was discussed today.       Current medicines are reviewed at length with the patient today.   The patient does not have concerns regarding her medicines.  The following changes were made today:  Increase carvedilol 3.125>>6.25  And then uptitrate to max tolerated dose and then do GXT to assess inducibility   Labs/ tests ordered today include:   No orders of the defined types were placed in this encounter.    Patient Risk:  after full review of this patients clinical status, I feel that they are at moderate  risk at this time.  Today, I have spent 33  minutes with the patient with telehealth technology discussing the above.  Signed, Sherryl Manges, MD  09/29/2018 4:39 PM     [facility] [address] [place] [address] [phone] (office) [phone] (fax)

## 2018-10-02 MED ORDER — CARVEDILOL 6.25 MG PO TABS: 6.2500 mg | ORAL_TABLET | Freq: Two times a day (BID) | ORAL | 3 refills | Status: DC

## 2018-10-02 NOTE — Addendum Note (Signed)
Addended by: Dollene Primrose on: 10/02/2018 08:28 AM   Modules accepted: Orders

## 2018-10-04 ENCOUNTER — Telehealth: Payer: Self-pay | Admitting: Internal Medicine

## 2018-10-04 DIAGNOSIS — I472 Ventricular tachycardia, unspecified: Secondary | ICD-10-CM

## 2018-10-04 NOTE — Telephone Encounter (Signed)
Called pt today to assess her concerns. She states for the last couple of days she has felt her "heart jumping up like when you hit a bump in the car." Pt states she does not feel bad when this happens, but believes it has been happening more often in the last couple of days. Her concern is Dr Caryl Comes increased her Carvedilol from 3.125mg >>6.25mg  which she began last Friday. Initially she felt fine with the increase, but has become concerned the medication could be causing more heart palpitations.  I explained to pt the purpose of increasing the BB. We discussed her history of having palpitations and known PVCs and PAC's per holter monitor results. I advised her that I thought it was unlikely the increase in carvedilol triggered increased episodes of PVC/PAC's if that is what she was feeling. I thought her symptoms may be coincidental. She states she has been outside lately which may cause some mild dehydration triggering more events. I encouraged her continue on her carvedilol and make note of her episodes.   She is wondering if a monitor would be of use so she can figure out what is going on. I told her I would forward to Dr Caryl Comes to review and give recommendation on.

## 2018-10-04 NOTE — Telephone Encounter (Signed)
A monitor could ceratinly be helpful   ZIO XT for 2 weeks   Lorren Thx SK

## 2018-10-04 NOTE — Telephone Encounter (Signed)
Pt c/o medication issue:  1. Name of Medication carvedilol (COREG) 6.25 MG tablet  2. How are you currently taking this medication (dosage and times per day)? 6.25 mg daily  3. Are you having a reaction (difficulty breathing--STAT)? no  4. What is your medication issue? Patient feels that something has changed. She feels like maybe her heart is skipping a beat. It doesn't happen continuously. She states it is hard to explain.   She was taking 3.125mg  daily, and at her telemedicine visit on 09/29/18 Dr. Caryl Comes told her to double up on her dosage. She has not changed her eating habits or activity level.  Patient has a phone appointment from 10-11 today and will be unable to answer calls from that time. Otherwise she is free to talk to the nurse

## 2018-10-05 ENCOUNTER — Telehealth: Payer: Self-pay | Admitting: Radiology

## 2018-10-05 NOTE — Telephone Encounter (Signed)
Enrolled patient for a 14 day Zio Telemetry monitor to be mailed. Brief instructions were gone over with patient and she knows to expect the monitor to arrive in 3-4 days 

## 2018-10-05 NOTE — Addendum Note (Signed)
Addended by: Dollene Primrose on: 10/05/2018 11:35 AM   Modules accepted: Orders

## 2018-10-05 NOTE — Telephone Encounter (Signed)
Spoke with pt and she agrees with monitor. She states yesterday she took her medication as ordered and she did not feel any palpitations.   She understands our monitor dept will call her to have this set up. She has no additional questions.

## 2018-10-11 ENCOUNTER — Ambulatory Visit (INDEPENDENT_AMBULATORY_CARE_PROVIDER_SITE_OTHER): Payer: Managed Care, Other (non HMO)

## 2018-10-11 DIAGNOSIS — I472 Ventricular tachycardia, unspecified: Secondary | ICD-10-CM

## 2018-10-31 ENCOUNTER — Other Ambulatory Visit: Payer: Self-pay

## 2018-11-06 ENCOUNTER — Encounter

## 2018-11-15 ENCOUNTER — Telehealth: Payer: Self-pay

## 2018-11-15 NOTE — Telephone Encounter (Addendum)
The pt is no longer covered under [employer].  She now has [employer]. I s/w Chanel at [facility] who provided me the pts info as follows:  ID: [id number] BIN: [id number] GRP: [id number] ([employer] Does not use PCN # per Chanel)  Chanel states that a  PA cannot been done over the phone and has to be done through covermymeds or to complete the PA form that she states she is faxing to the office.  I have stated a name brand Benicar PA through covermymeds. Key: [id number]

## 2018-11-16 DIAGNOSIS — L918 Other hypertrophic disorders of the skin: Secondary | ICD-10-CM | POA: Diagnosis not present

## 2018-11-16 DIAGNOSIS — L821 Other seborrheic keratosis: Secondary | ICD-10-CM | POA: Diagnosis not present

## 2018-11-16 DIAGNOSIS — D225 Melanocytic nevi of trunk: Secondary | ICD-10-CM | POA: Diagnosis not present

## 2018-11-16 DIAGNOSIS — L820 Inflamed seborrheic keratosis: Secondary | ICD-10-CM | POA: Diagnosis not present

## 2018-11-16 DIAGNOSIS — L814 Other melanin hyperpigmentation: Secondary | ICD-10-CM | POA: Diagnosis not present

## 2018-12-07 ENCOUNTER — Ambulatory Visit (HOSPITAL_COMMUNITY): Payer: BC Managed Care – PPO | Attending: Cardiology

## 2018-12-07 ENCOUNTER — Encounter: Payer: Self-pay | Admitting: Cardiology

## 2018-12-07 ENCOUNTER — Ambulatory Visit: Payer: BC Managed Care – PPO | Admitting: Cardiology

## 2018-12-07 ENCOUNTER — Other Ambulatory Visit: Payer: Self-pay

## 2018-12-07 VITALS — BP 128/80 | HR 61 | Ht 64.0 in | Wt 147.0 lb

## 2018-12-07 DIAGNOSIS — I420 Dilated cardiomyopathy: Secondary | ICD-10-CM

## 2018-12-07 DIAGNOSIS — I4729 Other ventricular tachycardia: Secondary | ICD-10-CM

## 2018-12-07 DIAGNOSIS — I5023 Acute on chronic systolic (congestive) heart failure: Secondary | ICD-10-CM

## 2018-12-07 DIAGNOSIS — I472 Ventricular tachycardia: Secondary | ICD-10-CM | POA: Diagnosis not present

## 2018-12-07 DIAGNOSIS — I10 Essential (primary) hypertension: Secondary | ICD-10-CM | POA: Diagnosis not present

## 2018-12-07 LAB — ECHOCARDIOGRAM COMPLETE
Height: 64 in
Weight: 2352 oz

## 2018-12-07 NOTE — Patient Instructions (Addendum)
Medication Instructions:   Your physician recommends that you continue on your current medications as directed. Please refer to the Current Medication list given to you today.  If you need a refill on your cardiac medications before your next appointment, please call your pharmacy.     Lab work:  TODAY-CMET, CBC W DIFF, TSH, PRO-BNP, AND LIPIDS  If you have labs (blood work) drawn today and your tests are completely normal, you will receive your results only by: Marland Kitchen MyChart Message (if you have MyChart) OR . A paper copy in the mail If you have any lab test that is abnormal or we need to change your treatment, we will call you to review the results.    Testing/Procedures:  Your physician has requested that you have an echocardiogram. Echocardiography is a painless test that uses sound waves to create images of your heart. It provides your doctor with information about the size and shape of your heart and how well your heart's chambers and valves are working. This procedure takes approximately one hour. There are no restrictions for this procedure. YOUR ECHO WILL BE DONE TODAY AT 2:00 PM IN ECHO #4 WITH TAMMY ECHO TECH--PLEASE ARRIVE 15 MINS PRIOR TO THIS APPOINTMENT.    Follow-Up: At [facility], you and your health needs are our priority.  As part of our continuing mission to provide you with exceptional heart care, we have created designated Provider Care Teams.  These Care Teams include your primary Cardiologist (physician) and Advanced Practice Providers (APPs -  Physician Assistants and Nurse Practitioners) who all work together to provide you with the care you need, when you need it. You will need a follow up appointment in 6 months Dr. Meda Coffee.  Please call our office 2 months in advance to schedule this appointment.  You may see Ena Dawley, MD or one of the following Advanced Practice Providers on your designated Care Team:   [place], PA-C Melina Copa,  PA-C . Ermalinda Barrios, PA-C

## 2018-12-07 NOTE — Progress Notes (Signed)
Cardiology Office Note:    Date:  12/07/2018   ID:  Marceil, Welp 1956/03/29, MRN [id number]  PCP:  Mateo Flow, MD  Cardiologist:  Ena Dawley, MD  Electrophysiologist:  None   Referring MD: Mateo Flow, MD   Reason for visit: 4 months follow up   History of Present Illness:    ALANTE WEIMANN is a 63 y.o. female with a hx of non-ischemic cardiomyopathy (negative stress test), LVEF 30-35% improved to 50-55% in 2016, at some point then back to 30-35%. She is variable intolerant to multiple medication including Entresto, ACE inhibitors and ARBs.   She has significant family history of coronary artery disease her mother died of massive myocardial infarction at age of 66, there are multiple other family members with CAD. Patient states that her lipids are followed by her primary care physician and are at goal.  The patient was seen by Dr. Caryl Comes in December of last year for consideration of ICD, he restarted Entresto but she was only able to tolerate it for about a week and then she quit.  He ordered a cardiopulmonary test, VO2 max 21. Dr Caryl Comes stated: looks like she is stage 1 and with NICM would not qualify for an ICD.   Exercise testing with gas exchange demonstrates normal functional capacity when compared to matched sedentary norms. There is no cardiopulmonary limitation. However, there was non-diagnostic ST- depression in lateral leads at peak exercise, with NSVT of approximately 15 consecutive seconds into passive recovery.   She had a lengthy discussion with Dr Caryl Comes again in June 2020 -  regarding the ICD -- against which she is disinclined    With Adrenergic stimulation  More Betablockers seems a reasonable alternative, he increased her carvedilol to 6.25 mg po BID, and eplerenone to 25 mg po daily.  12/07/2018 - Today the patient states that she has been doing well, she remains very active and completely asymptomatic, she tolerates her meds and is compliant. She  denies palpitations or dizziness.    Past Medical History:  Diagnosis Date  . Esophageal reflux   . Osteoporosis   . Palpitations     Past Surgical History:  Procedure Laterality Date  . VAGINAL HYSTERECTOMY      Current Medications: Current Meds  Medication Sig  . carvedilol (COREG) 6.25 MG tablet Take 1 tablet (6.25 mg total) by mouth 2 (two) times daily.  Marland Kitchen eplerenone (INSPRA) 25 MG tablet Take 25 mg by mouth daily.  . meclizine (ANTIVERT) 12.5 MG tablet Take 1 tablet by mouth 3 (three) times daily as needed for dizziness.  Marland Kitchen olmesartan (BENICAR) 20 MG tablet Take 1 tablet (20 mg total) by mouth daily.  Marland Kitchen PREMARIN vaginal cream Place 1 Applicatorful vaginally daily as needed.      Allergies:   Cefdinir, Entresto [sacubitril-valsartan], and Aldactone [spironolactone]   Social History   Socioeconomic History  . Marital status: Married    Spouse name: Not on file  . Number of children: Not on file  . Years of education: Not on file  . Highest education level: Not on file  Occupational History  . Not on file  Social Needs  . Financial resource strain: Not on file  . Food insecurity    Worry: Not on file    Inability: Not on file  . Transportation needs    Medical: Not on file    Non-medical: Not on file  Tobacco Use  . Smoking status: Never Smoker  .  Smokeless tobacco: Never Used  Substance and Sexual Activity  . Alcohol use: No  . Drug use: No  . Sexual activity: Not on file  Lifestyle  . Physical activity    Days per week: Not on file    Minutes per session: Not on file  . Stress: Not on file  Relationships  . Social Herbalist on phone: Not on file    Gets together: Not on file    Attends religious service: Not on file    Active member of club or organization: Not on file    Attends meetings of clubs or organizations: Not on file    Relationship status: Not on file  Other Topics Concern  . Not on file  Social History Narrative  . Not on  file     Family History: [name] family history includes CAD in an other family member; Diabetes in her maternal uncle; Heart attack (age of onset: 59) in her mother. There is no history of Thyroid disease or Stroke.  ROS:   Please see the history of present illness.    All other systems reviewed and are negative.  EKGs/Labs/Other Studies Reviewed:    The following studies were reviewed today:  EKG:  EKG is ordered today.  The ekg ordered today demonstrates SB, normal ECG, unchanged from prior  Recent Labs: 08/21/2018: BUN 20; Creatinine, Ser 1.00; Potassium 4.7; Sodium 138  Recent Lipid Panel    Component Value Date/Time   CHOL 182 12/28/2016 0814   TRIG 78 12/28/2016 0814   HDL 64 12/28/2016 0814   CHOLHDL 2.8 12/28/2016 0814   CHOLHDL 2.6 12/24/2015 0845   VLDL 16 12/24/2015 0845   LDLCALC 102 (H) 12/28/2016 0814    Physical Exam:    VS:  BP 128/80   Pulse 61   Ht '5\' 4"'$  (1.626 m)   Wt 147 lb (66.7 kg)   SpO2 99%   BMI 25.23 kg/m     Wt Readings from Last 3 Encounters:  12/07/18 147 lb (66.7 kg)  09/29/18 148 lb (67.1 kg)  09/01/18 150 lb (68 kg)     GEN:  Well nourished, well developed in no acute distress HEENT: Normal NECK: No JVD; No carotid bruits LYMPHATICS: No lymphadenopathy CARDIAC: RRR, no murmurs, rubs, gallops RESPIRATORY:  Clear to auscultation without rales, wheezing or rhonchi  ABDOMEN: Soft, non-tender, non-distended MUSCULOSKELETAL:  No edema; No deformity  SKIN: Warm and dry NEUROLOGIC:  Alert and oriented x 3 PSYCHIATRIC:  Normal affect    ASSESSMENT:    1. Nonischemic congestive cardiomyopathy ([place])   2. Essential hypertension   3. Acute on chronic systolic CHF (congestive heart failure) (HCC)    PLAN:    In order of problems listed above:  1. Continue current regimen, she is functional class NYHA I, I will repeat echocardiogram to re-evaluate LVEF, she is not completely opposed to ICD implantation just not ready yet. Zio  patch monitor showed  VT nonsustained x 3; longest 9 beats accelerating from 104 t 140.  2. Hypertension is controlled  3. She is euvolemic.   Medication Adjustments/Labs and Tests Ordered: Current medicines are reviewed at length with the patient today.  Concerns regarding medicines are outlined above.  Orders Placed This Encounter  Procedures  . Comp Met (CMET)  . CBC w/Diff  . TSH  . Lipid Profile  . Pro b natriuretic peptide  . EKG 12-Lead  . ECHOCARDIOGRAM COMPLETE   No orders of the  defined types were placed in this encounter.  Patient Instructions  Medication Instructions:   Your physician recommends that you continue on your current medications as directed. Please refer to the Current Medication list given to you today.  If you need a refill on your cardiac medications before your next appointment, please call your pharmacy.     Lab work:  TODAY-CMET, CBC W DIFF, TSH, PRO-BNP, AND LIPIDS  If you have labs (blood work) drawn today and your tests are completely normal, you will receive your results only by: Marland Kitchen MyChart Message (if you have MyChart) OR . A paper copy in the mail If you have any lab test that is abnormal or we need to change your treatment, we will call you to review the results.    Testing/Procedures:  Your physician has requested that you have an echocardiogram. Echocardiography is a painless test that uses sound waves to create images of your heart. It provides your doctor with information about the size and shape of your heart and how well your heart's chambers and valves are working. This procedure takes approximately one hour. There are no restrictions for this procedure. YOUR ECHO WILL BE DONE TODAY AT 2:00 PM IN ECHO #4 WITH TAMMY ECHO TECH--PLEASE ARRIVE 15 MINS PRIOR TO THIS APPOINTMENT.    Follow-Up: At [facility], you and your health needs are our priority.  As part of our continuing mission to provide you with exceptional heart care,  we have created designated Provider Care Teams.  These Care Teams include your primary Cardiologist (physician) and Advanced Practice Providers (APPs -  Physician Assistants and Nurse Practitioners) who all work together to provide you with the care you need, when you need it. You will need a follow up appointment in 6 months Dr. Meda Coffee.  Please call our office 2 months in advance to schedule this appointment.  You may see Ena Dawley, MD or one of the following Advanced Practice Providers on your designated Care Team:   [place], PA-C Melina Copa, PA-C . Ermalinda Barrios, PA-C        Signed, Ena Dawley, MD  12/07/2018 12:58 PM    [place]

## 2018-12-08 LAB — COMPREHENSIVE METABOLIC PANEL
ALT: 9 IU/L (ref 0–32)
AST: 16 IU/L (ref 0–40)
Albumin/Globulin Ratio: 1.7 (ref 1.2–2.2)
Albumin: 4.5 g/dL (ref 3.8–4.8)
Alkaline Phosphatase: 56 IU/L (ref 39–117)
BUN/Creatinine Ratio: 22 (ref 12–28)
BUN: 22 mg/dL (ref 8–27)
Bilirubin Total: 1.3 mg/dL — ABNORMAL HIGH (ref 0.0–1.2)
CO2: 22 mmol/L (ref 20–29)
Calcium: 9.4 mg/dL (ref 8.7–10.3)
Chloride: 105 mmol/L (ref 96–106)
Creatinine, Ser: 1 mg/dL (ref 0.57–1.00)
GFR calc Af Amer: 69 mL/min/{1.73_m2} (ref >59)
GFR calc non Af Amer: 60 mL/min/{1.73_m2} (ref >59)
Globulin, Total: 2.7 g/dL (ref 1.5–4.5)
Glucose: 100 mg/dL — ABNORMAL HIGH (ref 65–99)
Potassium: 5 mmol/L (ref 3.5–5.2)
Sodium: 143 mmol/L (ref 134–144)
Total Protein: 7.2 g/dL (ref 6.0–8.5)

## 2018-12-08 LAB — LIPID PANEL
Chol/HDL Ratio: 3 ratio (ref 0.0–4.4)
Cholesterol, Total: 193 mg/dL (ref 100–199)
HDL: 65 mg/dL (ref >39)
LDL Calculated: 110 mg/dL — ABNORMAL HIGH (ref 0–99)
Triglycerides: 88 mg/dL (ref 0–149)
VLDL Cholesterol Cal: 18 mg/dL (ref 5–40)

## 2018-12-08 LAB — CBC WITH DIFFERENTIAL/PLATELET
Basophils Absolute: 0 10*3/uL (ref 0.0–0.2)
Basos: 0 %
EOS (ABSOLUTE): 0.1 10*3/uL (ref 0.0–0.4)
Eos: 1 %
Hematocrit: 36 % (ref 34.0–46.6)
Hemoglobin: 12.2 g/dL (ref 11.1–15.9)
Immature Grans (Abs): 0 10*3/uL (ref 0.0–0.1)
Immature Granulocytes: 0 %
Lymphocytes Absolute: 1.9 10*3/uL (ref 0.7–3.1)
Lymphs: 31 %
MCH: 30.9 pg (ref 26.6–33.0)
MCHC: 33.9 g/dL (ref 31.5–35.7)
MCV: 91 fL (ref 79–97)
Monocytes Absolute: 0.4 10*3/uL (ref 0.1–0.9)
Monocytes: 6 %
Neutrophils Absolute: 3.9 10*3/uL (ref 1.4–7.0)
Neutrophils: 62 %
Platelets: 272 10*3/uL (ref 150–450)
RBC: 3.95 x10E6/uL (ref 3.77–5.28)
RDW: 12.3 % (ref 11.7–15.4)
WBC: 6.2 10*3/uL (ref 3.4–10.8)

## 2018-12-08 LAB — PRO B NATRIURETIC PEPTIDE: NT-Pro BNP: 240 pg/mL (ref 0–287)

## 2018-12-08 LAB — TSH: TSH: 1.78 u[IU]/mL (ref 0.450–4.500)

## 2019-01-19 NOTE — Telephone Encounter (Signed)
Message received from [facility]:   [name] Key: [id number] Outcome  Approved on August 7  Effective from 11/15/2018 through 11/13/2021.

## 2019-03-14 ENCOUNTER — Other Ambulatory Visit: Payer: Self-pay | Admitting: Cardiology

## 2019-03-14 NOTE — Telephone Encounter (Signed)
°*  STAT* If patient is at the pharmacy, call can be transferred to refill team.   1. Which medications need to be refilled? (please list name of each medication and dose if known) Eplerenone   2. Which pharmacy/location (including street and city if local pharmacy) is medication to be sent to? [place]  3. Do they need a 30 day or 90 day supply?30 and refills

## 2019-04-03 ENCOUNTER — Telehealth: Payer: Self-pay | Admitting: Radiology

## 2019-04-03 DIAGNOSIS — I472 Ventricular tachycardia, unspecified: Secondary | ICD-10-CM

## 2019-04-03 NOTE — Telephone Encounter (Signed)
Exercise treadmill test was ordered back in June 2020 when treadmill room was shut down due to Covid. Please advise if patient still needs test scheduled.  If no longer needed please cancel the order   

## 2019-04-04 NOTE — Telephone Encounter (Signed)
Pt underwent a Zio monitor in the same time period her GXT was ordered. I will forward to MD to review if he would still like pt to have the GXT performed.

## 2019-04-07 NOTE — Telephone Encounter (Signed)
The GXT was to look for exercise induced VT on maximally tolerated BB  If she is symptom free, we can hold off on that

## 2019-04-09 NOTE — Telephone Encounter (Signed)
I would still proceed with GXT, plesae arrange, thank you

## 2019-04-10 NOTE — Addendum Note (Signed)
Addended by: Dollene Primrose on: 04/10/2019 10:05 AM   Modules accepted: Orders

## 2019-04-10 NOTE — Telephone Encounter (Signed)
Dr. Meda Coffee would like for pt to be scheduled for exercise stress.

## 2019-04-18 NOTE — Telephone Encounter (Signed)
Pts COVID test is scheduled for 3/19 and her GXT is scheduled for 07/03/19 at 0945. Pt made aware of appt dates and times by [facility] scheduling.

## 2019-05-01 ENCOUNTER — Other Ambulatory Visit (HOSPITAL_COMMUNITY): Payer: BC Managed Care – PPO

## 2019-05-04 ENCOUNTER — Inpatient Hospital Stay (HOSPITAL_COMMUNITY): Admission: RE | Admit: 2019-05-04 | Payer: BC Managed Care – PPO | Source: Ambulatory Visit

## 2019-05-19 ENCOUNTER — Other Ambulatory Visit: Payer: Self-pay | Admitting: Internal Medicine

## 2019-05-21 ENCOUNTER — Other Ambulatory Visit: Payer: Self-pay | Admitting: Cardiology

## 2019-05-21 MED ORDER — OLMESARTAN MEDOXOMIL 20 MG PO TABS: 20.0000 mg | ORAL_TABLET | Freq: Every day | ORAL | 1 refills | Status: DC

## 2019-05-21 NOTE — Telephone Encounter (Signed)
Pt's medication was sent to pt's pharmacy as requested. Confirmation received.  °

## 2019-05-25 ENCOUNTER — Telehealth: Payer: Self-pay | Admitting: Cardiology

## 2019-05-25 NOTE — Telephone Encounter (Signed)
We are recommending the COVID-19 vaccine to all of our patients. Cardiac medications (including blood thinners) should not deter anyone from being vaccinated and there is no need to hold any of those medications prior to vaccine administration.     Currently, there is a hotline to call (active 04/20/19) to schedule vaccination appointments as no walk-ins will be accepted.   Number: [phone].    If an appointment is not available please go to [email or web address] to sign up for notification when additional vaccine appointments are available.   If you have further questions or concerns about the vaccine process, please visit [email or web address] or contact your primary care physician.  I have informed patient of the instructions. 

## 2019-06-18 ENCOUNTER — Telehealth: Payer: Self-pay | Admitting: Cardiology

## 2019-06-18 NOTE — Telephone Encounter (Signed)
I spoke with patient. She was unable to tolerate generic Olmesartan and has been getting name brand Benicar for the last few years. A letter was written in 2018 supporting the need for this.   Her insurance changed to Medicare in January of this year and a new letter is needed indicating need for name brand.  It can be sent to [facility] at outlined below.

## 2019-06-18 NOTE — Telephone Encounter (Signed)
Patient was contacted by [facility] in regards to her rx for olmesartan (BENICAR) 20 MG tablet.  The patient needs to have Dr. Delton See write a letter so that she can be covered to take name brand of this medicine. She has tried the generic versions of the drugs and they do not work.  She may need to go through [facility] to get more of the medication at once.   [facility]  [employer] [address] [address] [place], [place] [address] Phone: [phone] or [phone]  Fax: [phone]  [address] [facility]

## 2019-06-18 NOTE — Telephone Encounter (Signed)
To whom it may concern,  Ms. [name] is currently under my care for nonischemic cardiomyopathy.  She has been experiencing several intolerances and side effects from multiple medications including generic olmesartan's that gave her palpitations.  She was on the other hand able to tolerate Benicar, if at all possible I would strongly suggest that her insurance company covers cost of Benicar I did needed for treatment of her impaired LVEF and she tolerated well.  Please call us with any questions.  Tobias Alexander, MD

## 2019-06-18 NOTE — Telephone Encounter (Signed)
Faxed via Epic function to: [employer] Fax: [phone]

## 2019-06-18 NOTE — Telephone Encounter (Signed)
She had palpitations and it did not control her BP

## 2019-06-18 NOTE — Telephone Encounter (Signed)
What were her side effects with olmesartan?

## 2019-06-21 ENCOUNTER — Telehealth: Payer: Self-pay

## 2019-06-21 NOTE — Telephone Encounter (Signed)
**Note De-Identified Torrence Branagan Obfuscation** Letter received from [facility] stating that they approved coverage of the pts Benicar. Approval is good until 06/17/2020. Case ID: [id number]  [facility] is aware of this approval.

## 2019-06-29 ENCOUNTER — Other Ambulatory Visit (HOSPITAL_COMMUNITY): Payer: BC Managed Care – PPO

## 2019-07-03 ENCOUNTER — Inpatient Hospital Stay (HOSPITAL_COMMUNITY): Admission: RE | Admit: 2019-07-03 | Payer: BC Managed Care – PPO | Source: Ambulatory Visit

## 2019-07-05 ENCOUNTER — Telehealth (HOSPITAL_COMMUNITY): Payer: Self-pay

## 2019-07-05 NOTE — Telephone Encounter (Signed)
Encounter complete. 

## 2019-07-06 ENCOUNTER — Other Ambulatory Visit (HOSPITAL_COMMUNITY)
Admission: RE | Admit: 2019-07-06 | Discharge: 2019-07-06 | Disposition: A | Discharge status: Home / Self Care | Payer: Medicare Other | Source: Ambulatory Visit | Attending: Internal Medicine | Admitting: Internal Medicine

## 2019-07-06 DIAGNOSIS — Z20822 Contact with and (suspected) exposure to covid-19: Secondary | ICD-10-CM | POA: Insufficient documentation

## 2019-07-06 DIAGNOSIS — Z01812 Encounter for preprocedural laboratory examination: Secondary | ICD-10-CM | POA: Insufficient documentation

## 2019-07-06 LAB — SARS CORONAVIRUS 2 (TAT 6-24 HRS): SARS Coronavirus 2: NEGATIVE

## 2019-07-10 ENCOUNTER — Encounter (HOSPITAL_COMMUNITY): Payer: Self-pay | Admitting: *Deleted

## 2019-07-10 ENCOUNTER — Ambulatory Visit (HOSPITAL_COMMUNITY)
Admission: RE | Admit: 2019-07-10 | Discharge: 2019-07-10 | Disposition: A | Discharge status: Home / Self Care | Payer: Medicare Other | Source: Ambulatory Visit | Attending: Cardiovascular Disease | Admitting: Cardiovascular Disease

## 2019-07-10 ENCOUNTER — Other Ambulatory Visit: Payer: Self-pay

## 2019-07-10 DIAGNOSIS — I472 Ventricular tachycardia, unspecified: Secondary | ICD-10-CM

## 2019-07-10 LAB — EXERCISE TOLERANCE TEST
Estimated workload: 8.8 METS
Exercise duration (min): 7 min
Exercise duration (sec): 11 s
MPHR: 155 {beats}/min
Peak HR: 162 {beats}/min
Percent HR: 104 %
Rest HR: 63 {beats}/min

## 2019-07-10 NOTE — Progress Notes (Unsigned)
Abnormal ETT was reviewed by Dr. Royann Shivers. Patient was given the ok to be discharged.

## 2019-07-11 ENCOUNTER — Telehealth: Payer: Self-pay | Admitting: *Deleted

## 2019-07-11 NOTE — Telephone Encounter (Signed)
See below message: Marinus Maw, MD  Orbie Hurst, MD  Aris Lot, I am DOD today. This patient's stress test was positive but I would not characterized as high risk. I suspect she might benefit from a coronary CT or perfusion stress test but I will defer to your judgement. GT

## 2019-07-12 NOTE — Telephone Encounter (Signed)
Will route this to Dr. Delton See and Dr. Graciela Husbands, for Dr. Graciela Husbands originally ordered the GXT on the pt, and Dr. Delton See sent him the result finding on 3/31.

## 2019-07-18 NOTE — Telephone Encounter (Signed)
M  good am  could you call this lady and ask her if she took her carvedilol the day of the GXT Thanks SK

## 2019-07-18 NOTE — Telephone Encounter (Signed)
Dr. Delton See and Dr. Graciela Husbands, were you able to get together to discuss this pts plan of care, based on these results.  Below is the reason the GXT was ordered on this pt.   Duke Salvia, MD to Oretha Milch, RN . Lars Masson, MD     4:57 PM Note   The GXT was to look for exercise induced VT on maximally tolerated BB  If she is symptom free, we can hold off on that      Please advise. Thank you!

## 2019-07-20 NOTE — Telephone Encounter (Signed)
Spoke with pt and verified she did NOT take her Cavedilol prior to her GXT as she was advised to hold for 24 hours prior to testing.

## 2019-07-21 NOTE — Telephone Encounter (Signed)
Oooops  These GXTs for VT suppression assessment are normally done on therapy  We should repeat the study ( without charge) as we did the wrong study  Thanks SK

## 2019-07-26 NOTE — Telephone Encounter (Signed)
Spoke with pt and advised per Dr Graciela Husbands pt should have GXT repeated while on beta blocker.  Test will be repeated at no cost to pt.  Pt very apprehensive about retesting d/t having to be Covid tested again.  Pt states she is scheduled to see Dr Delton See 07/27/2019 and is agreeable to retesting if it is necessary.  Pt will see Dr Delton See tomorrow and move forward with testing if that is what is recommended.  Pt will also be looking at dates for possible scheduling.  Pt verbalizes understanding and agrees with current plan.

## 2019-07-26 NOTE — Telephone Encounter (Signed)
Left message for pt to contact RN at [phone]. 

## 2019-07-26 NOTE — Telephone Encounter (Signed)
Patient returning Marsha's call.  

## 2019-07-27 ENCOUNTER — Ambulatory Visit (INDEPENDENT_AMBULATORY_CARE_PROVIDER_SITE_OTHER): Payer: Medicare Other | Admitting: Cardiology

## 2019-07-27 ENCOUNTER — Other Ambulatory Visit: Payer: Self-pay

## 2019-07-27 ENCOUNTER — Encounter: Payer: Self-pay | Admitting: Cardiology

## 2019-07-27 VITALS — BP 122/72 | HR 55 | Ht 63.0 in | Wt 155.0 lb

## 2019-07-27 DIAGNOSIS — I472 Ventricular tachycardia, unspecified: Secondary | ICD-10-CM

## 2019-07-27 DIAGNOSIS — I420 Dilated cardiomyopathy: Secondary | ICD-10-CM

## 2019-07-27 DIAGNOSIS — I5022 Chronic systolic (congestive) heart failure: Secondary | ICD-10-CM | POA: Diagnosis not present

## 2019-07-27 DIAGNOSIS — I10 Essential (primary) hypertension: Secondary | ICD-10-CM

## 2019-07-27 NOTE — Progress Notes (Signed)
Cardiology Office Note:    Date:  07/27/2019   ID:  [name], Bridget [birth year], MRN [id number]  PCP:  [name], MD  Cardiologist:  Tobias Alexander, MD  Electrophysiologist:  None   Referring MD: [name], MD   Reason for visit: 6 months follow up   History of Present Illness:    [name] is a 64 y.o. female with a hx of non-ischemic cardiomyopathy (negative stress test), LVEF 30-35% improved to 50-55% in 2016, at some point then back to 30-35%, now back to 55-60% in August 2020.  She has significant family history of coronary artery disease her mother died of massive myocardial infarction at age of 19, there are multiple other family members with CAD. Patient states that her lipids are followed by her primary care physician and are at goal.  Exercise testing with gas exchange demonstrates normal functional capacity when compared to matched sedentary norms. There is no cardiopulmonary limitation. However, there was non-diagnostic ST- depression in lateral leads at peak exercise, with NSVT of approximately 15 consecutive seconds into passive recovery.   She had a lengthy discussion with Dr Graciela Husbands again in June 2020 -  regarding the ICD -- against which she is disinclined    With Adrenergic stimulation  More [place]abl[place]ers seems a reasonable alternative, he increased her carvedilol to 6.25 mg po BID, and eplerenone to 25 mg po daily.  07/27/2019 -the patient has been doing great, her palpitations and dizziness have improved significantly since she was started on carvedilol, she had a treadmill test to see if she has exercise-induced VT's while on carvedilol however she was instructed not to take it that day.  She did have runs of nonsustained VT's.  She denies any chest pain shortness of breath no lower extremity edema orthopnea proximal nocturnal dyspnea.  She tolerates all of her medications very well.  Past Medical History:  Diagnosis Date  . Esophageal reflux   .  Osteoporosis   . Palpitations     Past Surgical History:  Procedure Laterality Date  . VAGINAL HYSTERECTOMY     Current Medications: Current Meds  Medication Sig  . carvedilol (COREG) 6.25 MG tablet Take 1 tablet (6.25 mg total) by mouth 2 (two) times daily.  Marland Kitchen eplerenone (INSPRA) 25 MG tablet TAKE 1 TABLET BY MOUTH EVERY DAY  . estradiol (ESTRACE) 0.1 MG/GM vaginal cream Place 1 Applicatorful vaginally at bedtime.  . meclizine (ANTIVERT) 12.5 MG tablet Take 1 tablet by mouth 3 (three) times daily as needed for dizziness.  Marland Kitchen olmesartan (BENICAR) 20 MG tablet Take 1 tablet (20 mg total) by mouth daily.    Allergies:   Cefdinir, Entresto [sacubitril-valsartan], and Aldactone [spironolactone]   Social History   Socioeconomic History  . Marital status: Married    Spouse name: Not on file  . Number of children: Not on file  . Years of education: Not on file  . Highest education level: Not on file  Occupational History  . Not on file  Tobacco Use  . Smoking status: Never Smoker  . Smokeless tobacco: Never Used  Substance and Sexual Activity  . Alcohol use: No  . Drug use: No  . Sexual activity: Not on file  Other Topics Concern  . Not on file  Social History Narrative  . Not on file   Social Determinants of Health   Financial Resource Strain:   . Difficulty of Paying Living Expenses:   Food Insecurity:   . Worried About  Running Out of Food in the Last Year:   . [place] in the Last Year:   Transportation Needs:   . Lack of Transportation (Medical):   Marland Kitchen Lack of Transportation (Non-Medical):   Physical Activity:   . Days of Exercise per Week:   . Minutes of Exercise per Session:   Stress:   . Feeling of Stress :   Social Connections:   . Frequency of Communication with Friends and Family:   . Frequency of Social Gatherings with Friends and Family:   . Attends Religious Services:   . Active Member of Clubs or Organizations:   . Attends Archivist  Meetings:   Marland Kitchen Marital Status:     Family History: [name] family history includes CAD in an other family member; Diabetes in her maternal uncle; Heart attack (age of onset: 60) in her mother. There is no history of Thyroid disease or Stroke.  ROS:   Please see the history of present illness.    All other systems reviewed and are negative.  EKGs/Labs/Other Studies Reviewed:    The following studies were reviewed today:  EKG:  EKG is ordered today.  The ekg ordered today demonstrates SB, normal ECG, unchanged from prior.  This was personally reviewed.  Recent Labs: 12/07/2018: ALT 9; BUN 22; Creatinine, Ser 1.00; Hemoglobin 12.2; NT-Pro BNP 240; Platelets 272; Potassium 5.0; Sodium 143; TSH 1.780  Recent Lipid Panel    Component Value Date/Time   CHOL 193 12/07/2018 1259   TRIG 88 12/07/2018 1259   HDL 65 12/07/2018 1259   CHOLHDL 3.0 12/07/2018 1259   CHOLHDL 2.6 12/24/2015 0845   VLDL 16 12/24/2015 0845   LDLCALC 110 (H) 12/07/2018 1259   Physical Exam:    VS:  BP 122/72   Pulse (!) 55   Ht 5\' 3"  (1.6 m)   Wt 155 lb (70.3 kg)   SpO2 99%   BMI 27.46 kg/m     Wt Readings from Last 3 Encounters:  07/27/19 155 lb (70.3 kg)  12/07/18 147 lb (66.7 kg)  09/29/18 148 lb (67.1 kg)     GEN:  Well nourished, well developed in no acute distress HEENT: Normal NECK: No JVD; No carotid bruits LYMPHATICS: No lymphadenopathy CARDIAC: RRR, no murmurs, rubs, gallops RESPIRATORY:  Clear to auscultation without rales, wheezing or rhonchi  ABDOMEN: Soft, non-tender, non-distended MUSCULOSKELETAL:  No edema; No deformity  SKIN: Warm and dry NEUROLOGIC:  Alert and oriented x 3 PSYCHIATRIC:  Normal affect   TTE: 11/2018  1. The average left ventricular global longitudinal strain is -17.9 %.  2. The left ventricle has normal systolic function, with an ejection  fraction of 55-60%. The cavity size was normal. Left ventricular diastolic  parameters were normal.  3. The right  ventricle has normal systolic function. The cavity was  normal. There is no increase in right ventricular wall thickness. Right  ventricular systolic pressure is normal with an estimated pressure of 23.2  mmHg.  4. The aortic valve is tricuspid. Mild sclerosis of the aortic valve.  Aortic valve regurgitation is trivial by color flow Doppler.  5. The aorta is normal unless otherwise noted.     ASSESSMENT:    1. Chronic systolic CHF (congestive heart failure), NYHA class 2 ([place])   2. Essential hypertension   3. Nonischemic congestive cardiomyopathy ([place])   4. Ventricular tachycardia ([place])    PLAN:    In order of problems listed above:  1.  Nonischemic  cardiomyopathy  -she is on excellent regimen with ARB, beta-blocker and aldosterone antagonist with improvement of LVEF to 55-60%.she is functional class NYHA I.   2.  Exercise induced ventricular tachycardia's -symptoms significantly improved on carvedilol, we will repeat stress test at no cost to the patient.  3. Hypertension is controlled  Medication Adjustments/Labs and Tests Ordered: Current medicines are reviewed at length with the patient today.  Concerns regarding medicines are outlined above.  Orders Placed This Encounter  Procedures  . Exercise Tolerance Test  . EKG 12-Lead   No orders of the defined types were placed in this encounter.  Patient Instructions  Medication Instructions:  Your physician recommends that you continue on your current medications as directed. Please refer to the Current Medication list given to you today.  *If you need a refill on your cardiac medications before your next appointment, please call your pharmacy*    Testing/Procedures: Your physician has requested that you have an exercise tolerance test. For further information please visit [email or web address] Please also follow instruction sheet, as given.  NO CHARGE AND NEEDS TO TAKE CARVEDILOL AM OF TEST PER DR  Miarose Lippert   Follow-Up: At [facility], you and your health needs are our priority.  As part of our continuing mission to provide you with exceptional heart care, we have created designated Provider Care Teams.  These Care Teams include your primary Cardiologist (physician) and Advanced Practice Providers (APPs -  Physician Assistants and Nurse Practitioners) who all work together to provide you with the care you need, when you need it.  We recommend signing up for the patient portal called "MyChart".  Sign up information is provided on this After Visit Summary.  MyChart is used to connect with patients for Virtual Visits (Telemedicine).  Patients are able to view lab/test results, encounter notes, upcoming appointments, etc.  Non-urgent messages can be sent to your provider as well.   To learn more about what you can do with MyChart, go to [email or web address].    Your next appointment:   6 month(s)  The format for your next appointment:   In Person  Provider:   You may see Tobias Alexander, MD or one of the following Advanced Practice Providers on your designated Care Team:    Ronie Spies, PA-C  Jacolyn Reedy, PA-C         Signed, Tobias Alexander, MD  07/27/2019 9:03 AM    [facility]

## 2019-07-27 NOTE — Patient Instructions (Signed)
Medication Instructions:  Your physician recommends that you continue on your current medications as directed. Please refer to the Current Medication list given to you today.  *If you need a refill on your cardiac medications before your next appointment, please call your pharmacy*    Testing/Procedures: Your physician has requested that you have an exercise tolerance test. For further information please visit [email or web address] Please also follow instruction sheet, as given.  NO CHARGE AND NEEDS TO TAKE CARVEDILOL AM OF TEST PER DR [name]   Follow-Up: At [facility], you and your health needs are our priority.  As part of our continuing mission to provide you with exceptional heart care, we have created designated Provider Care Teams.  These Care Teams include your primary Cardiologist (physician) and Advanced Practice Providers (APPs -  Physician Assistants and Nurse Practitioners) who all work together to provide you with the care you need, when you need it.  We recommend signing up for the patient portal called "MyChart".  Sign up information is provided on this After Visit Summary.  MyChart is used to connect with patients for Virtual Visits (Telemedicine).  Patients are able to view lab/test results, encounter notes, upcoming appointments, etc.  Non-urgent messages can be sent to your provider as well.   To learn more about what you can do with MyChart, go to [email or web address].    Your next appointment:   6 month(s)  The format for your next appointment:   In Person  Provider:   You may see Tobias Alexander, MD or one of the following Advanced Practice Providers on your designated Care Team:    Ronie Spies, PA-C  Jacolyn Reedy, PA-C

## 2019-08-03 ENCOUNTER — Telehealth: Payer: Self-pay | Admitting: Internal Medicine

## 2019-08-03 NOTE — Telephone Encounter (Signed)
Spoke with pt and made her aware that she did need to do the covid test.  Pt verbalized understanding and was appreciative for call.

## 2019-08-03 NOTE — Telephone Encounter (Signed)
New Message:     Pt is scheduled for a Stress Test on 08-14-19. She wants to know  If she can by pass the COVID Test, since she have her COVID Vaccine and was in the office on 07-27-19 to see Dr Delton See. She is a pt of Dr Delton See and Dr Graciela Husbands.

## 2019-08-10 ENCOUNTER — Other Ambulatory Visit (HOSPITAL_COMMUNITY)
Admission: RE | Admit: 2019-08-10 | Discharge: 2019-08-10 | Disposition: A | Discharge status: Home / Self Care | Payer: Medicare Other | Source: Ambulatory Visit | Attending: Cardiology | Admitting: Cardiology

## 2019-08-10 ENCOUNTER — Other Ambulatory Visit (HOSPITAL_COMMUNITY): Payer: BLUE CROSS/BLUE SHIELD

## 2019-08-10 DIAGNOSIS — Z01812 Encounter for preprocedural laboratory examination: Secondary | ICD-10-CM | POA: Insufficient documentation

## 2019-08-10 DIAGNOSIS — Z20822 Contact with and (suspected) exposure to covid-19: Secondary | ICD-10-CM | POA: Diagnosis not present

## 2019-08-11 LAB — SARS CORONAVIRUS 2 (TAT 6-24 HRS): SARS Coronavirus 2: NEGATIVE

## 2019-08-14 ENCOUNTER — Other Ambulatory Visit: Payer: Self-pay

## 2019-08-14 ENCOUNTER — Ambulatory Visit (INDEPENDENT_AMBULATORY_CARE_PROVIDER_SITE_OTHER): Payer: Self-pay

## 2019-08-14 DIAGNOSIS — I420 Dilated cardiomyopathy: Secondary | ICD-10-CM

## 2019-08-14 DIAGNOSIS — I472 Ventricular tachycardia, unspecified: Secondary | ICD-10-CM

## 2019-08-14 LAB — EXERCISE TOLERANCE TEST
Estimated workload: 7.9 METS
Exercise duration (min): 6 min
Exercise duration (sec): 19 s
MPHR: 155 {beats}/min
Peak HR: 129 {beats}/min
Percent HR: 83 %
RPE: 15
Rest HR: 59 {beats}/min

## 2019-09-27 ENCOUNTER — Other Ambulatory Visit: Payer: Self-pay | Admitting: Internal Medicine

## 2019-10-05 ENCOUNTER — Other Ambulatory Visit: Payer: Self-pay | Admitting: Cardiology

## 2019-10-05 DIAGNOSIS — I472 Ventricular tachycardia, unspecified: Secondary | ICD-10-CM

## 2019-10-05 DIAGNOSIS — I428 Other cardiomyopathies: Secondary | ICD-10-CM

## 2019-11-14 ENCOUNTER — Other Ambulatory Visit: Payer: Self-pay | Admitting: Cardiology

## 2019-11-15 MED ORDER — OLMESARTAN MEDOXOMIL 20 MG PO TABS: 20.0000 mg | ORAL_TABLET | Freq: Every day | ORAL | 2 refills | Status: DC

## 2019-12-05 ENCOUNTER — Other Ambulatory Visit: Payer: Self-pay | Admitting: Cardiology

## 2020-03-08 IMAGING — CT CT HEAR MORPH WITH CTA COR WITH SCORE WITH CA WITH CONTRAST AND
4 of 7 series · 8 of 20 positions shown, 9 images · IV contrast (APPLIED)
Comparison: None.
COMPARISON: None.

Addendum:
EXAM:
OVER-READ INTERPRETATION  CT CHEST

The following report is an over-read performed by radiologist Dr.
does not include interpretation of cardiac or coronary anatomy or
pathology. The cardiac/coronary CTA interpretation by the
cardiologist is attached.
CLINICAL DATA: 63-year-old female with cardiomyopathy of unknown
etiology and ventricular tachycardia on a stress test.
Cardiac/Coronary  CT
TECHNIQUE: The patient was scanned on a Phillips Force scanner.

[Series 6: best diast 76 % · axial · 0.39mm/px · z∈[+1021,+1065]mm · 2 of 331 slices shown, 3 images]
[im 111/331  vessel]
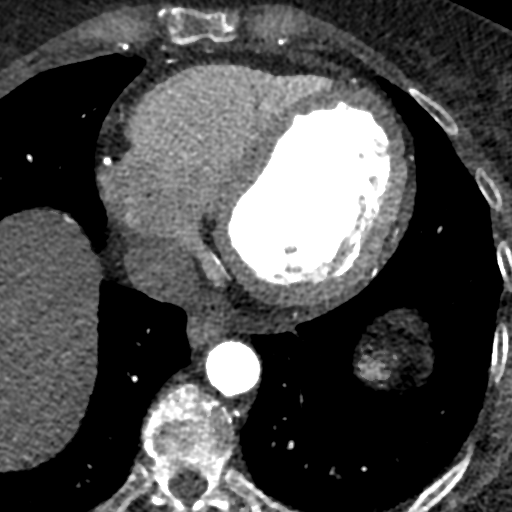
[im 111/331  lung]
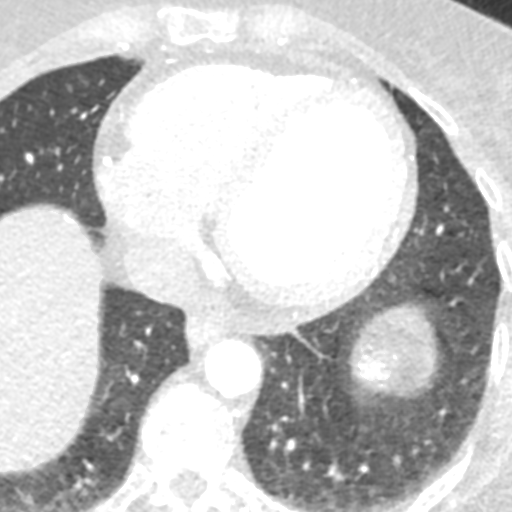
[im 221/331  vessel]
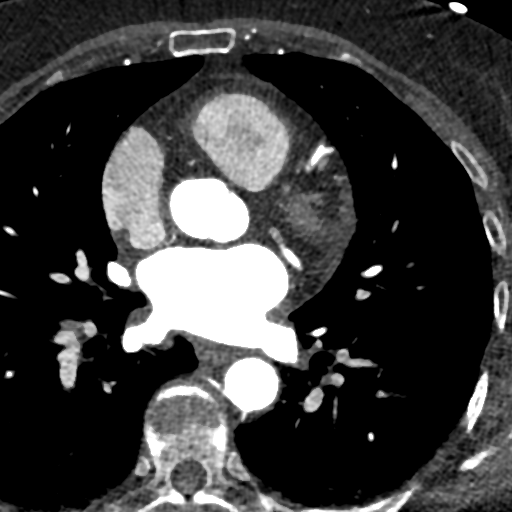

[Series 7: best syst 40 % · axial · 0.39mm/px · z∈[+1021,+1065]mm · 2 of 331 slices shown]
[im 111/331  vessel]
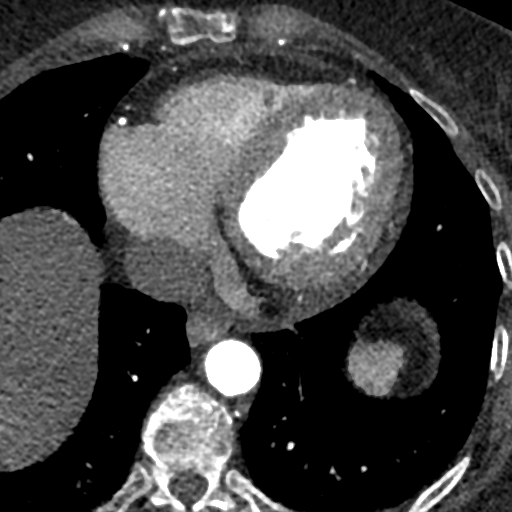
[im 221/331  vessel]
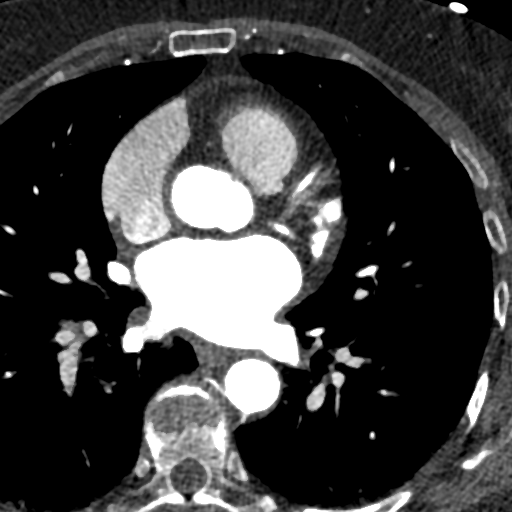

[Series 8: ts diast sharp 76 % · axial · 0.39mm/px · z∈[+1021,+1065]mm · 2 of 331 slices shown]
[im 111/331  lung]
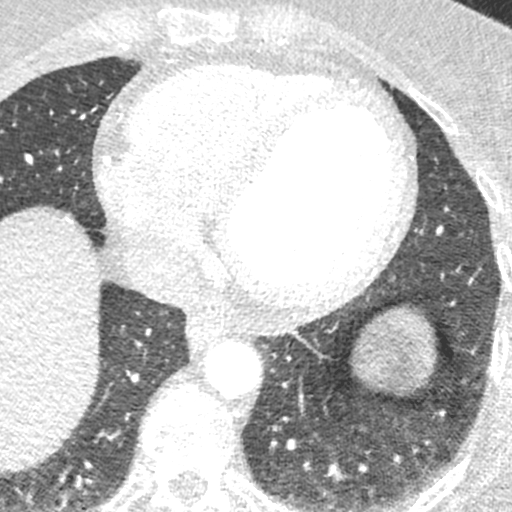
[im 221/331  lung]
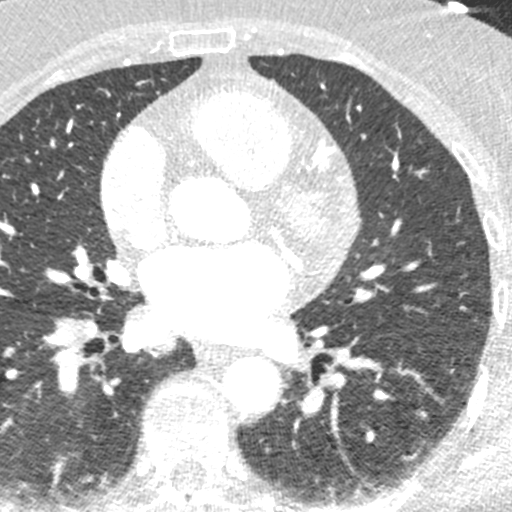

[Series 9: ts syst sharp 40 % · axial · 0.39mm/px · z∈[+1021,+1065]mm · 2 of 331 slices shown]
[im 111/331  lung]
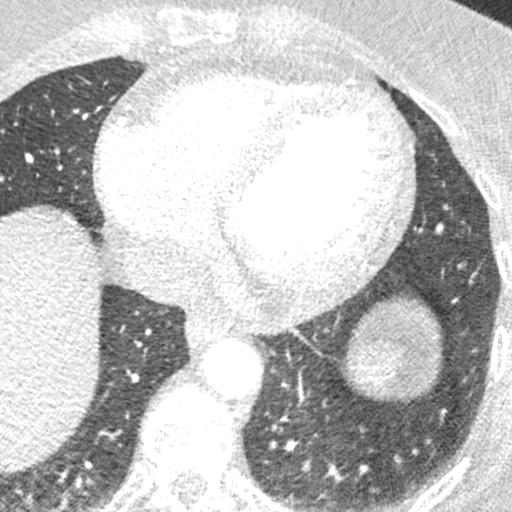
[im 221/331  lung]
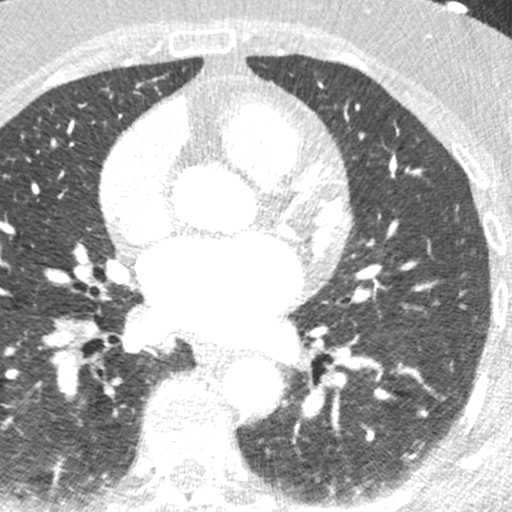

[8 of 20 positions shown; findings below may reference images not displayed]

FINDINGS: The visualized portions of the lower lung fields show no suspicious
nodules, masses, or infiltrates. The visualized portions of the
mediastinum and chest wall are unremarkable.
IMPRESSION: No significant non-cardiac abnormality in visualized portion of the
thorax.
FINDINGS: A 120 kV prospective scan was triggered in the descending thoracic
aorta at 111 HU's. Axial non-contrast 3 mm slices were carried out
through the heart. The data set was analyzed on a dedicated work
station and scored using the Agatson method. Gantry rotation speed
was 250 msecs and collimation was .6 mm. No beta blockade and 0.8 mg
of sl NTG was given. The 3D data set was reconstructed in 5%
intervals of the 67-82 % of the R-R cycle. Diastolic phases were
analyzed on a dedicated work station using MPR, MIP and VRT modes.
The patient received 80 cc of contrast.

Aorta:  Normal size.  No calcifications.  No dissection.

Aortic Valve:  Trileaflet.  No calcifications.

Coronary Arteries:  Normal coronary origin.  Right dominance.

RCA is a large dominant artery that gives rise to PDA and PLVB.
There is no plaque.

Left main is a short artery that gives rise to LAD and LCX arteries.
Left main has no plaque.

LAD is a large vessel that gives rise to one diagonal artery. There
is no plaque.

LCX is a non-dominant artery that gives rise to one large OM1
branch. There is no plaque.

Other findings:

Normal pulmonary vein drainage into the left atrium.

Normal let atrial appendage without a thrombus.

Normal size of the pulmonary artery.
IMPRESSION: 1. Coronary calcium score of 0. This was 0 percentile for age and
sex matched control.

2. Normal coronary origin with right dominance.

3. No evidence of CAD.

*** End of Addendum ***
EXAM:
OVER-READ INTERPRETATION  CT CHEST

The following report is an over-read performed by radiologist Dr.
does not include interpretation of cardiac or coronary anatomy or
pathology. The cardiac/coronary CTA interpretation by the
cardiologist is attached.
FINDINGS: The visualized portions of the lower lung fields show no suspicious
nodules, masses, or infiltrates. The visualized portions of the
mediastinum and chest wall are unremarkable.
IMPRESSION: No significant non-cardiac abnormality in visualized portion of the
thorax.

## 2020-03-10 ENCOUNTER — Encounter: Payer: Self-pay | Admitting: Cardiology

## 2020-03-10 ENCOUNTER — Other Ambulatory Visit: Payer: Self-pay

## 2020-03-10 ENCOUNTER — Ambulatory Visit (INDEPENDENT_AMBULATORY_CARE_PROVIDER_SITE_OTHER): Payer: Medicare Other | Admitting: Cardiology

## 2020-03-10 VITALS — BP 138/64 | HR 50 | Ht 63.0 in | Wt 154.6 lb

## 2020-03-10 DIAGNOSIS — I472 Ventricular tachycardia: Secondary | ICD-10-CM | POA: Diagnosis not present

## 2020-03-10 DIAGNOSIS — I428 Other cardiomyopathies: Secondary | ICD-10-CM | POA: Diagnosis not present

## 2020-03-10 DIAGNOSIS — I4729 Other ventricular tachycardia: Secondary | ICD-10-CM

## 2020-03-10 DIAGNOSIS — I10 Essential (primary) hypertension: Secondary | ICD-10-CM | POA: Diagnosis not present

## 2020-03-10 DIAGNOSIS — E782 Mixed hyperlipidemia: Secondary | ICD-10-CM | POA: Diagnosis not present

## 2020-03-10 LAB — COMPREHENSIVE METABOLIC PANEL
ALT: 9 IU/L (ref 0–32)
AST: 12 IU/L (ref 0–40)
Albumin/Globulin Ratio: 1.5 (ref 1.2–2.2)
Albumin: 4.1 g/dL (ref 3.8–4.8)
Alkaline Phosphatase: 65 IU/L (ref 44–121)
BUN/Creatinine Ratio: 19 (ref 12–28)
BUN: 20 mg/dL (ref 8–27)
Bilirubin Total: 0.7 mg/dL (ref 0.0–1.2)
CO2: 23 mmol/L (ref 20–29)
Calcium: 9 mg/dL (ref 8.7–10.3)
Chloride: 106 mmol/L (ref 96–106)
Creatinine, Ser: 1.05 mg/dL — ABNORMAL HIGH (ref 0.57–1.00)
GFR calc Af Amer: 64 mL/min/{1.73_m2} (ref >59)
GFR calc non Af Amer: 56 mL/min/{1.73_m2} — ABNORMAL LOW (ref >59)
Globulin, Total: 2.7 g/dL (ref 1.5–4.5)
Glucose: 98 mg/dL (ref 65–99)
Potassium: 4.7 mmol/L (ref 3.5–5.2)
Sodium: 141 mmol/L (ref 134–144)
Total Protein: 6.8 g/dL (ref 6.0–8.5)

## 2020-03-10 LAB — CBC
Hematocrit: 35.2 % (ref 34.0–46.6)
Hemoglobin: 12.1 g/dL (ref 11.1–15.9)
MCH: 32.7 pg (ref 26.6–33.0)
MCHC: 34.4 g/dL (ref 31.5–35.7)
MCV: 95 fL (ref 79–97)
Platelets: 297 10*3/uL (ref 150–450)
RBC: 3.7 x10E6/uL — ABNORMAL LOW (ref 3.77–5.28)
RDW: 12.2 % (ref 11.7–15.4)
WBC: 7.5 10*3/uL (ref 3.4–10.8)

## 2020-03-10 LAB — LIPID PANEL
Chol/HDL Ratio: 3.4 ratio (ref 0.0–4.4)
Cholesterol, Total: 205 mg/dL — ABNORMAL HIGH (ref 100–199)
HDL: 60 mg/dL (ref >39)
LDL Chol Calc (NIH): 129 mg/dL — ABNORMAL HIGH (ref 0–99)
Triglycerides: 89 mg/dL (ref 0–149)
VLDL Cholesterol Cal: 16 mg/dL (ref 5–40)

## 2020-03-10 LAB — TSH: TSH: 1.6 u[IU]/mL (ref 0.450–4.500)

## 2020-03-10 NOTE — Patient Instructions (Signed)
Medication Instructions:   Your physician recommends that you continue on your current medications as directed. Please refer to the Current Medication list given to you today.  *If you need a refill on your cardiac medications before your next appointment, please call your pharmacy*  Lab Work:  TODAY--CMET, CBC, TSH, AND LIPIDS  If you have labs (blood work) drawn today and your tests are completely normal, you will receive your results only by: Marland Kitchen MyChart Message (if you have MyChart) OR . A paper copy in the mail If you have any lab test that is abnormal or we need to change your treatment, we will call you to review the results.   Follow-Up:  6 MONTHS IN THE OFFICE WITH DR. Michaelle Birks SCHEDULE PATIENT FOR SOMETIME IN MAY 2022 PER DR. Delton See

## 2020-03-10 NOTE — Progress Notes (Signed)
Cardiology Office Note:    Date:  03/10/2020   ID:  [name], [name] [birth year], MRN [id number]  PCP:  Mateo Flow, MD  Cardiologist:  Ena Dawley, MD  Electrophysiologist:  None   Referring MD: Mateo Flow, MD   Reason for visit: 6 months follow up   History of Present Illness:    [name] is a 64 y.o. female with a hx of non-ischemic cardiomyopathy (negative stress test), LVEF 30-35% improved to 50-55% in 2016, at some point then back to 30-35%, now back to 55-60% in August 2020.  She has significant family history of coronary artery disease her mother died of massive myocardial infarction at age of 48, there are multiple other family members with CAD. Patient states that her lipids are followed by her primary care physician and are at goal. Exercise testing with gas exchange demonstrates normal functional capacity when compared to matched sedentary norms. There is no cardiopulmonary limitation. However, there was non-diagnostic ST- depression in lateral leads at peak exercise, with NSVT of approximately 15 consecutive seconds into passive recovery.   The patient is coming after 6 months, she is enjoying her retirement, she is fully active and completely asymptomatic, she has no symptoms of dyspnea or chest pain.  No palpitation dizziness or syncope.  No lower extremity edema no orthopnea or proximal nocturnal dyspnea.  She has been compliant with her medications and she is tolerating them well.  Past Medical History:  Diagnosis Date  . Esophageal reflux   . Osteoporosis   . Palpitations     Past Surgical History:  Procedure Laterality Date  . VAGINAL HYSTERECTOMY     Current Medications: Current Meds  Medication Sig  . carvedilol (COREG) 6.25 MG tablet TAKE 1 TABLET(6.25 MG) BY MOUTH TWICE DAILY  . eplerenone (INSPRA) 25 MG tablet TAKE 1 TABLET BY MOUTH EVERY DAY  . estradiol (ESTRACE) 0.1 MG/GM vaginal cream Place 1 Applicatorful vaginally at bedtime.   . meclizine (ANTIVERT) 12.5 MG tablet Take 1 tablet by mouth 3 (three) times daily as needed for dizziness.  Marland Kitchen olmesartan (BENICAR) 20 MG tablet Take 1 tablet (20 mg total) by mouth daily.    Allergies:   Cefdinir, Entresto [sacubitril-valsartan], and Aldactone [spironolactone]   Social History   Socioeconomic History  . Marital status: Married    Spouse name: Not on file  . Number of children: Not on file  . Years of education: Not on file  . Highest education level: Not on file  Occupational History  . Not on file  Tobacco Use  . Smoking status: Never Smoker  . Smokeless tobacco: Never Used  Substance and Sexual Activity  . Alcohol use: No  . Drug use: No  . Sexual activity: Not on file  Other Topics Concern  . Not on file  Social History Narrative  . Not on file   Social Determinants of Health   Financial Resource Strain:   . Difficulty of Paying Living Expenses: Not on file  Food Insecurity:   . Worried About Charity fundraiser in the Last Year: Not on file  . Ran Out of Food in the Last Year: Not on file  Transportation Needs:   . Lack of Transportation (Medical): Not on file  . Lack of Transportation (Non-Medical): Not on file  Physical Activity:   . Days of Exercise per Week: Not on file  . Minutes of Exercise per Session: Not on file  Stress:   . Feeling  of Stress : Not on file  Social Connections:   . Frequency of Communication with Friends and Family: Not on file  . Frequency of Social Gatherings with Friends and Family: Not on file  . Attends Religious Services: Not on file  . Active Member of Clubs or Organizations: Not on file  . Attends Archivist Meetings: Not on file  . Marital Status: Not on file    Family History: [name] family history includes CAD in an other family member; Diabetes in her maternal uncle; Heart attack (age of onset: 87) in her mother. There is no history of Thyroid disease or Stroke.  ROS:   Please see the  history of present illness.    All other systems reviewed and are negative.  EKGs/Labs/Other Studies Reviewed:    The following studies were reviewed today:  EKG:  EKG is ordered today.  The ekg ordered today demonstrates SB, normal ECG, unchanged from prior.  This was personally reviewed.  Recent Labs: No results found for requested labs within last 8760 hours.  Recent Lipid Panel    Component Value Date/Time   CHOL 193 12/07/2018 1259   TRIG 88 12/07/2018 1259   HDL 65 12/07/2018 1259   CHOLHDL 3.0 12/07/2018 1259   CHOLHDL 2.6 12/24/2015 0845   VLDL 16 12/24/2015 0845   LDLCALC 110 (H) 12/07/2018 1259   Physical Exam:    VS:  BP 138/64   Pulse (!) 50   Ht $R'5\' 3"'yn$  (1.6 m)   Wt 154 lb 9.6 oz (70.1 kg)   SpO2 97%   BMI 27.39 kg/m     Wt Readings from Last 3 Encounters:  03/10/20 154 lb 9.6 oz (70.1 kg)  07/27/19 155 lb (70.3 kg)  12/07/18 147 lb (66.7 kg)     GEN:  Well nourished, well developed in no acute distress HEENT: Normal NECK: No JVD; No carotid bruits LYMPHATICS: No lymphadenopathy CARDIAC: RRR, no murmurs, rubs, gallops RESPIRATORY:  Clear to auscultation without rales, wheezing or rhonchi  ABDOMEN: Soft, non-tender, non-distended MUSCULOSKELETAL:  No edema; No deformity  SKIN: Warm and dry NEUROLOGIC:  Alert and oriented x 3 PSYCHIATRIC:  Normal affect   TTE: 11/2018  1. The average left ventricular global longitudinal strain is -17.9 %.  2. The left ventricle has normal systolic function, with an ejection  fraction of 55-60%. The cavity size was normal. Left ventricular diastolic  parameters were normal.  3. The right ventricle has normal systolic function. The cavity was  normal. There is no increase in right ventricular wall thickness. Right  ventricular systolic pressure is normal with an estimated pressure of 23.2  mmHg.  4. The aortic valve is tricuspid. Mild sclerosis of the aortic valve.  Aortic valve regurgitation is trivial by color  flow Doppler.  5. The aorta is normal unless otherwise noted.     ASSESSMENT:    1. Essential hypertension   2. Nonischemic cardiomyopathy ([place])   3. Mixed hyperlipidemia   4. NSVT (nonsustained ventricular tachycardia) ([facility])    PLAN:    In order of problems listed above:  1.  Nonischemic cardiomyopathy  -most recent LVEF to 55-60% in August 2020, there is no reason to repeat echocardiogram right now, will continue the same regimen with eplerenone, and olmesartan.  She tolerates it well and she is functional class NYHA I.   2.  Exercise induced ventricular tachycardia's -symptoms resolved on carvedilol.  Repeat stress test in May 2021 that showed only 3 beats  of nonsustained ventricular tachycardia in recovery.  We will continue the same dose of carvedilol.  3. Hypertension -repeat blood pressure today 124/64 mmHg.  Medication Adjustments/Labs and Tests Ordered: Current medicines are reviewed at length with the patient today.  Concerns regarding medicines are outlined above.  Orders Placed This Encounter  Procedures  . Comp Met (CMET)  . CBC  . TSH  . Lipid Profile  . EKG 12-Lead   No orders of the defined types were placed in this encounter.  Patient Instructions  Medication Instructions:   Your physician recommends that you continue on your current medications as directed. Please refer to the Current Medication list given to you today.  *If you need a refill on your cardiac medications before your next appointment, please call your pharmacy*  Lab Work:  TODAY--CMET, CBC, TSH, AND LIPIDS  If you have labs (blood work) drawn today and your tests are completely normal, you will receive your results only by: Marland Kitchen MyChart Message (if you have MyChart) OR . A paper copy in the mail If you have any lab test that is abnormal or we need to change your treatment, we will call you to review the results.   Follow-Up:  6 MONTHS IN THE OFFICE WITH DR. Benita Stabile SCHEDULE  PATIENT FOR SOMETIME IN MAY 2022 PER DR. Meda Coffee     Signed, Ena Dawley, MD  03/10/2020 11:10 AM    [place]

## 2020-06-05 ENCOUNTER — Telehealth: Payer: Self-pay | Admitting: Cardiology

## 2020-06-05 MED ORDER — BENICAR 20 MG PO TABS
20.0000 mg | ORAL_TABLET | Freq: Every day | ORAL | 2 refills | Status: DC
Start: 2020-06-05 — End: 2021-02-26

## 2020-06-05 NOTE — Telephone Encounter (Signed)
Pt c/o medication issue:  1. Name of Medication: olmesartan (BENICAR) 20 MG tablet  2. How are you currently taking this medication (dosage and times per day)? As written  3. Are you having a reaction (difficulty breathing--STAT)? No   4. What is your medication issue? Patient needs a new prescription for the non-generic brand sent to [facility] [phone] - [place], [place] - [address] AT [address] OF [address]

## 2020-06-05 NOTE — Telephone Encounter (Signed)
Rx for brand name Benicar 20 mg po daily sent into the pts confirmed pharmacy of choice, as requested.  Note placed to pharmacy to DAW.

## 2020-09-01 ENCOUNTER — Telehealth: Payer: Self-pay

## 2020-09-01 ENCOUNTER — Other Ambulatory Visit: Payer: Self-pay

## 2020-09-01 ENCOUNTER — Ambulatory Visit: Payer: BLUE CROSS/BLUE SHIELD | Admitting: Cardiology

## 2020-09-01 MED ORDER — EPLERENONE 25 MG PO TABS
25.0000 mg | ORAL_TABLET | Freq: Every day | ORAL | 0 refills | Status: DC
Start: 2020-09-01 — End: 2020-12-04

## 2020-09-01 NOTE — Telephone Encounter (Signed)
**Note De-Identified Karanveer Ramakrishnan Obfuscation** Following message received from covermymeds: This request has received a Cancelled outcome.  This may mean either your patient does not have active coverage with this plan, this authorization was processed as a duplicate request, or an authorization was not needed for this medication.  Note any additional information provided by [facility] [place] at the bottom of this request, and contact [employer]  directly for further details.  I called [facility] and was given the pts new ins card information as follows: ID [id number] PCN: [id number] JJO:[id number]

## 2020-09-01 NOTE — Telephone Encounter (Signed)
**Note De-Identified [name] Obfuscation** I started a name brand Benicar PA through covermymeds. Key: [id number]

## 2020-09-01 NOTE — Telephone Encounter (Signed)
**Note De-Identified [name] Obfuscation** Pts new ins card information as follows: ID [id number] PCN: [id number] CXF:[id number]  2nd Benicar PA started through covermymeds. Key: [id number]

## 2020-09-02 NOTE — Telephone Encounter (Signed)
**Note De-Identified Vy Badley Obfuscation** Message received through covermymeds: Vicenta Dunning Key: [id number] - PA Case ID: [id number] Outcome: Approved on May 23 Coverage Start Date:08/02/2020;Coverage End Date:09/01/2021 Drug: Benicar 20MG  tablets Form: [employer] Electronic PA Form (2017 NCPDP)  I have notified [facility] pharmacy of this approval.

## 2020-09-07 NOTE — Progress Notes (Signed)
Cardiology Office Note:    Date:  09/09/2020   ID:  Kierstan, Auer 1955/10/10, MRN [id number]  PCP:  [name], MD   [facility] Providers Cardiologist:  Tobias Alexander, MD (Inactive) {   Referring MD: [name], MD     History of Present Illness:    [name] is a 65 y.o. female with a hx of nonischemic cardiomyopathy with recovered EF and palpitations who was previously followed by Dr. Delton See who now returns to clinic for follow-up of her HF.  Per review of the record, patient has hisotry of NICM with negative stress test with initial LVEF 30-35% that improved to 50-55% in 2016 but then went back down to 30-35% and then finally recovered back to 55-60% in 11/2018.  Last saw Dr. Delton See 02/2020 where she was doing well with no HF symptoms.  Today, the patient states that she feels well. No chest pain, SOB, LE edema, orthopnea, or PND. Tolerating medications without issues. Blood pressure well controlled. She remains active with no exertional symptoms. She states she has had significant side-effects to medications and is hesitant to start anything new.    Past Medical History:  Diagnosis Date  . Esophageal reflux   . Osteoporosis   . Palpitations     Past Surgical History:  Procedure Laterality Date  . VAGINAL HYSTERECTOMY      Current Medications: Current Meds  Medication Sig  . BENICAR 20 MG tablet Take 1 tablet (20 mg total) by mouth daily.  . carvedilol (COREG) 6.25 MG tablet TAKE 1 TABLET(6.25 MG) BY MOUTH TWICE DAILY  . eplerenone (INSPRA) 25 MG tablet Take 1 tablet (25 mg total) by mouth daily.  Marland Kitchen estradiol (ESTRACE) 0.1 MG/GM vaginal cream Place 1 Applicatorful vaginally at bedtime.  . meclizine (ANTIVERT) 12.5 MG tablet Take 1 tablet by mouth 3 (three) times daily as needed for dizziness.     Allergies:   Cefdinir, Entresto [sacubitril-valsartan], and Aldactone [spironolactone]   Social History   Socioeconomic History  . Marital  status: Married    Spouse name: Not on file  . Number of children: Not on file  . Years of education: Not on file  . Highest education level: Not on file  Occupational History  . Not on file  Tobacco Use  . Smoking status: Never Smoker  . Smokeless tobacco: Never Used  Substance and Sexual Activity  . Alcohol use: No  . Drug use: No  . Sexual activity: Not on file  Other Topics Concern  . Not on file  Social History Narrative  . Not on file   Social Determinants of Health   Financial Resource Strain: Not on file  Food Insecurity: Not on file  Transportation Needs: Not on file  Physical Activity: Not on file  Stress: Not on file  Social Connections: Not on file     Family History: [name] family history includes CAD in an other family member; Diabetes in her maternal uncle; Heart attack (age of onset: 79) in her mother. There is no history of Thyroid disease or Stroke.  ROS:   Please see the history of present illness.    Review of Systems  Constitutional: Negative for chills and fever.  HENT: Negative for sore throat.   Eyes: Negative for blurred vision.  Respiratory: Negative for shortness of breath.   Cardiovascular: Negative for chest pain, palpitations, orthopnea, claudication, leg swelling and PND.  Gastrointestinal: Negative for nausea and vomiting.  Genitourinary: Negative  for dysuria.  Musculoskeletal: Negative for falls.  Neurological: Negative for dizziness and loss of consciousness.  Psychiatric/Behavioral: Negative for substance abuse.    EKGs/Labs/Other Studies Reviewed:    The following studies were reviewed today: ETT September 16, 2019:  Blood pressure demonstrated a normal response to exercise.  There was no ST segment deviation noted during stress.   ETT with fair exercise tolerance (6:19); no chest pain; normal blood pressure response; no diagnostic ST changes; study terminated due to episode of 6 beats nonsustained, polymorphic ventricular  tachycardia; patient achieved 83% target heart rate; negative inadequate exercise treadmill.  TTE 12/07/19: IMPRESSIONS  1. The average left ventricular global longitudinal strain is -17.9 %.  2. The left ventricle has normal systolic function, with an ejection  fraction of 55-60%. The cavity size was normal. Left ventricular diastolic  parameters were normal.  3. The right ventricle has normal systolic function. The cavity was  normal. There is no increase in right ventricular wall thickness. Right  ventricular systolic pressure is normal with an estimated pressure of 23.2  mmHg.  4. The aortic valve is tricuspid. Mild sclerosis of the aortic valve.  Aortic valve regurgitation is trivial by color flow Doppler.  5. The aorta is normal unless otherwise noted.  Cardiac monitor 10/2018: Duration: 13d  Findings VT nonsustained x 3; longest 9 beats accelerating from 104 t 140 ; fastest   @ 150 X 4 b  Assoc with Symptoms  SVT nonsustained  Up to 6 beats @ 200 bpm   Assoc with symptoms   Nocturnal bradycardia >> 45   Triggered Events-- as above  Her bradycardia makes these hard to treat; in and of themselves they are not life threatening-- if she decides to consider an ICD then the bradycardia would be obviated  For now iwll just follow   EKG:  EKG is  ordered today.  The ekg ordered today demonstrates sinus bradycardia 54.  Recent Labs: 03/10/2020: ALT 9; BUN 20; Creatinine, Ser 1.05; Hemoglobin 12.1; Platelets 297; Potassium 4.7; Sodium 141; TSH 1.600  Recent Lipid Panel    Component Value Date/Time   CHOL 205 (H) 03/10/2020 1006   TRIG 89 03/10/2020 1006   HDL 60 03/10/2020 1006   CHOLHDL 3.4 03/10/2020 1006   CHOLHDL 2.6 12/24/2015 0845   VLDL 16 12/24/2015 0845   LDLCALC 129 (H) 03/10/2020 1006      Physical Exam:    VS:  BP 128/76   Pulse (!) 54   Ht 5\' 3"  (1.6 m)   Wt 155 lb 12.8 oz (70.7 kg)   SpO2 99%   BMI 27.60 kg/m     Wt Readings from Last  3 Encounters:  09/09/20 155 lb 12.8 oz (70.7 kg)  03/10/20 154 lb 9.6 oz (70.1 kg)  07/27/19 155 lb (70.3 kg)     GEN:  Well nourished, well developed in no acute distress HEENT: Normal NECK: No JVD; No carotid bruits CARDIAC: Bradycardic, regular, no murmurs, rubs, gallops RESPIRATORY:  Clear to auscultation without rales, wheezing or rhonchi  ABDOMEN: Soft, non-tender, non-distended MUSCULOSKELETAL:  No edema; No deformity  SKIN: Warm and dry NEUROLOGIC:  Alert and oriented x 3 PSYCHIATRIC:  Normal affect   ASSESSMENT:    1. Chronic systolic CHF (congestive heart failure), NYHA class 2 (HCC)   2. Acute on chronic systolic CHF (congestive heart failure) (HCC)   3. Ventricular tachycardia (HCC)   4. Essential hypertension   5. Mixed hyperlipidemia    PLAN:    In  order of problems listed above:  #Chronic combined diastolic and systolic heart failure with improved LVEF 45-50%: #Non-ischemic CM: Possible viral etiology vs hereditary. EF as low as 30-35% now improved back to 45-50% on TTE 11/2018. No HF symptoms and patient is NYHA class I-II. -Repeat TTE for surveillance -Continue coreg 6.25mg  BID -Continue eplerenone 25mg  daily -Continue benicar (olmesartan) 20mg  daily -Declined farxiga/jardiance today; will re-consider pending TTE -Low Na diet  #Exercise induced VT: Noted on ETT. Repeat stress showed improvement with initiation of coreg. -Continue coreg 6.25mg  BID  #HTN: Controlled at home running 120s. -Continue coreg 6.25mg  BID -Continue eplerenone 25mg  daily -Continue benicar (olmesartan) 20mg  daily  #HLD: LDL borderline at 129 on 02/2020. Not on statins. No known CAD.  -Continue diet and lifestyle modifications -Repeat cholesterol at next visit   Medication Adjustments/Labs and Tests Ordered: Current medicines are reviewed at length with the patient today.  Concerns regarding medicines are outlined above.  Orders Placed This Encounter  Procedures  . EKG  12-Lead  . ECHOCARDIOGRAM COMPLETE   No orders of the defined types were placed in this encounter.   Patient Instructions  Medication Instructions:   Your physician recommends that you continue on your current medications as directed. Please refer to the Current Medication list given to you today.  *If you need a refill on your cardiac medications before your next appointment, please call your pharmacy*   Testing/Procedures:  Your physician has requested that you have an echocardiogram. Echocardiography is a painless test that uses sound waves to create images of your heart. It provides your doctor with information about the size and shape of your heart and how well your heart's chambers and valves are working. This procedure takes approximately one hour. There are no restrictions for this procedure.   Follow-Up: At [facility], you and your health needs are our priority.  As part of our continuing mission to provide you with exceptional heart care, we have created designated Provider Care Teams.  These Care Teams include your primary Cardiologist (physician) and Advanced Practice Providers (APPs -  Physician Assistants and Nurse Practitioners) who all work together to provide you with the care you need, when you need it.  We recommend signing up for the patient portal called "MyChart".  Sign up information is provided on this After Visit Summary.  MyChart is used to connect with patients for Virtual Visits (Telemedicine).  Patients are able to view lab/test results, encounter notes, upcoming appointments, etc.  Non-urgent messages can be sent to your provider as well.   To learn more about what you can do with MyChart, go to .    Your next appointment:   6 month(s)  The format for your next appointment:   In Person  Provider:   You will see one of the following Advanced Practice Providers on your designated Care Team:    , PA-C  Vin [place],  [facility]  DAYNA DUNN PA-C  [email or web address] NP  [facility] PA-C  JILL MCDANIEL NP        Signed, [place], MD  09/09/2020 10:24 AM    [facility]

## 2020-09-09 ENCOUNTER — Ambulatory Visit (INDEPENDENT_AMBULATORY_CARE_PROVIDER_SITE_OTHER): Payer: Medicare Other | Admitting: Cardiology

## 2020-09-09 ENCOUNTER — Encounter: Payer: Self-pay | Admitting: Cardiology

## 2020-09-09 ENCOUNTER — Other Ambulatory Visit: Payer: Self-pay

## 2020-09-09 VITALS — BP 128/76 | HR 54 | Ht 63.0 in | Wt 155.8 lb

## 2020-09-09 DIAGNOSIS — I10 Essential (primary) hypertension: Secondary | ICD-10-CM | POA: Diagnosis not present

## 2020-09-09 DIAGNOSIS — I5022 Chronic systolic (congestive) heart failure: Secondary | ICD-10-CM | POA: Diagnosis not present

## 2020-09-09 DIAGNOSIS — I472 Ventricular tachycardia, unspecified: Secondary | ICD-10-CM

## 2020-09-09 DIAGNOSIS — I5023 Acute on chronic systolic (congestive) heart failure: Secondary | ICD-10-CM

## 2020-09-09 DIAGNOSIS — E782 Mixed hyperlipidemia: Secondary | ICD-10-CM

## 2020-09-09 NOTE — Patient Instructions (Signed)
Medication Instructions:   Your physician recommends that you continue on your current medications as directed. Please refer to the Current Medication list given to you today.  *If you need a refill on your cardiac medications before your next appointment, please call your pharmacy*   Testing/Procedures:  Your physician has requested that you have an echocardiogram. Echocardiography is a painless test that uses sound waves to create images of your heart. It provides your doctor with information about the size and shape of your heart and how well your heart's chambers and valves are working. This procedure takes approximately one hour. There are no restrictions for this procedure.   Follow-Up: At [facility], you and your health needs are our priority.  As part of our continuing mission to provide you with exceptional heart care, we have created designated Provider Care Teams.  These Care Teams include your primary Cardiologist (physician) and Advanced Practice Providers (APPs -  Physician Assistants and Nurse Practitioners) who all work together to provide you with the care you need, when you need it.  We recommend signing up for the patient portal called "MyChart".  Sign up information is provided on this After Visit Summary.  MyChart is used to connect with patients for Virtual Visits (Telemedicine).  Patients are able to view lab/test results, encounter notes, upcoming appointments, etc.  Non-urgent messages can be sent to your provider as well.   To learn more about what you can do with MyChart, go to [email or web address].    Your next appointment:   6 month(s)  The format for your next appointment:   In Person  Provider:   You will see one of the following Advanced Practice Providers on your designated Care Team:    Tereso Newcomer, PA-C  Vin [place], [place]  DAYNA DUNN PA-C  Nada Boozer NP  [facility] LENZE PA-C  JILL [facility] NP

## 2020-09-29 ENCOUNTER — Other Ambulatory Visit: Payer: Self-pay | Admitting: Internal Medicine

## 2020-10-07 ENCOUNTER — Other Ambulatory Visit: Payer: Self-pay

## 2020-10-07 ENCOUNTER — Ambulatory Visit (HOSPITAL_COMMUNITY): Payer: Medicare Other | Attending: Internal Medicine

## 2020-10-07 DIAGNOSIS — I5022 Chronic systolic (congestive) heart failure: Secondary | ICD-10-CM | POA: Diagnosis present

## 2020-10-07 DIAGNOSIS — I5023 Acute on chronic systolic (congestive) heart failure: Secondary | ICD-10-CM | POA: Diagnosis present

## 2020-10-07 LAB — ECHOCARDIOGRAM COMPLETE
Area-P 1/2: 3.42 cm2
S' Lateral: 3.8 cm

## 2020-12-04 ENCOUNTER — Telehealth: Payer: Self-pay | Admitting: Cardiology

## 2020-12-04 MED ORDER — EPLERENONE 25 MG PO TABS
25.0000 mg | ORAL_TABLET | Freq: Every day | ORAL | 3 refills | Status: DC
Start: 2020-12-04 — End: 2021-05-29

## 2020-12-04 NOTE — Telephone Encounter (Signed)
*  STAT* If patient is at the pharmacy, call can be transferred to refill team.   1. Which medications need to be refilled? (please list name of each medication and dose if known)  eplerenone (INSPRA) 25 MG tablet  2. Which pharmacy/location (including street and city if local pharmacy) is medication to be sent to? [facility] [phone] - [place], [place] - [address]  3. Do they need a 30 day or 90 day supply? 90 with refills    Patient is scheduled for a follow-up visit with Jacolyn Reedy 03/11/21

## 2020-12-04 NOTE — Telephone Encounter (Signed)
Pt has been made aware her refill has been sent in.

## 2020-12-04 NOTE — Telephone Encounter (Signed)
Patient mentioned that she will be out of medication tomorrow

## 2021-02-26 ENCOUNTER — Other Ambulatory Visit: Payer: Self-pay

## 2021-02-26 MED ORDER — BENICAR 20 MG PO TABS
20.0000 mg | ORAL_TABLET | Freq: Every day | ORAL | 1 refills | Status: DC
Start: 2021-02-26 — End: 2021-05-29

## 2021-03-06 ENCOUNTER — Telehealth: Payer: Self-pay | Admitting: Physician Assistant

## 2021-03-06 NOTE — Telephone Encounter (Signed)
   The patient called the answering service after-hours today. Tested positive for flu today. Was provided rx for Tamiflu but sx started much earlier this week so she states provider told her it might not give much relief at this pt. She is inquiring about safe OTC meds. I advised against use of decongestants like pseudoephedrine and phenylephrine. We also discussed that cold/flu meds often have Tylenol/acetaminophen in them so be careful not to overlap with separate Tylenol. She inquired about elderberry and I told her unfortunately I do not have enough data available to be able to crosscheck interactions with her cardiac meds. The patient verbalized understanding and gratitude.  Laurann Montana, PA-C

## 2021-03-11 ENCOUNTER — Ambulatory Visit: Payer: Medicare Other | Admitting: Physician Assistant

## 2021-03-30 ENCOUNTER — Other Ambulatory Visit: Payer: Self-pay | Admitting: *Deleted

## 2021-03-30 MED ORDER — CARVEDILOL 6.25 MG PO TABS
ORAL_TABLET | ORAL | 0 refills | Status: DC
Start: 2021-03-30 — End: 2021-05-29

## 2021-05-27 NOTE — Progress Notes (Deleted)
Cardiology Office Note:    Date:  05/27/2021   ID:  [name], Bridget 1955-12-20, MRN [id number]  PCP:  Mateo Flow, MD   [facility] Providers Cardiologist:  Ena Dawley, MD {   Referring MD: Mateo Flow, MD     History of Present Illness:    [name] is a 66 y.o. female with a hx of nonischemic cardiomyopathy with recovered EF and palpitations who was previously followed by Dr. Meda [name] who now returns to clinic for follow-up of her HF.  Per review of the record, patient has hisotry of NICM with negative stress test with initial LVEF 30-35% that improved to 50-55% in 2016 but then went back down to 30-35% and then finally recovered back to 55-60% in 11/2018.  Last seen in clinic on 08/2020 where she was doing well with no HF symptoms. TTE 10-18-20 with LVEF 55-60%, G1DD, normal strain, no significant valve disease   Past Medical History:  Diagnosis Date   Esophageal reflux    Osteoporosis    Palpitations     Past Surgical History:  Procedure Laterality Date   VAGINAL HYSTERECTOMY      Current Medications: No outpatient medications have been marked as taking for the 05/29/21 encounter (Appointment) with Freada Bergeron, MD.     Allergies:   Cefdinir, Entresto [sacubitril-valsartan], and Aldactone [spironolactone]   Social History   Socioeconomic History   Marital status: Married    Spouse name: Not on file   Number of children: Not on file   Years of education: Not on file   Highest education level: Not on file  Occupational History   Not on file  Tobacco Use   Smoking status: Never   Smokeless tobacco: Never  Substance and Sexual Activity   Alcohol use: No   Drug use: No   Sexual activity: Not on file  Other Topics Concern   Not on file  Social History Narrative   Not on file   Social Determinants of Health   Financial Resource Strain: Not on file  Food Insecurity: Not on file  Transportation Needs: Not on file  Physical  Activity: Not on file  Stress: Not on file  Social Connections: Not on file     Family History: [name] family history includes CAD in an other family member; Diabetes in her maternal uncle; Heart attack (age of onset: 79) in her mother. There is no history of Thyroid disease or Stroke.  ROS:   Please see the history of present illness.    Review of Systems  Constitutional:  Negative for chills and fever.  HENT:  Negative for sore throat.   Eyes:  Negative for blurred vision.  Respiratory:  Negative for shortness of breath.   Cardiovascular:  Negative for chest pain, palpitations, orthopnea, claudication, leg swelling and PND.  Gastrointestinal:  Negative for nausea and vomiting.  Genitourinary:  Negative for dysuria.  Musculoskeletal:  Negative for falls.  Neurological:  Negative for dizziness and loss of consciousness.  Psychiatric/Behavioral:  Negative for substance abuse.    EKGs/Labs/Other Studies Reviewed:    The following studies were reviewed today: TTE 10-18-20: IMPRESSIONS     1. Left ventricular ejection fraction, by estimation, is 55 to 60%. The  left ventricle has normal function. The left ventricle has no regional  wall motion abnormalities. Left ventricular diastolic parameters are  consistent with Grade I diastolic  dysfunction (impaired relaxation). The average left ventricular global  longitudinal strain is -21.5 %.  The global longitudinal strain is normal.   2. Right ventricular systolic function is normal. The right ventricular  size is normal. Tricuspid regurgitation signal is inadequate for assessing  PA pressure.   3. The mitral valve is grossly normal. No evidence of mitral valve  regurgitation. No evidence of mitral stenosis.   4. The aortic valve is tricuspid. Aortic valve regurgitation is trivial.  No aortic stenosis is present.   5. The inferior vena cava is normal in size with greater than 50%  respiratory variability, suggesting right  atrial pressure of 3 mmHg.  ETT 08/2019: Blood pressure demonstrated a normal response to exercise. There was no ST segment deviation noted during stress.   ETT with fair exercise tolerance (6:19); no chest pain; normal blood pressure response; no diagnostic ST changes; study terminated due to episode of 6 beats nonsustained, polymorphic ventricular tachycardia; patient achieved 83% target heart rate; negative inadequate exercise treadmill.  TTE 12/07/19: IMPRESSIONS   1. The average left ventricular global longitudinal strain is -17.9 %.   2. The left ventricle has normal systolic function, with an ejection  fraction of 55-60%. The cavity size was normal. Left ventricular diastolic  parameters were normal.   3. The right ventricle has normal systolic function. The cavity was  normal. There is no increase in right ventricular wall thickness. Right  ventricular systolic pressure is normal with an estimated pressure of 23.2  mmHg.   4. The aortic valve is tricuspid. Mild sclerosis of the aortic valve.  Aortic valve regurgitation is trivial by color flow Doppler.   5. The aorta is normal unless otherwise noted.  Cardiac monitor 10/2018: Duration: 13d   Findings VT nonsustained x 3; longest 9 beats accelerating from 104 t 140 ; fastest   @ 150 X 4 b  Assoc with Symptoms  SVT nonsustained  Up to 6 beats @ 200 bpm   Assoc with symptoms    Nocturnal bradycardia >> 45     Triggered Events-- as above   Her bradycardia makes these hard to treat; in and of themselves they are not life threatening-- if she decides to consider an ICD then the bradycardia would be obviated   For now iwll just follow   EKG:  EKG is  ordered today.  The ekg ordered today demonstrates sinus bradycardia 54.  Recent Labs: No results found for requested labs within last 8760 hours.  Recent Lipid Panel    Component Value Date/Time   CHOL 205 (H) 03/10/2020 1006   TRIG 89 03/10/2020 1006   HDL 60 03/10/2020  1006   CHOLHDL 3.4 03/10/2020 1006   CHOLHDL 2.6 12/24/2015 0845   VLDL 16 12/24/2015 0845   LDLCALC 129 (H) 03/10/2020 1006      Physical Exam:    VS:  There were no vitals taken for this visit.    Wt Readings from Last 3 Encounters:  09/09/20 155 lb 12.8 oz (70.7 kg)  03/10/20 154 lb 9.6 oz (70.1 kg)  07/27/19 155 lb (70.3 kg)     GEN:  Well nourished, well developed in no acute distress HEENT: Normal NECK: No JVD; No carotid bruits CARDIAC: Bradycardic, regular, no murmurs, rubs, gallops RESPIRATORY:  Clear to auscultation without rales, wheezing or rhonchi  ABDOMEN: Soft, non-tender, non-distended MUSCULOSKELETAL:  No edema; No deformity  SKIN: Warm and dry NEUROLOGIC:  Alert and oriented x 3 PSYCHIATRIC:  Normal affect   ASSESSMENT:    No diagnosis found.  PLAN:    In order  of problems listed above:  #Chronic combined diastolic and systolic heart failure with improved LVEF 45-50%: #Non-ischemic CM: Possible viral etiology vs hereditary. EF as low as 30-35% now improved back to 45-50% on TTE 11/2018. No HF symptoms and patient is NYHA class I-II. -Continue coreg 6.25mg  BID -Continue eplerenone 25mg  daily -Continue benicar (olmesartan) 20mg  daily -Declined farxiga/jardiance -Low Na diet  #Exercise induced VT: Noted on ETT. Repeat stress showed improvement with initiation of coreg. -Continue coreg 6.25mg  BID  #HTN: Controlled at home running 120s. -Continue coreg 6.25mg  BID -Continue eplerenone 25mg  daily -Continue benicar (olmesartan) 20mg  daily  #HLD: LDL borderline at 129 on 02/2020. Not on statins. No known CAD.  -Continue diet and lifestyle modifications -Repeat cholesterol at next visit   Medication Adjustments/Labs and Tests Ordered: Current medicines are reviewed at length with the patient today.  Concerns regarding medicines are outlined above.  No orders of the defined types were placed in this encounter.  No orders of the defined types  were placed in this encounter.   There are no Patient Instructions on file for this visit.    Signed, Freada Bergeron, MD  05/27/2021 6:48 AM    [place]

## 2021-05-29 ENCOUNTER — Other Ambulatory Visit: Payer: Self-pay

## 2021-05-29 ENCOUNTER — Ambulatory Visit (INDEPENDENT_AMBULATORY_CARE_PROVIDER_SITE_OTHER): Payer: Medicare Other | Admitting: Cardiology

## 2021-05-29 ENCOUNTER — Encounter: Payer: Self-pay | Admitting: Cardiology

## 2021-05-29 VITALS — BP 124/74 | HR 60 | Ht 63.0 in | Wt 152.8 lb

## 2021-05-29 DIAGNOSIS — I5022 Chronic systolic (congestive) heart failure: Secondary | ICD-10-CM | POA: Diagnosis not present

## 2021-05-29 DIAGNOSIS — I420 Dilated cardiomyopathy: Secondary | ICD-10-CM

## 2021-05-29 DIAGNOSIS — E782 Mixed hyperlipidemia: Secondary | ICD-10-CM

## 2021-05-29 DIAGNOSIS — I10 Essential (primary) hypertension: Secondary | ICD-10-CM | POA: Diagnosis not present

## 2021-05-29 DIAGNOSIS — Z79899 Other long term (current) drug therapy: Secondary | ICD-10-CM

## 2021-05-29 LAB — BASIC METABOLIC PANEL
BUN/Creatinine Ratio: 17 (ref 12–28)
BUN: 17 mg/dL (ref 8–27)
CO2: 26 mmol/L (ref 20–29)
Calcium: 9.5 mg/dL (ref 8.7–10.3)
Chloride: 102 mmol/L (ref 96–106)
Creatinine, Ser: 0.99 mg/dL (ref 0.57–1.00)
Glucose: 97 mg/dL (ref 70–99)
Potassium: 5.4 mmol/L — ABNORMAL HIGH (ref 3.5–5.2)
Sodium: 138 mmol/L (ref 134–144)
eGFR: 62 mL/min/{1.73_m2} (ref >59)

## 2021-05-29 LAB — LIPID PANEL
Chol/HDL Ratio: 3.6 ratio (ref 0.0–4.4)
Cholesterol, Total: 221 mg/dL — ABNORMAL HIGH (ref 100–199)
HDL: 62 mg/dL (ref >39)
LDL Chol Calc (NIH): 136 mg/dL — ABNORMAL HIGH (ref 0–99)
Triglycerides: 131 mg/dL (ref 0–149)
VLDL Cholesterol Cal: 23 mg/dL (ref 5–40)

## 2021-05-29 LAB — CBC
Hematocrit: 33 % — ABNORMAL LOW (ref 34.0–46.6)
Hemoglobin: 11.7 g/dL (ref 11.1–15.9)
MCH: 32.3 pg (ref 26.6–33.0)
MCHC: 35.5 g/dL (ref 31.5–35.7)
MCV: 91 fL (ref 79–97)
Platelets: 256 10*3/uL (ref 150–450)
RBC: 3.62 x10E6/uL — ABNORMAL LOW (ref 3.77–5.28)
RDW: 11.8 % (ref 11.7–15.4)
WBC: 6.2 10*3/uL (ref 3.4–10.8)

## 2021-05-29 LAB — TSH: TSH: 2.73 u[IU]/mL (ref 0.450–4.500)

## 2021-05-29 MED ORDER — BENICAR 20 MG PO TABS: 20.0000 mg | ORAL_TABLET | Freq: Every day | ORAL | 2 refills | Status: DC

## 2021-05-29 MED ORDER — EPLERENONE 25 MG PO TABS: 25.0000 mg | ORAL_TABLET | Freq: Every day | ORAL | 3 refills | Status: DC

## 2021-05-29 MED ORDER — CARVEDILOL 6.25 MG PO TABS: ORAL_TABLET | ORAL | 3 refills | Status: DC

## 2021-05-29 NOTE — Progress Notes (Signed)
Cardiology Office Note:    Date:  05/29/2021   ID:  Bridget, [name] 09/15/1955, MRN [id number]  PCP:  Mateo Flow, MD   [facility] Providers Cardiologist:  Ena Dawley, MD {   Referring MD: Mateo Flow, MD    History of Present Illness:    [name] is a 66 y.o. female with a hx of nonischemic cardiomyopathy with recovered EF and palpitations who was previously followed by Dr. Meda Coffee who now returns to clinic for follow-up of her HF.  Per review of the record, patient has hisotry of NICM with negative stress test with initial LVEF 30-35% that improved to 50-55% in 2016 but then went back down to 30-35% and then finally recovered back to 55-60% in 11/2018.  Last seen in clinic on 08/2020 where she was doing well with no HF symptoms. TTE 09/2020 with LVEF 55-60%, G1DD, normal strain, no significant valve disease  Today, the patient states that she is feeling great overall. Once in a while she may experience some mild palpitations, but this seems to be manageable lately.  For exercise she has been trying to walk when the weather permits. At home she tries to stay active as much as possible. She denies any exertional symptoms.  She confirms her current cholesterol management is through diet and exercise due to previous medication intolerances.  She denies any chest pain, shortness of breath, or peripheral edema. No lightheadedness, headaches, syncope, orthopnea, or PND.   Past Medical History:  Diagnosis Date   Esophageal reflux    Osteoporosis    Palpitations     Past Surgical History:  Procedure Laterality Date   VAGINAL HYSTERECTOMY      Current Medications: Current Meds  Medication Sig   BENICAR 20 MG tablet Take 1 tablet (20 mg total) by mouth daily.   carvedilol (COREG) 6.25 MG tablet TAKE 1 TABLET(6.25 MG) BY MOUTH TWICE DAILY   eplerenone (INSPRA) 25 MG tablet Take 1 tablet (25 mg total) by mouth daily.   estradiol (ESTRACE) 0.1 MG/GM  vaginal cream Place 1 Applicatorful vaginally at bedtime.   meclizine (ANTIVERT) 12.5 MG tablet Take 1 tablet by mouth 3 (three) times daily as needed for dizziness.     Allergies:   Cefdinir, Entresto [sacubitril-valsartan], and Aldactone [spironolactone]   Social History   Socioeconomic History   Marital status: Married    Spouse name: Not on file   Number of children: Not on file   Years of education: Not on file   Highest education level: Not on file  Occupational History   Not on file  Tobacco Use   Smoking status: Never   Smokeless tobacco: Never  Substance and Sexual Activity   Alcohol use: No   Drug use: No   Sexual activity: Not on file  Other Topics Concern   Not on file  Social History Narrative   Not on file   Social Determinants of Health   Financial Resource Strain: Not on file  Food Insecurity: Not on file  Transportation Needs: Not on file  Physical Activity: Not on file  Stress: Not on file  Social Connections: Not on file     Family History: [name] family history includes CAD in an other family member; Diabetes in her maternal uncle; Heart attack (age of onset: 24) in her mother. There is no history of Thyroid disease or Stroke.  ROS:   Please see the history of present illness.    Review of  Systems  Constitutional:  Negative for chills and fever.  HENT:  Negative for sore throat.   Eyes:  Negative for blurred vision.  Respiratory:  Negative for shortness of breath.   Cardiovascular:  Positive for palpitations. Negative for chest pain, orthopnea, claudication, leg swelling and PND.  Gastrointestinal:  Negative for nausea and vomiting.  Genitourinary:  Negative for dysuria.  Musculoskeletal:  Negative for falls.  Neurological:  Negative for dizziness and loss of consciousness.  Psychiatric/Behavioral:  Negative for substance abuse.    EKGs/Labs/Other Studies Reviewed:    The following studies were reviewed today:  TTE  09/2020: IMPRESSIONS    1. Left ventricular ejection fraction, by estimation, is 55 to 60%. The  left ventricle has normal function. The left ventricle has no regional  wall motion abnormalities. Left ventricular diastolic parameters are  consistent with Grade I diastolic  dysfunction (impaired relaxation). The average left ventricular global  longitudinal strain is -21.5 %. The global longitudinal strain is normal.   2. Right ventricular systolic function is normal. The right ventricular  size is normal. Tricuspid regurgitation signal is inadequate for assessing  PA pressure.   3. The mitral valve is grossly normal. No evidence of mitral valve  regurgitation. No evidence of mitral stenosis.   4. The aortic valve is tricuspid. Aortic valve regurgitation is trivial.  No aortic stenosis is present.   5. The inferior vena cava is normal in size with greater than 50%  respiratory variability, suggesting right atrial pressure of 3 mmHg.   ETT 08/2019: Blood pressure demonstrated a normal response to exercise. There was no ST segment deviation noted during stress.   ETT with fair exercise tolerance (6:19); no chest pain; normal blood pressure response; no diagnostic ST changes; study terminated due to episode of 6 beats nonsustained, polymorphic ventricular tachycardia; patient achieved 83% target heart rate; negative inadequate exercise treadmill.  TTE 12/07/19: IMPRESSIONS   1. The average left ventricular global longitudinal strain is -17.9 %.   2. The left ventricle has normal systolic function, with an ejection  fraction of 55-60%. The cavity size was normal. Left ventricular diastolic  parameters were normal.   3. The right ventricle has normal systolic function. The cavity was  normal. There is no increase in right ventricular wall thickness. Right  ventricular systolic pressure is normal with an estimated pressure of 23.2  mmHg.   4. The aortic valve is tricuspid. Mild sclerosis of  the aortic valve.  Aortic valve regurgitation is trivial by color flow Doppler.   5. The aorta is normal unless otherwise noted.  Cardiac monitor 10/2018: Duration: 13d   Findings VT nonsustained x 3; longest 9 beats accelerating from 104 t 140 ; fastest   @ 150 X 4 b  Assoc with Symptoms  SVT nonsustained  Up to 6 beats @ 200 bpm   Assoc with symptoms    Nocturnal bradycardia >> 45     Triggered Events-- as above   Her bradycardia makes these hard to treat; in and of themselves they are not life threatening-- if she decides to consider an ICD then the bradycardia would be obviated   For now I wll just follow  Coronary CT 08/22/2018: Aorta:  Normal size.  No calcifications.  No dissection.   Aortic Valve:  Trileaflet.  No calcifications.   Coronary Arteries:  Normal coronary origin.  Right dominance.   RCA is a large dominant artery that gives rise to PDA and PLVB. There is no plaque.  Left main is a short artery that gives rise to LAD and LCX arteries. Left main has no plaque.   LAD is a large vessel that gives rise to one diagonal artery. There is no plaque.   LCX is a non-dominant artery that gives rise to one large OM1 branch. There is no plaque.   Other findings:   Normal pulmonary vein drainage into the left atrium.   Normal let atrial appendage without a thrombus.   Normal size of the pulmonary artery.   IMPRESSION: 1. Coronary calcium score of 0. This was 0 percentile for age and sex matched control.   2. Normal coronary origin with right dominance.   3. No evidence of CAD.   EKG:  EKG is personally reviewed. 05/29/2021: EKG was not ordered. 09/09/2020: sinus bradycardia 54.  Recent Labs: No results found for requested labs within last 8760 hours.   Recent Lipid Panel    Component Value Date/Time   CHOL 205 (H) 03/10/2020 1006   TRIG 89 03/10/2020 1006   HDL 60 03/10/2020 1006   CHOLHDL 3.4 03/10/2020 1006   CHOLHDL 2.6 12/24/2015 0845    VLDL 16 12/24/2015 0845   LDLCALC 129 (H) 03/10/2020 1006      Physical Exam:    VS:  BP 124/74    Pulse 60    Ht 5\' 3"  (1.6 m)    Wt 152 lb 12.8 oz (69.3 kg)    BMI 27.07 kg/m     Wt Readings from Last 3 Encounters:  05/29/21 152 lb 12.8 oz (69.3 kg)  09/09/20 155 lb 12.8 oz (70.7 kg)  03/10/20 154 lb 9.6 oz (70.1 kg)     GEN:  Well nourished, well developed in no acute distress HEENT: Normal NECK: No JVD; No carotid bruits CARDIAC: RRR, no murmurs RESPIRATORY:  Clear to auscultation without rales, wheezing or rhonchi  ABDOMEN: Soft, non-tender, non-distended MUSCULOSKELETAL:  No edema; No deformity  SKIN: Warm and dry NEUROLOGIC:  Alert and oriented x 3 PSYCHIATRIC:  Normal affect   ASSESSMENT:    1. Essential hypertension   2. Mixed hyperlipidemia   3. Chronic systolic CHF (congestive heart failure), NYHA class 2 ([place])   4. Nonischemic congestive cardiomyopathy ([place])   5. Medication management    PLAN:    In order of problems listed above:  #Chronic combined diastolic and systolic heart failure with improved LVEF 45-50%: #Non-ischemic CM: Possible viral etiology vs hereditary. EF as low as 30-35% now improved back to 45-50% on TTE 11/2018. No HF symptoms and patient is NYHA class I-II. -Continue coreg 6.25mg  BID -Continue eplerenone 25mg  daily -Continue benicar (olmesartan) 20mg  daily -Declined farxiga/jardiance -Low Na diet -Check BMET today  #Exercise induced VT: Noted on ETT. Repeat stress showed improvement with initiation of coreg. -Continue coreg 6.25mg  BID  #HTN: Controlled at home running 120s. -Continue coreg 6.25mg  BID -Continue eplerenone 25mg  daily -Continue benicar (olmesartan) 20mg  daily  #HLD: Not on statins. Ca score 0 in 2020. Prefers dietary management -Continue diet and lifestyle modifications -Repeat cholesterol  Follow-up in 6 months.  Medication Adjustments/Labs and Tests Ordered: Current medicines are reviewed at length with  the patient today.  Concerns regarding medicines are outlined above.   Orders Placed This Encounter  Procedures   Lipid Profile   Basic metabolic panel   CBC   TSH   No orders of the defined types were placed in this encounter.  Patient Instructions  Medication Instructions:   Your physician recommends that you continue  on your current medications as directed. Please refer to the Current Medication list given to you today.  *If you need a refill on your cardiac medications before your next appointment, please call your pharmacy*   Lab Work:  TODAY--BMET, CBC, TSH, AND LIPIDS  If you have labs (blood work) drawn today and your tests are completely normal, you will receive your results only by: [place] (if you have MyChart) OR A paper copy in the mail If you have any lab test that is abnormal or we need to change your treatment, we will call you to review the results.   Follow-Up: At [facility], you and your health needs are our priority.  As part of our continuing mission to provide you with exceptional heart care, we have created designated Provider Care Teams.  These Care Teams include your primary Cardiologist (physician) and Advanced Practice Providers (APPs -  Physician Assistants and Nurse Practitioners) who all work together to provide you with the care you need, when you need it.  We recommend signing up for the patient portal called "MyChart".  Sign up information is provided on this After Visit Summary.  MyChart is used to connect with patients for Virtual Visits (Telemedicine).  Patients are able to view lab/test results, encounter notes, upcoming appointments, etc.  Non-urgent messages can be sent to your provider as well.   To learn more about what you can do with MyChart, go to [email or web address].    Your next appointment:   6 month(s)  The format for your next appointment:   In Person  Provider:   DR. Delmar Landau Stumpf,acting as a  scribe for Freada Bergeron, MD.,have documented all relevant documentation on the behalf of Freada Bergeron, MD,as directed by  Freada Bergeron, MD while in the presence of Freada Bergeron, MD.  I, Freada Bergeron, MD, have reviewed all documentation for this visit. The documentation on 05/29/21 for the exam, diagnosis, procedures, and orders are all accurate and complete.    Signed, Freada Bergeron, MD  05/29/2021 8:50 AM    [facility]

## 2021-05-29 NOTE — Patient Instructions (Signed)
Medication Instructions:   Your physician recommends that you continue on your current medications as directed. Please refer to the Current Medication list given to you today.  *If you need a refill on your cardiac medications before your next appointment, please call your pharmacy*   Lab Work:  TODAY--BMET, CBC, TSH, AND LIPIDS  If you have labs (blood work) drawn today and your tests are completely normal, you will receive your results only by: MyChart Message (if you have MyChart) OR A paper copy in the mail If you have any lab test that is abnormal or we need to change your treatment, we will call you to review the results.   Follow-Up: At [facility], you and your health needs are our priority.  As part of our continuing mission to provide you with exceptional heart care, we have created designated Provider Care Teams.  These Care Teams include your primary Cardiologist (physician) and Advanced Practice Providers (APPs -  Physician Assistants and Nurse Practitioners) who all work together to provide you with the care you need, when you need it.  We recommend signing up for the patient portal called "MyChart".  Sign up information is provided on this After Visit Summary.  MyChart is used to connect with patients for Virtual Visits (Telemedicine).  Patients are able to view lab/test results, encounter notes, upcoming appointments, etc.  Non-urgent messages can be sent to your provider as well.   To learn more about what you can do with MyChart, go to [email or web address].    Your next appointment:   6 month(s)  The format for your next appointment:   In Person  Provider:   DR. Shari Prows

## 2021-06-01 ENCOUNTER — Telehealth: Payer: Self-pay | Admitting: *Deleted

## 2021-06-01 DIAGNOSIS — E875 Hyperkalemia: Secondary | ICD-10-CM

## 2021-06-01 DIAGNOSIS — Z79899 Other long term (current) drug therapy: Secondary | ICD-10-CM

## 2021-06-01 NOTE — Telephone Encounter (Signed)
-----   Message from Freada Bergeron, MD sent at 05/30/2021  7:56 AM EST ----- Her kidney function looks great. Her potassium is a little on the higher end. This may be related to the benicar, however, she has been on this for a long time. I would say let's try to cut back on some of the potassium rich foods and ensure she is hydrated and repeat testing in 2 weeks. Her LDL cholesterol remains elevated. Does she have any interest in starting cholesterol lowering therapy (if so, would do crestor 10mg  daily). If not, will continue lifestyle modifications. TSH is normal.

## 2021-06-01 NOTE — Telephone Encounter (Signed)
The patient has been notified of the result and verbalized understanding.  All questions (if any) were answered.  Pt aware to decrease the K in her diet and hydrate well with water.  She will come in for repeat BMET to recheck her K level on 06/15/21. She is also aware of her elevated LDL and she prefers trying to manage her lipids with healthy diet and exercise vs starting a statin at this time. Advised the pt that Dr. Johney Frame was okay with her continuing healthy lifestyle.  Did reiterate to her that she needs to cut foods high in potassium out of her diet and we will see her for lab on 3/6. Pt verbalized understanding and agrees with this plan.

## 2021-06-15 ENCOUNTER — Other Ambulatory Visit: Payer: Self-pay

## 2021-06-15 ENCOUNTER — Other Ambulatory Visit: Payer: Medicare Other | Admitting: *Deleted

## 2021-06-15 DIAGNOSIS — E875 Hyperkalemia: Secondary | ICD-10-CM

## 2021-06-15 DIAGNOSIS — Z79899 Other long term (current) drug therapy: Secondary | ICD-10-CM

## 2021-06-15 LAB — BASIC METABOLIC PANEL
BUN/Creatinine Ratio: 18 (ref 12–28)
BUN: 19 mg/dL (ref 8–27)
CO2: 25 mmol/L (ref 20–29)
Calcium: 9.1 mg/dL (ref 8.7–10.3)
Chloride: 107 mmol/L — ABNORMAL HIGH (ref 96–106)
Creatinine, Ser: 1.06 mg/dL — ABNORMAL HIGH (ref 0.57–1.00)
Glucose: 93 mg/dL (ref 70–99)
Potassium: 4 mmol/L (ref 3.5–5.2)
Sodium: 142 mmol/L (ref 134–144)
eGFR: 58 mL/min/{1.73_m2} — ABNORMAL LOW (ref >59)

## 2021-06-16 ENCOUNTER — Telehealth: Payer: Self-pay | Admitting: *Deleted

## 2021-06-16 NOTE — Telephone Encounter (Signed)
Pt reaching out to get assistance from our Prior Auth Nurse, in regards to her Benicar.  ? ?Pt states her Benicar is more than likely due for another PA to be done. ?Pt states even after meeting her deductible and for a 3 month supply of this medication, it is running her around $400 for 90 day supply. ? ?Pt states a PA has been done for this in the past, which got the cost down to like $45 a month.  ? ?Pt is asking if our Prior Auth Nurse would be able to assist with this or submit an updated PA for her Benicar. She states she is still using [facility].  ? ?Informed the pt that I will route this message to our Prior Auth Nurse to further review, advise, and follow-up with the pt accordingly thereafter.  ?Pt verbalized understanding and agrees with this plan. ?

## 2021-06-17 NOTE — Telephone Encounter (Signed)
**Note De-Identified [name] Obfuscation** The pt is aware of the following: ?The pt has a Benicar PA on file as follows: ?Approved on Sep 01, 2020 ?CaseId:[id number];Status:Approved;Review Type:Prior Auth;Coverage Start Date:08/02/2020;Coverage End Date:09/01/2021 ? ?I attempted a Benicar PA anyway but received this message: ? ?An active PA is already on file with expiration date of 09/01/2021. Please wait to resubmit request within 60 days of that expiration date to obtain a PA renewal. ?Drug: Benicar 20MG  tablets ?Form: [employer] Electronic PA Form [phone] NCPDP) ? ?The pt states that she will send me a [facility] message as a reminder to attempt this Benicar PA again closer to the end of this month or early May (within 60 days of expiration of current PA). ? ?She did provide her most recent cards info as: BC[employer] SUPPLEMENT ?ID: [id number] ?BIN: [id number] ? ?We also discussed GoodRx as she can get a 30 day supply from [facility] for $17.11. ?with free discount. ? ?She thanked me for calling her to discuss. ?

## 2021-08-28 ENCOUNTER — Other Ambulatory Visit: Payer: Self-pay | Admitting: Gynecology

## 2021-08-28 DIAGNOSIS — R928 Other abnormal and inconclusive findings on diagnostic imaging of breast: Secondary | ICD-10-CM

## 2021-09-04 ENCOUNTER — Telehealth: Payer: Self-pay | Admitting: Cardiology

## 2021-09-04 NOTE — Telephone Encounter (Signed)
Will route this message to our Prior Auth Nurse Larita Fife Via, whom has assisted this pt with her Benicar in the past, and getting the PA done.    Larita Fife to follow-up with the pt about this.

## 2021-09-04 NOTE — Telephone Encounter (Signed)
Pt c/o medication issue:  1. Name of Medication: BENICAR 20 MG tablet  2. How are you currently taking this medication (dosage and times per day)? As prescribed  3. Are you having a reaction (difficulty breathing--STAT)? No   4. What is your medication issue? Patient is calling regarding the status of the PA for this medication. She is on her last tablet of this medication and her pharmacy is going to try to get her two more tablets to last her through the holiday. Please advise.

## 2021-09-08 NOTE — Telephone Encounter (Signed)
**Note De-Identified Canon Gola Obfuscation** I started a urgent Benicar PA through covermymeds and received the following message:  [name] Key: [id number] - PA Case ID: [id number] Outcome: Approved today Prior Auth;Coverage Start Date:08/09/2021;Coverage End Date:09/08/2022; Drug: Benicar 20MG  tablets Form [employer] Electronic PA Form (2017 NCPDP)  I have notified [facility] [phone] - [place], [place] - [address] (Ph: #[phone]) of this approval.

## 2021-09-11 ENCOUNTER — Ambulatory Visit
Admission: RE | Admit: 2021-09-11 | Discharge: 2021-09-11 | Disposition: A | Discharge status: Home / Self Care | Payer: Medicare Other | Source: Ambulatory Visit | Attending: Gynecology | Admitting: Gynecology

## 2021-09-11 DIAGNOSIS — R928 Other abnormal and inconclusive findings on diagnostic imaging of breast: Secondary | ICD-10-CM

## 2022-01-31 NOTE — Progress Notes (Unsigned)
Cardiology Office Note:    Date:  01/31/2022   ID:  [name], [name] Jan 25, 1956, MRN [id number]  PCP:  Mateo Flow, MD   [facility] Providers Cardiologist:  Ena Dawley, MD {   Referring MD: Mateo Flow, MD    History of Present Illness:    [name] is a 66 y.o. female with a hx of nonischemic cardiomyopathy with recovered EF and palpitations who was previously followed by Dr. Meda Coffee who now returns to clinic for follow-up.  Per review of the record, patient has hisotry of NICM with negative stress test with initial LVEF 30-35% that improved to 50-55% in 2016 but then went back down to 30-35% and then finally recovered back to 55-60% in 11/2018.  Seen in clinic on 08/2020 where she was doing well with no HF symptoms. TTE 09/2020 with LVEF 55-60%, G1DD, normal strain, no significant valve disease  Was last seen in clinic on 05/2021 where she was doing well from a CV standpoint.  Today, ***   Past Medical History:  Diagnosis Date   Esophageal reflux    Osteoporosis    Palpitations     Past Surgical History:  Procedure Laterality Date   VAGINAL HYSTERECTOMY      Current Medications: No outpatient medications have been marked as taking for the 02/02/22 encounter (Appointment) with Freada Bergeron, MD.     Allergies:   Cefdinir, Entresto [sacubitril-valsartan], and Aldactone [spironolactone]   Social History   Socioeconomic History   Marital status: Married    Spouse name: Not on file   Number of children: Not on file   Years of education: Not on file   Highest education level: Not on file  Occupational History   Not on file  Tobacco Use   Smoking status: Never   Smokeless tobacco: Never  Substance and Sexual Activity   Alcohol use: No   Drug use: No   Sexual activity: Not on file  Other Topics Concern   Not on file  Social History Narrative   Not on file   Social Determinants of Health   Financial Resource Strain: Not on  file  Food Insecurity: Not on file  Transportation Needs: Not on file  Physical Activity: Not on file  Stress: Not on file  Social Connections: Not on file     Family History: [name] family history includes CAD in an other family member; Diabetes in her maternal uncle; Heart attack (age of onset: 64) in her mother. There is no history of Thyroid disease or Stroke.  ROS:   Please see the history of present illness.    Review of Systems  Constitutional:  Negative for chills and fever.  HENT:  Negative for sore throat.   Eyes:  Negative for blurred vision.  Respiratory:  Negative for shortness of breath.   Cardiovascular:  Positive for palpitations. Negative for chest pain, orthopnea, claudication, leg swelling and PND.  Gastrointestinal:  Negative for nausea and vomiting.  Genitourinary:  Negative for dysuria.  Musculoskeletal:  Negative for falls.  Neurological:  Negative for dizziness and loss of consciousness.  Psychiatric/Behavioral:  Negative for substance abuse.     EKGs/Labs/Other Studies Reviewed:    The following studies were reviewed today:  TTE 09/2020: IMPRESSIONS    1. Left ventricular ejection fraction, by estimation, is 55 to 60%. The  left ventricle has normal function. The left ventricle has no regional  wall motion abnormalities. Left ventricular diastolic parameters are  consistent  with Grade I diastolic  dysfunction (impaired relaxation). The average left ventricular global  longitudinal strain is -21.5 %. The global longitudinal strain is normal.   2. Right ventricular systolic function is normal. The right ventricular  size is normal. Tricuspid regurgitation signal is inadequate for assessing  PA pressure.   3. The mitral valve is grossly normal. No evidence of mitral valve  regurgitation. No evidence of mitral stenosis.   4. The aortic valve is tricuspid. Aortic valve regurgitation is trivial.  No aortic stenosis is present.   5. The inferior  vena cava is normal in size with greater than 50%  respiratory variability, suggesting right atrial pressure of 3 mmHg.   ETT 08/2019: Blood pressure demonstrated a normal response to exercise. There was no ST segment deviation noted during stress.   ETT with fair exercise tolerance (6:19); no chest pain; normal blood pressure response; no diagnostic ST changes; study terminated due to episode of 6 beats nonsustained, polymorphic ventricular tachycardia; patient achieved 83% target heart rate; negative inadequate exercise treadmill.  TTE 12/07/19: IMPRESSIONS   1. The average left ventricular global longitudinal strain is -17.9 %.   2. The left ventricle has normal systolic function, with an ejection  fraction of 55-60%. The cavity size was normal. Left ventricular diastolic  parameters were normal.   3. The right ventricle has normal systolic function. The cavity was  normal. There is no increase in right ventricular wall thickness. Right  ventricular systolic pressure is normal with an estimated pressure of 23.2  mmHg.   4. The aortic valve is tricuspid. Mild sclerosis of the aortic valve.  Aortic valve regurgitation is trivial by color flow Doppler.   5. The aorta is normal unless otherwise noted.  Cardiac monitor 10/2018: Duration: 13d   Findings VT nonsustained x 3; longest 9 beats accelerating from 104 t 140 ; fastest   @ 150 X 4 b  Assoc with Symptoms  SVT nonsustained  Up to 6 beats @ 200 bpm   Assoc with symptoms    Nocturnal bradycardia >> 45     Triggered Events-- as above   Her bradycardia makes these hard to treat; in and of themselves they are not life threatening-- if she decides to consider an ICD then the bradycardia would be obviated   For now I wll just follow  Coronary CT 08/22/2018: Aorta:  Normal size.  No calcifications.  No dissection.   Aortic Valve:  Trileaflet.  No calcifications.   Coronary Arteries:  Normal coronary origin.  Right dominance.    RCA is a large dominant artery that gives rise to PDA and PLVB. There is no plaque.   Left main is a short artery that gives rise to LAD and LCX arteries. Left main has no plaque.   LAD is a large vessel that gives rise to one diagonal artery. There is no plaque.   LCX is a non-dominant artery that gives rise to one large OM1 branch. There is no plaque.   Other findings:   Normal pulmonary vein drainage into the left atrium.   Normal let atrial appendage without a thrombus.   Normal size of the pulmonary artery.   IMPRESSION: 1. Coronary calcium score of 0. This was 0 percentile for age and sex matched control.   2. Normal coronary origin with right dominance.   3. No evidence of CAD.   EKG:  EKG is personally reviewed. 05/29/2021: EKG was not ordered. 09/09/2020: sinus bradycardia 54.  Recent Labs:  05/29/2021: Hemoglobin 11.7; Platelets 256; TSH 2.730 06/15/2021: BUN 19; Creatinine, Ser 1.06; Potassium 4.0; Sodium 142   Recent Lipid Panel    Component Value Date/Time   CHOL 221 (H) 05/29/2021 0854   TRIG 131 05/29/2021 0854   HDL 62 05/29/2021 0854   CHOLHDL 3.6 05/29/2021 0854   CHOLHDL 2.6 12/24/2015 0845   VLDL 16 12/24/2015 0845   LDLCALC 136 (H) 05/29/2021 0854      Physical Exam:    VS:  There were no vitals taken for this visit.    Wt Readings from Last 3 Encounters:  05/29/21 152 lb 12.8 oz (69.3 kg)  09/09/20 155 lb 12.8 oz (70.7 kg)  03/10/20 154 lb 9.6 oz (70.1 kg)     GEN:  Well nourished, well developed in no acute distress HEENT: Normal NECK: No JVD; No carotid bruits CARDIAC: RRR, no murmurs RESPIRATORY:  Clear to auscultation without rales, wheezing or rhonchi  ABDOMEN: Soft, non-tender, non-distended MUSCULOSKELETAL:  No edema; No deformity  SKIN: Warm and dry NEUROLOGIC:  Alert and oriented x 3 PSYCHIATRIC:  Normal affect   ASSESSMENT:    No diagnosis found.  PLAN:    In order of problems listed above:  #Chronic combined  diastolic and systolic heart failure with improved LVEF 45-50%: #Non-ischemic CM: Possible viral etiology vs hereditary. EF as low as 30-35% now improved back to 45-50% on TTE 11/2018. No HF symptoms and patient is NYHA class I-II. -Continue coreg 6.25mg  BID -Continue eplerenone 25mg  daily -Continue benicar (olmesartan) 20mg  daily -Declined farxiga/jardiance -Low Na diet  #Exercise induced VT: Noted on ETT. Repeat stress showed improvement with initiation of coreg. -Continue coreg 6.25mg  BID  #HTN: Controlled at home running 120s. -Continue coreg 6.25mg  BID -Continue eplerenone 25mg  daily -Continue benicar (olmesartan) 20mg  daily  #HLD: Not on statins. Ca score 0 in 2020. Prefers dietary management -Continue diet and lifestyle modifications  Follow-up in 6 months.  Medication Adjustments/Labs and Tests Ordered: Current medicines are reviewed at length with the patient today.  Concerns regarding medicines are outlined above.   No orders of the defined types were placed in this encounter.  No orders of the defined types were placed in this encounter.  There are no Patient Instructions on file for this visit.   I,Mathew Stumpf,acting as a for , MD.,have documented all relevant documentation on the behalf of , MD,as directed by  2021, MD while in the presence of Neurosurgeon, MD.  I, Meriam Sprague, MD, have reviewed all documentation for this visit. The documentation on 01/31/22 for the exam, diagnosis, procedures, and orders are all accurate and complete.    Signed, Meriam Sprague, MD  01/31/2022 8:00 PM    [facility]

## 2022-02-02 ENCOUNTER — Ambulatory Visit: Payer: Medicare Other | Attending: Cardiology | Admitting: Cardiology

## 2022-02-02 ENCOUNTER — Encounter: Payer: Self-pay | Admitting: Cardiology

## 2022-02-02 VITALS — BP 104/60 | HR 52 | Ht 63.0 in | Wt 158.8 lb

## 2022-02-02 DIAGNOSIS — Z79899 Other long term (current) drug therapy: Secondary | ICD-10-CM | POA: Diagnosis present

## 2022-02-02 DIAGNOSIS — I10 Essential (primary) hypertension: Secondary | ICD-10-CM | POA: Diagnosis present

## 2022-02-02 DIAGNOSIS — I420 Dilated cardiomyopathy: Secondary | ICD-10-CM | POA: Insufficient documentation

## 2022-02-02 DIAGNOSIS — I4729 Other ventricular tachycardia: Secondary | ICD-10-CM | POA: Insufficient documentation

## 2022-02-02 DIAGNOSIS — E782 Mixed hyperlipidemia: Secondary | ICD-10-CM | POA: Insufficient documentation

## 2022-02-02 DIAGNOSIS — I472 Ventricular tachycardia, unspecified: Secondary | ICD-10-CM | POA: Insufficient documentation

## 2022-02-02 DIAGNOSIS — I5022 Chronic systolic (congestive) heart failure: Secondary | ICD-10-CM | POA: Diagnosis present

## 2022-02-02 NOTE — Progress Notes (Signed)
Cardiology Office Note:    Date:  02/02/2022   ID:  Bridget, [name] June 12, 1955, MRN [id number]  PCP:  Mateo Flow, MD   [facility] Providers Cardiologist:  Ena Dawley, MD {   Referring MD: Mateo Flow, MD    History of Present Illness:    [name] is a 66 y.o. female with a hx of nonischemic cardiomyopathy with recovered EF and palpitations who was previously followed by Dr. Meda Coffee who now returns to clinic for follow-up.  Per review of the record, patient has hisotry of NICM with negative stress test with initial LVEF 30-35% that improved to 50-55% in 2016 but then went back down to 30-35% and then finally recovered back to 55-60% in 11/2018.  Seen in clinic on 08/2020 where she was doing well with no HF symptoms. TTE 09/2020 with LVEF 55-60%, G1DD, normal strain, no significant valve disease  Was last seen in clinic on 05/2021 where she was doing well from a CV standpoint.  Today, she is doing very well. She walked her first 5K back in August without stopping. She did fine throughout and did not have any problems. She is tolerating her medication well. She is having some issues with filling the Benicar. She has been feeling so much better since taking it but she is having to pay at least $400 for a 90 day supply. She is hesitant to change due to how much better she is feeling since starting it.   She denies any palpitations, chest pain, shortness of breath, or peripheral edema. No lightheadedness, headaches, syncope, orthopnea, or PND.   Past Medical History:  Diagnosis Date   Esophageal reflux    Osteoporosis    Palpitations     Past Surgical History:  Procedure Laterality Date   VAGINAL HYSTERECTOMY      Current Medications: Current Meds  Medication Sig   BENICAR 20 MG tablet Take 1 tablet (20 mg total) by mouth daily.   carvedilol (COREG) 6.25 MG tablet TAKE 1 TABLET(6.25 MG) BY MOUTH TWICE DAILY   eplerenone (INSPRA) 25 MG tablet Take 1  tablet (25 mg total) by mouth daily.   estradiol (ESTRACE) 0.1 MG/GM vaginal cream Place 1 Applicatorful vaginally at bedtime.     Allergies:   Cefdinir, Entresto [sacubitril-valsartan], and Aldactone [spironolactone]   Social History   Socioeconomic History   Marital status: Married    Spouse name: Not on file   Number of children: Not on file   Years of education: Not on file   Highest education level: Not on file  Occupational History   Not on file  Tobacco Use   Smoking status: Never   Smokeless tobacco: Never  Substance and Sexual Activity   Alcohol use: No   Drug use: No   Sexual activity: Not on file  Other Topics Concern   Not on file  Social History Narrative   Not on file   Social Determinants of Health   Financial Resource Strain: Not on file  Food Insecurity: Not on file  Transportation Needs: Not on file  Physical Activity: Not on file  Stress: Not on file  Social Connections: Not on file     Family History: [name] family history includes CAD in an other family member; Diabetes in her maternal uncle; Heart attack (age of onset: 39) in her mother. There is no history of Thyroid disease or Stroke.  ROS:   Please see the history of present illness.  Review of Systems  Constitutional:  Negative for chills and fever.  HENT:  Negative for sore throat.   Eyes:  Negative for blurred vision.  Respiratory:  Negative for shortness of breath.   Cardiovascular:  Negative for chest pain, palpitations, orthopnea, claudication, leg swelling and PND.  Gastrointestinal:  Negative for nausea and vomiting.  Genitourinary:  Negative for dysuria.  Musculoskeletal:  Negative for falls.  Neurological:  Negative for dizziness and loss of consciousness.  Psychiatric/Behavioral:  Negative for substance abuse.     EKGs/Labs/Other Studies Reviewed:    The following studies were reviewed today:  TTE 09/2020: IMPRESSIONS    1. Left ventricular ejection fraction,  by estimation, is 55 to 60%. The  left ventricle has normal function. The left ventricle has no regional  wall motion abnormalities. Left ventricular diastolic parameters are  consistent with Grade I diastolic  dysfunction (impaired relaxation). The average left ventricular global  longitudinal strain is -21.5 %. The global longitudinal strain is normal.   2. Right ventricular systolic function is normal. The right ventricular  size is normal. Tricuspid regurgitation signal is inadequate for assessing  PA pressure.   3. The mitral valve is grossly normal. No evidence of mitral valve  regurgitation. No evidence of mitral stenosis.   4. The aortic valve is tricuspid. Aortic valve regurgitation is trivial.  No aortic stenosis is present.   5. The inferior vena cava is normal in size with greater than 50%  respiratory variability, suggesting right atrial pressure of 3 mmHg.   ETT 08/2019: Blood pressure demonstrated a normal response to exercise. There was no ST segment deviation noted during stress.   ETT with fair exercise tolerance (6:19); no chest pain; normal blood pressure response; no diagnostic ST changes; study terminated due to episode of 6 beats nonsustained, polymorphic ventricular tachycardia; patient achieved 83% target heart rate; negative inadequate exercise treadmill.  TTE 12/07/19: IMPRESSIONS   1. The average left ventricular global longitudinal strain is -17.9 %.   2. The left ventricle has normal systolic function, with an ejection  fraction of 55-60%. The cavity size was normal. Left ventricular diastolic  parameters were normal.   3. The right ventricle has normal systolic function. The cavity was  normal. There is no increase in right ventricular wall thickness. Right  ventricular systolic pressure is normal with an estimated pressure of 23.2  mmHg.   4. The aortic valve is tricuspid. Mild sclerosis of the aortic valve.  Aortic valve regurgitation is trivial by  color flow Doppler.   5. The aorta is normal unless otherwise noted.  Cardiac monitor 10/2018: Duration: 13d   Findings VT nonsustained x 3; longest 9 beats accelerating from 104 t 140 ; fastest   @ 150 X 4 b  Assoc with Symptoms  SVT nonsustained  Up to 6 beats @ 200 bpm   Assoc with symptoms    Nocturnal bradycardia >> 45     Triggered Events-- as above   Her bradycardia makes these hard to treat; in and of themselves they are not life threatening-- if she decides to consider an ICD then the bradycardia would be obviated   For now I wll just follow  Coronary CT 08/22/2018: Aorta:  Normal size.  No calcifications.  No dissection.   Aortic Valve:  Trileaflet.  No calcifications.   Coronary Arteries:  Normal coronary origin.  Right dominance.   RCA is a large dominant artery that gives rise to PDA and PLVB. There is no plaque.  Left main is a short artery that gives rise to LAD and LCX arteries. Left main has no plaque.   LAD is a large vessel that gives rise to one diagonal artery. There is no plaque.   LCX is a non-dominant artery that gives rise to one large OM1 branch. There is no plaque.   Other findings:   Normal pulmonary vein drainage into the left atrium.   Normal let atrial appendage without a thrombus.   Normal size of the pulmonary artery.   IMPRESSION: 1. Coronary calcium score of 0. This was 0 percentile for age and sex matched control.   2. Normal coronary origin with right dominance.   3. No evidence of CAD.   EKG:  EKG is personally reviewed. 02/01/2022: Sinus Bradycardia, HR 52 bpm.  05/29/2021: EKG was not ordered. 09/09/2020: sinus bradycardia 54.  Recent Labs: 05/29/2021: Hemoglobin 11.7; Platelets 256; TSH 2.730 06/15/2021: BUN 19; Creatinine, Ser 1.06; Potassium 4.0; Sodium 142   Recent Lipid Panel    Component Value Date/Time   CHOL 221 (H) 05/29/2021 0854   TRIG 131 05/29/2021 0854   HDL 62 05/29/2021 0854   CHOLHDL 3.6  05/29/2021 0854   CHOLHDL 2.6 12/24/2015 0845   VLDL 16 12/24/2015 0845   LDLCALC 136 (H) 05/29/2021 0854      Physical Exam:    VS:  BP 104/60   Pulse (!) 52   Ht _0  (1.6 m)   Wt 158 lb 12.8 oz (72 kg)   SpO2 96%   BMI 28.13 kg/m     Wt Readings from Last 3 Encounters:  02/02/22 158 lb 12.8 oz (72 kg)  05/29/21 152 lb 12.8 oz (69.3 kg)  09/09/20 155 lb 12.8 oz (70.7 kg)     GEN:  Well nourished, well developed in no acute distress HEENT: Normal NECK: No JVD; No carotid bruits CARDIAC: RRR, no murmurs, rubs or gallops RESPIRATORY:  Clear to auscultation without rales, wheezing or rhonchi  ABDOMEN: Soft, non-tender, non-distended MUSCULOSKELETAL:  No edema; No deformity  SKIN: Warm and dry NEUROLOGIC:  Alert and oriented x 3 PSYCHIATRIC:  Normal affect   ASSESSMENT:    1. Chronic systolic CHF (congestive heart failure), NYHA class 2 ([place])   2. Medication management   3. Essential hypertension   4. Mixed hyperlipidemia   5. Nonischemic congestive cardiomyopathy ([place])   6. Ventricular tachycardia ([place])   7. NSVT (nonsustained ventricular tachycardia) ([facility])    PLAN:    In order of problems listed above:  #Chronic combined diastolic and systolic heart failure with improved LVEF 45-50%: #Non-ischemic CM: Possible viral etiology vs hereditary. EF as low as 30-35% now improved back to 45-50% on TTE 11/2018. No HF symptoms and patient is NYHA class I-II. -Continue coreg 6.74m BID -Continue eplerenone 235mdaily -Continue benicar (olmesartan) 2051maily -Declined farxiga/jardiance -Low Na diet  #Exercise induced VT: Noted on ETT. Repeat stress showed improvement with initiation of coreg. -Continue coreg 6.41m29mD  #HTN: Controlled at home running 100-120s. -Continue coreg 6.41mg94m -Continue eplerenone 41mg 41my -Continue benicar (olmesartan) 20mg d18m  #HLD: Not on statins. Ca score 0 in 2020. Prefers dietary management -Continue diet and lifestyle  modifications  Follow-up in 6 months  Medication Adjustments/Labs and Tests Ordered: Current medicines are reviewed at length with the patient today.  Concerns regarding medicines are outlined above.   Orders Placed This Encounter  Procedures   Comp Met (CMET)   CBC w/Diff   TSH   Lipid  Profile   EKG 12-Lead   No orders of the defined types were placed in this encounter.  Patient Instructions  Medication Instructions:   Your physician recommends that you continue on your current medications as directed. Please refer to the Current Medication list given to you today.  *If you need a refill on your cardiac medications before your next appointment, please call your pharmacy*   Lab Work:  ON 02/15/22--CMET, CBC W DIFF, TSH, AND LIPIDS--PLEASE COME FASTING TO THIS LAB APPOINTMENT   If you have labs (blood work) drawn today and your tests are completely normal, you will receive your results only by: [place] (if you have MyChart) OR A paper copy in the mail If you have any lab test that is abnormal or we need to change your treatment, we will call you to review the results.   Follow-Up: At [facility], you and your health needs are our priority.  As part of our continuing mission to provide you with exceptional heart care, we have created designated Provider Care Teams.  These Care Teams include your primary Cardiologist (physician) and Advanced Practice Providers (APPs -  Physician Assistants and Nurse Practitioners) who all work together to provide you with the care you need, when you need it.  We recommend signing up for the patient portal called "MyChart".  Sign up information is provided on this After Visit Summary.  MyChart is used to connect with patients for Virtual Visits (Telemedicine).  Patients are able to view lab/test results, encounter notes, upcoming appointments, etc.  Non-urgent messages can be sent to your provider as well.   To learn more about  what you can do with MyChart, go to [email or web address].    Your next appointment:   6 month(s)  The format for your next appointment:   In Person  Provider:   DR. Johney Frame OR AN EXTENDER  Important Information About Sugar         Burnett Kanaris Ford,acting as a scribe for Freada Bergeron, MD.,have documented all relevant documentation on the behalf of Freada Bergeron, MD,as directed by  Freada Bergeron, MD while in the presence of Freada Bergeron, MD.   I, Freada Bergeron, MD, have reviewed all documentation for this visit. The documentation on 02/02/22 for the exam, diagnosis, procedures, and orders are all accurate and complete.    Signed, Freada Bergeron, MD  02/02/2022 2:57 PM    [facility]

## 2022-02-02 NOTE — Patient Instructions (Signed)
Medication Instructions:   Your physician recommends that you continue on your current medications as directed. Please refer to the Current Medication list given to you today.  *If you need a refill on your cardiac medications before your next appointment, please call your pharmacy*   Lab Work:  ON 02/15/22--CMET, CBC W DIFF, TSH, AND LIPIDS--PLEASE COME FASTING TO THIS LAB APPOINTMENT   If you have labs (blood work) drawn today and your tests are completely normal, you will receive your results only by: MyChart Message (if you have MyChart) OR A paper copy in the mail If you have any lab test that is abnormal or we need to change your treatment, we will call you to review the results.   Follow-Up: At [facility], you and your health needs are our priority.  As part of our continuing mission to provide you with exceptional heart care, we have created designated Provider Care Teams.  These Care Teams include your primary Cardiologist (physician) and Advanced Practice Providers (APPs -  Physician Assistants and Nurse Practitioners) who all work together to provide you with the care you need, when you need it.  We recommend signing up for the patient portal called "MyChart".  Sign up information is provided on this After Visit Summary.  MyChart is used to connect with patients for Virtual Visits (Telemedicine).  Patients are able to view lab/test results, encounter notes, upcoming appointments, etc.  Non-urgent messages can be sent to your provider as well.   To learn more about what you can do with MyChart, go to [email or web address].    Your next appointment:   6 month(s)  The format for your next appointment:   In Person  Provider:   DR. [place] OR AN EXTENDER  Important Information About Sugar       

## 2022-02-15 ENCOUNTER — Ambulatory Visit: Payer: Medicare Other | Attending: Cardiology

## 2022-02-15 DIAGNOSIS — I420 Dilated cardiomyopathy: Secondary | ICD-10-CM

## 2022-02-15 DIAGNOSIS — I10 Essential (primary) hypertension: Secondary | ICD-10-CM

## 2022-02-15 DIAGNOSIS — E782 Mixed hyperlipidemia: Secondary | ICD-10-CM

## 2022-02-15 DIAGNOSIS — Z79899 Other long term (current) drug therapy: Secondary | ICD-10-CM

## 2022-02-16 LAB — CBC WITH DIFFERENTIAL/PLATELET
Basophils Absolute: 0 10*3/uL (ref 0.0–0.2)
Basos: 0 %
EOS (ABSOLUTE): 0.1 10*3/uL (ref 0.0–0.4)
Eos: 1 %
Hematocrit: 35.9 % (ref 34.0–46.6)
Hemoglobin: 12.1 g/dL (ref 11.1–15.9)
Immature Grans (Abs): 0 10*3/uL (ref 0.0–0.1)
Immature Granulocytes: 0 %
Lymphocytes Absolute: 1.8 10*3/uL (ref 0.7–3.1)
Lymphs: 26 %
MCH: 32.2 pg (ref 26.6–33.0)
MCHC: 33.7 g/dL (ref 31.5–35.7)
MCV: 96 fL (ref 79–97)
Monocytes Absolute: 0.3 10*3/uL (ref 0.1–0.9)
Monocytes: 5 %
Neutrophils Absolute: 4.6 10*3/uL (ref 1.4–7.0)
Neutrophils: 68 %
Platelets: 265 10*3/uL (ref 150–450)
RBC: 3.76 x10E6/uL — ABNORMAL LOW (ref 3.77–5.28)
RDW: 11.9 % (ref 11.7–15.4)
WBC: 6.8 10*3/uL (ref 3.4–10.8)

## 2022-02-16 LAB — LIPID PANEL
Chol/HDL Ratio: 3 ratio (ref 0.0–4.4)
Cholesterol, Total: 216 mg/dL — ABNORMAL HIGH (ref 100–199)
HDL: 73 mg/dL (ref >39)
LDL Chol Calc (NIH): 126 mg/dL — ABNORMAL HIGH (ref 0–99)
Triglycerides: 95 mg/dL (ref 0–149)
VLDL Cholesterol Cal: 17 mg/dL (ref 5–40)

## 2022-02-16 LAB — COMPREHENSIVE METABOLIC PANEL
ALT: 9 IU/L (ref 0–32)
AST: 16 IU/L (ref 0–40)
Albumin/Globulin Ratio: 2 (ref 1.2–2.2)
Albumin: 4.3 g/dL (ref 3.9–4.9)
Alkaline Phosphatase: 56 IU/L (ref 44–121)
BUN/Creatinine Ratio: 19 (ref 12–28)
BUN: 19 mg/dL (ref 8–27)
Bilirubin Total: 0.8 mg/dL (ref 0.0–1.2)
CO2: 26 mmol/L (ref 20–29)
Calcium: 9.6 mg/dL (ref 8.7–10.3)
Chloride: 106 mmol/L (ref 96–106)
Creatinine, Ser: 0.98 mg/dL (ref 0.57–1.00)
Globulin, Total: 2.2 g/dL (ref 1.5–4.5)
Glucose: 96 mg/dL (ref 70–99)
Potassium: 5.3 mmol/L — ABNORMAL HIGH (ref 3.5–5.2)
Sodium: 142 mmol/L (ref 134–144)
Total Protein: 6.5 g/dL (ref 6.0–8.5)
eGFR: 63 mL/min/{1.73_m2} (ref >59)

## 2022-02-16 LAB — TSH: TSH: 1.97 u[IU]/mL (ref 0.450–4.500)

## 2022-05-14 ENCOUNTER — Other Ambulatory Visit: Payer: Self-pay

## 2022-05-14 MED ORDER — BENICAR 20 MG PO TABS: 20.0000 mg | ORAL_TABLET | Freq: Every day | ORAL | 2 refills | Status: DC

## 2022-05-31 ENCOUNTER — Telehealth: Payer: Self-pay | Admitting: Cardiology

## 2022-05-31 MED ORDER — CARVEDILOL 6.25 MG PO TABS: ORAL_TABLET | ORAL | 2 refills | Status: DC

## 2022-05-31 MED ORDER — EPLERENONE 25 MG PO TABS: 25.0000 mg | ORAL_TABLET | Freq: Every day | ORAL | 2 refills | Status: DC

## 2022-05-31 NOTE — Telephone Encounter (Signed)
Pt's medications were sent to pt's pharmacy as requested. Confirmation received.  

## 2022-05-31 NOTE — Telephone Encounter (Signed)
*  STAT* If patient is at the pharmacy, call can be transferred to refill team.   1. Which medications need to be refilled? (please list name of each medication and dose if known)   eplerenone (INSPRA) 25 MG tablet (1 week left) carvedilol (COREG) 6.25 MG tablet (completely out)   2. Which pharmacy/location (including street and city if local pharmacy) is medication to be sent to?  [facility] [phone] - [place], [address] AT [place]   3. Do they need a 30 day or 90 day supply?   90 day  Patient stated she is almost out of these medications.

## 2022-09-10 ENCOUNTER — Other Ambulatory Visit: Payer: Self-pay | Admitting: Gynecology

## 2022-09-10 DIAGNOSIS — R928 Other abnormal and inconclusive findings on diagnostic imaging of breast: Secondary | ICD-10-CM

## 2022-10-21 ENCOUNTER — Ambulatory Visit
Admission: RE | Admit: 2022-10-21 | Discharge: 2022-10-21 | Disposition: A | Discharge status: Home / Self Care | Payer: Medicare Other | Source: Ambulatory Visit | Attending: Gynecology | Admitting: Gynecology

## 2022-10-21 DIAGNOSIS — R928 Other abnormal and inconclusive findings on diagnostic imaging of breast: Secondary | ICD-10-CM

## 2022-10-25 ENCOUNTER — Other Ambulatory Visit (HOSPITAL_COMMUNITY): Payer: Self-pay

## 2022-10-26 ENCOUNTER — Telehealth: Payer: Self-pay

## 2022-10-26 NOTE — Telephone Encounter (Signed)
Pharmacy Patient Advocate Encounter  Received notification from [facility] that Prior Authorization for [facility] has been APPROVED THRU 04/12/23.Marland Kitchen  PA #/Case ID/Reference #:  [id number]

## 2022-10-28 ENCOUNTER — Telehealth: Payer: Self-pay | Admitting: Cardiology

## 2022-10-28 ENCOUNTER — Other Ambulatory Visit: Payer: Self-pay | Admitting: Gynecology

## 2022-10-28 DIAGNOSIS — N6489 Other specified disorders of breast: Secondary | ICD-10-CM

## 2022-10-28 NOTE — Telephone Encounter (Signed)
Patient is asking that the nurse to give her a called back. Concerning her an appt and to discuss the medication. Please advise

## 2022-10-28 NOTE — Telephone Encounter (Signed)
Pt called and we spoke for about 20 min... to report that she has an upcoming appt with Dr Shari Prows 11/18/22 as does her husband... she is asking where to go to next.   She also wants Dr Jacquelyne Balint advice re: her meds... she says she did not take them for about 2 weeks.... Benicar and Coreg due to a lot going on with a bathroom remodel and other things... she says she felt very well but did not monitor her BP.  She is asking if since she felt so well can she stop them. I advised her that with her history there is great cardiac benefit to taking the Coreg and  keeping her BP managed.   I also advised that would be good to talk about her meds at her next OV and when getting established with her new cardiologist and she agreed.

## 2022-10-29 ENCOUNTER — Encounter (HOSPITAL_COMMUNITY): Payer: Self-pay

## 2022-10-29 ENCOUNTER — Other Ambulatory Visit: Payer: Self-pay

## 2022-10-29 ENCOUNTER — Emergency Department (HOSPITAL_COMMUNITY): Payer: Medicare Other

## 2022-10-29 ENCOUNTER — Emergency Department (HOSPITAL_COMMUNITY)
Admission: EM | Admit: 2022-10-29 | Discharge: 2022-10-30 | Disposition: A | Discharge status: Home / Self Care | Payer: Medicare Other | Attending: Emergency Medicine | Admitting: Emergency Medicine

## 2022-10-29 DIAGNOSIS — W010XXA Fall on same level from slipping, tripping and stumbling without subsequent striking against object, initial encounter: Secondary | ICD-10-CM | POA: Insufficient documentation

## 2022-10-29 DIAGNOSIS — S82842A Displaced bimalleolar fracture of left lower leg, initial encounter for closed fracture: Secondary | ICD-10-CM | POA: Diagnosis not present

## 2022-10-29 DIAGNOSIS — I110 Hypertensive heart disease with heart failure: Secondary | ICD-10-CM | POA: Insufficient documentation

## 2022-10-29 DIAGNOSIS — W19XXXA Unspecified fall, initial encounter: Secondary | ICD-10-CM

## 2022-10-29 DIAGNOSIS — I5022 Chronic systolic (congestive) heart failure: Secondary | ICD-10-CM | POA: Diagnosis not present

## 2022-10-29 DIAGNOSIS — S99912A Unspecified injury of left ankle, initial encounter: Secondary | ICD-10-CM | POA: Diagnosis present

## 2022-10-29 MED ORDER — FENTANYL CITRATE PF 50 MCG/ML IJ SOSY: 50.0000 ug | PREFILLED_SYRINGE | Freq: Once | INTRAMUSCULAR | Status: AC

## 2022-10-29 MED ORDER — KETOROLAC TROMETHAMINE 15 MG/ML IJ SOLN: 15.0000 mg | Freq: Once | INTRAMUSCULAR | Status: DC

## 2022-10-29 MED ORDER — FENTANYL CITRATE PF 50 MCG/ML IJ SOSY
PREFILLED_SYRINGE | INTRAMUSCULAR | Status: AC
  Administered 2022-10-29: 50 ug via INTRAVENOUS
  Filled 2022-10-29: qty 1

## 2022-10-29 MED ORDER — HYDROCODONE-ACETAMINOPHEN 5-325 MG PO TABS: 1.0000 | ORAL_TABLET | ORAL | 0 refills | Status: DC | PRN

## 2022-10-29 MED ORDER — FENTANYL CITRATE PF 50 MCG/ML IJ SOSY: 50.0000 ug | PREFILLED_SYRINGE | Freq: Once | INTRAMUSCULAR | Status: DC

## 2022-10-29 NOTE — ED Provider Triage Note (Signed)
Emergency Medicine Provider Triage Evaluation Note  SHAUNAE SIELOFF , a 67 y.o. female  was evaluated in triage.  Pt complains of left ankle pain.  Review of Systems  Positive:  Negative:   Physical Exam  BP (!) 146/70   Pulse 72   Temp 97.9 F (36.6 C)   Resp 16   Ht 5\' 3"  (1.6 m)   Wt 71.2 kg   SpO2 98%   BMI 27.81 kg/m  Gen:   Awake, no distress   Resp:  Normal effort  MSK:   Moves extremities without difficulty  Other:    Medical Decision Making  Medically screening exam initiated at 8:26 PM.  Appropriate orders placed.  ARDINE IACOVELLI was informed that the remainder of the evaluation will be completed by another provider, this initial triage assessment does not replace that evaluation, and the importance of remaining in the ED until their evaluation is complete.  Complains of left ankle pain after mechanical fall. Slipped on wet ground. Felt something pop and has not tried to bear weight on left foot.  Not on blood thinner. Denies head trauma, loss of consciousness, seizures, nausea, vomiting.   Denies chest pain, dyspnea, abdominal pain.   Valrie Hart F, [place] 10/29/22 2030

## 2022-10-29 NOTE — ED Notes (Signed)
Ortho at bedside.

## 2022-10-29 NOTE — ED Provider Notes (Signed)
[place] EMERGENCY DEPARTMENT AT [facility] Provider Note   CSN: [id number] Arrival date & time: 10/29/22  1912     History  Chief Complaint  Patient presents with   Leg Injury    Left foot    [name] is a 67 y.o. female.  Patient presents to the emergency room complaining of left ankle pain secondary to a fall.  Patient states she was walking outside and slipped in some mud, she remembers landing on her buttocks and is not sure how she hurt the actual left ankle.  She did not try to bear weight on the ankle and crawled back to her house.  Relatives transported to the hospital.  The patient denies any her head, denies loss of consciousness.  Denies blood thinner usage.  Past medical history significant for chronic systolic CHF, palpitations, hypertension, osteoporosis  HPI     Home Medications Prior to Admission medications   Medication Sig Start Date End Date Taking? Authorizing Provider  HYDROcodone-acetaminophen (NORCO/VICODIN) 5-325 MG tablet Take 1 tablet by mouth every 4 (four) hours as needed. 10/29/22  Yes Darrick Grinder, PA-C  BENICAR 20 MG tablet Take 1 tablet (20 mg total) by mouth daily. 05/14/22   Meriam Sprague, MD  carvedilol (COREG) 6.25 MG tablet TAKE 1 TABLET(6.25 MG) BY MOUTH TWICE DAILY 05/31/22   Meriam Sprague, MD  eplerenone (INSPRA) 25 MG tablet Take 1 tablet (25 mg total) by mouth daily. 05/31/22   Meriam Sprague, MD  estradiol (ESTRACE) 0.1 MG/GM vaginal cream Place 1 Applicatorful vaginally at bedtime.    [provider]      Allergies    Cefdinir, Entresto [sacubitril-valsartan], and Aldactone [spironolactone]    Review of Systems   Review of Systems  Physical Exam Updated Vital Signs BP (!) 146/70   Pulse 72   Temp 97.9 F (36.6 C)   Resp 16   Ht 5\' 3"  (1.6 m)   Wt 71.2 kg   SpO2 98%   BMI 27.81 kg/m  Physical Exam Vitals and nursing note reviewed.  HENT:     Head: Normocephalic and  atraumatic.  Eyes:     Pupils: Pupils are equal, round, and reactive to light.  Pulmonary:     Effort: Pulmonary effort is normal. No respiratory distress.  Musculoskeletal:        General: Swelling, tenderness and signs of injury present. No deformity. Normal range of motion.     Cervical back: Normal range of motion.     Comments: Patient able to flex and extend at left ankle, pain with attempted inversion, eversion.  Tenderness to palpation of both the medial and lateral malleolus.  Very mild swelling noted.  No obvious deformity.  Pedal pulse palpable.  Distal sensation intact.  Skin:    General: Skin is dry.  Neurological:     Mental Status: She is alert.  Psychiatric:        Speech: Speech normal.        Behavior: Behavior normal.     ED Results / Procedures / Treatments   Labs (all labs ordered are listed, but only abnormal results are displayed) Labs Reviewed - No data to display  EKG None  Radiology DG Ankle Complete Left  Result Date: 10/29/2022 CLINICAL DATA:  Ankle pain following fall. EXAM: LEFT ANKLE COMPLETE - 3+ VIEW COMPARISON:  None Available. FINDINGS: There is a mildly displaced fracture of the medial malleolus. A nondisplaced fracture of the lateral malleolus  is noted. There is a small avulsion fracture of the tip of the medial malleolus which is indeterminate in age. There is a minimally displaced fracture of the distal posterior tibia. No dislocation. Soft tissue swelling is noted about the ankle. IMPRESSION: 1. Minimally displaced fractures of the medial malleolus, lateral malleolus, and posterior tibia. No dislocation. 2. Avulsion fracture of the tip of the medial malleolus, indeterminate in age. Electronically Signed   By: Thornell Sartorius M.D.   On: 10/29/2022 21:14    Procedures .Ortho Injury Treatment  Date/Time: 10/29/2022 11:14 PM  Performed by: Darrick Grinder, PA-C Authorized by: Darrick Grinder, PA-C   Consent:    Consent obtained:  Verbal    Consent given by:  Patient   Risks discussed:  Vascular damage, stiffness, restricted joint movement, recurrent dislocation and nerve damageInjury location: ankle Location details: left ankle Injury type: fracture-dislocation Fracture type: bimalleolar Pre-procedure neurovascular assessment: neurovascularly intact  Anesthesia: Local anesthesia used: no  Patient sedated: NoManipulation performed: no Immobilization: splint Splint type: Posterior leg with stirrup. Splint Applied by: Milon Dikes Post-procedure neurovascular assessment: post-procedure neurovascularly intact       Medications Ordered in ED Medications  ketorolac (TORADOL) 15 MG/ML injection 15 mg (15 mg Intramuscular Not Given 10/29/22 2306)  fentaNYL (SUBLIMAZE) injection 50 mcg (50 mcg Intravenous Given 10/29/22 2311)    ED Course/ Medical Decision Making/ A&P                             Medical Decision Making Risk Prescription drug management.   This patient presents to the ED for concern of left ankle pain, this involves an extensive number of treatment options, and is a complaint that carries with it a high risk of complications and morbidity.  The differential diagnosis includes fracture, dislocation, soft tissue injury, others   Co morbidities that complicate the patient evaluation  Osteoporosis   Additional history obtained:  Additional history obtained from visitor at bedside   Imaging Studies ordered:  I ordered imaging studies including plain films of the left ankle I independently visualized and interpreted imaging which showed  1. Minimally displaced fractures of the medial malleolus, lateral  malleolus, and posterior tibia. No dislocation.  2. Avulsion fracture of the tip of the medial malleolus,  indeterminate in age.   I agree with the radiologist interpretation   Problem List / ED Course / Critical interventions / Medication management   I ordered medication including fentanyl  for pain Reevaluation of the patient after these medicines showed that the patient improved I have reviewed the patients home medicines and have made adjustments as needed   Test / Admission - Considered:  Patient with nondisplaced, possibly minimally displaced fracture of the left ankle with no dislocation.  Patient to be treated with posterior leg splint with stirrup, nonweightbearing of the left lower extremity, and follow-up with orthopedics.  She declines crutches and states she will get a walker at home.  Plan to discharge home with short course of pain medication to be taken as needed.  Patient given follow-up information with name of on-call orthopedic surgeon.         Final Clinical Impression(s) / ED Diagnoses Final diagnoses:  Ankle fracture, bimalleolar, closed, left, initial encounter  Fall, initial encounter    Rx / DC Orders ED Discharge Orders          Ordered    HYDROcodone-acetaminophen (NORCO/VICODIN) 5-325 MG tablet  Every 4  hours PRN        10/29/22 2350              Pamala Duffel 10/29/22 2356    Mesner, Barbara Cower, MD 10/30/22 [phone]

## 2022-10-29 NOTE — ED Triage Notes (Signed)
Pt to ED c/o fall that occurred aprox 4 hours ago. Pt reports mechanical fall, pt slipped on slick ground. Not on blood thinner. Pt did not hit head. No LOC. Pt now c/o left foot pain, pt not able to bare weight. Currently wrapped.

## 2022-10-29 NOTE — Progress Notes (Signed)
Orthopedic Tech Progress Note Patient Details:  [name] [birth year] [id number]  Ortho Devices Type of Ortho Device: Short leg splint, Stirrup splint Ortho Device/Splint Location: LLE Ortho Device/Splint Interventions: Ordered, Application, Adjustment   Post Interventions Patient Tolerated: Well  Al Decant 10/29/2022, 11:17 PM

## 2022-10-29 NOTE — Discharge Instructions (Signed)
You were evaluated today for left-sided ankle pain after a fall. You do have a fracture in both bones of the lower left leg. Please avoid any weight bearing on the left lower extremity. Use a walker at home when ambulating. Please keep the splint on and dry. Call orthopedics at the number listed to schedule a follow up appointment. I did prescribe pain medication to be taken as directed. If you do not need the prescription medication you may take Tylenol or ibuprofen. Do not exceed 4,000 mg acetaminophen in 24 hours from all sources.

## 2022-11-01 NOTE — Telephone Encounter (Signed)
Spoke with pt and advised of Dr Devin Going recommendation to continue current medications of Benicar and Coreg.  Pt states she has an appointment scheduled for 11/18/2022 and is asking who she will be seeing on that day.  She would like to stay at the [facility].  Advised pt of Dr Devin Going recommendations of Dr Anne Fu or Dr Izora Ribas.   Will forward to Dr Devin Going scheduler to reschedule pt's appointment.  Pt verbalizes understanding and thanked Charity fundraiser for the call.

## 2022-11-01 NOTE — Telephone Encounter (Signed)
Bridget Sprague, MD  Loa Socks, LPN; Burt Knack S3 days ago   HP Completely agree! Would stay on the medications due to her history of abnormal heart pumping function.  She is welcome to choose her new provider. If she would like a recommendation and prefers to stay at the [facility] office, Drs. Izora Ribas and Anne Fu are amazing general Cardiologists. If she prefers the [facility] office, Drs. Duke Salvia and Cristal Deer are excellent. If she prefers [facility], Drs. Cindie Crumbly and Bjorn Pippin are also wonderful!

## 2022-11-03 ENCOUNTER — Other Ambulatory Visit (HOSPITAL_COMMUNITY): Payer: Self-pay | Admitting: Orthopedic Surgery

## 2022-11-03 ENCOUNTER — Telehealth: Payer: Self-pay

## 2022-11-03 NOTE — Telephone Encounter (Signed)
Pt is scheduled for 7/31 at 3pm. Med rec and consent are done    Patient Consent for Virtual Visit        [name] has provided verbal consent on 11/03/2022 for a virtual visit (video or telephone).   CONSENT FOR VIRTUAL VISIT FOR:  [name]  By participating in this virtual visit I agree to the following:  I hereby voluntarily request, consent and authorize [facility] and its employed or contracted physicians, physician assistants, nurse practitioners or other licensed health care professionals (the Practitioner), to provide me with telemedicine health care services (the "Services") as deemed necessary by the treating Practitioner. I acknowledge and consent to receive the Services by the Practitioner via telemedicine. I understand that the telemedicine visit will involve communicating with the Practitioner through live audiovisual communication technology and the disclosure of certain medical information by electronic transmission. I acknowledge that I have been given the opportunity to request an in-person assessment or other available alternative prior to the telemedicine visit and am voluntarily participating in the telemedicine visit.  I understand that I have the right to withhold or withdraw my consent to the use of telemedicine in the course of my care at any time, without affecting my right to future care or treatment, and that the Practitioner or I may terminate the telemedicine visit at any time. I understand that I have the right to inspect all information obtained and/or recorded in the course of the telemedicine visit and may receive copies of available information for a reasonable fee.  I understand that some of the potential risks of receiving the Services via telemedicine include:  Delay or interruption in medical evaluation due to technological equipment failure or disruption; Information transmitted may not be sufficient (e.g. poor resolution of  images) to allow for appropriate medical decision making by the Practitioner; and/or  In rare instances, security protocols could fail, causing a breach of personal health information.  Furthermore, I acknowledge that it is my responsibility to provide information about my medical history, conditions and care that is complete and accurate to the best of my ability. I acknowledge that Practitioner's advice, recommendations, and/or decision may be based on factors not within their control, such as incomplete or inaccurate data provided by me or distortions of diagnostic images or specimens that may result from electronic transmissions. I understand that the practice of medicine is not an exact science and that Practitioner makes no warranties or guarantees regarding treatment outcomes. I acknowledge that a copy of this consent can be made available to me via my patient portal [facility] MyChart), or I can request a printed copy by calling the office of [facility].    I understand that my insurance will be billed for this visit.   I have read or had this consent read to me. I understand the contents of this consent, which adequately explains the benefits and risks of the Services being provided via telemedicine.  I have been provided ample opportunity to ask questions regarding this consent and the Services and have had my questions answered to my satisfaction. I give my informed consent for the services to be provided through the use of telemedicine in my medical care

## 2022-11-03 NOTE — Telephone Encounter (Signed)
Pt is scheduled for 7/31 at 3pm. Med rec and consent are done

## 2022-11-03 NOTE — Telephone Encounter (Signed)
   Name: [name]  DOB: Sep 08, 1955  MRN: [id number]  Primary Cardiologist: Tobias Alexander, MD   Preoperative team, please contact this patient and set up a phone call appointment for further preoperative risk assessment. Please obtain consent and complete medication review. Thank you for your help.    Joni Reining, NP 11/03/2022, 3:35 PM [facility]

## 2022-11-03 NOTE — Telephone Encounter (Signed)
   Pre-operative Risk Assessment    Patient Name: [name]  DOB: [birth year] MRN: [id number]      Request for Surgical Clearance    Procedure:   ORIF L Ankle Trimalleolar Fracture; Possible syndesmosis Repair  Date of Surgery:  Clearance 11/11/22                                 Surgeon:  Dr. Toni Arthurs Surgeon's Group or Practice Name:  [facility] Phone number:  [phone] Fax number:  [phone] [place]   Type of Clearance Requested:   - Medical    Type of Anesthesia:  Not Indicated   Additional requests/questions:    Wynetta Fines   11/03/2022, 3:12 PM

## 2022-11-04 ENCOUNTER — Other Ambulatory Visit: Payer: Self-pay

## 2022-11-04 ENCOUNTER — Encounter (HOSPITAL_BASED_OUTPATIENT_CLINIC_OR_DEPARTMENT_OTHER): Payer: Self-pay | Admitting: Orthopedic Surgery

## 2022-11-09 ENCOUNTER — Encounter (HOSPITAL_BASED_OUTPATIENT_CLINIC_OR_DEPARTMENT_OTHER)
Admission: RE | Admit: 2022-11-09 | Discharge: 2022-11-09 | Disposition: A | Discharge status: Home / Self Care | Payer: Medicare Other | Source: Ambulatory Visit | Attending: Orthopedic Surgery | Admitting: Orthopedic Surgery

## 2022-11-09 DIAGNOSIS — Z01812 Encounter for preprocedural laboratory examination: Secondary | ICD-10-CM | POA: Diagnosis present

## 2022-11-09 LAB — BASIC METABOLIC PANEL
Anion gap: 9 (ref 5–15)
BUN: 22 mg/dL (ref 8–23)
CO2: 23 mmol/L (ref 22–32)
Calcium: 9.3 mg/dL (ref 8.9–10.3)
Chloride: 104 mmol/L (ref 98–111)
Creatinine, Ser: 0.98 mg/dL (ref 0.44–1.00)
GFR, Estimated: >60 mL/min (ref >60)
Glucose, Bld: 108 mg/dL — ABNORMAL HIGH (ref 70–99)
Potassium: 4.9 mmol/L (ref 3.5–5.1)
Sodium: 136 mmol/L (ref 135–145)

## 2022-11-09 NOTE — Progress Notes (Signed)

## 2022-11-10 ENCOUNTER — Ambulatory Visit: Payer: Medicare Other | Attending: Internal Medicine

## 2022-11-10 DIAGNOSIS — Z01810 Encounter for preprocedural cardiovascular examination: Secondary | ICD-10-CM

## 2022-11-10 NOTE — Progress Notes (Signed)
Virtual Visit via Telephone Note   Because of HALLEY OLIVAS co-morbid illnesses, she is at least at moderate risk for complications without adequate follow up.  This format is felt to be most appropriate for this patient at this time.  The patient did not have access to video technology/had technical difficulties with video requiring transitioning to audio format only (telephone).  All issues noted in this document were discussed and addressed.  No physical exam could be performed with this format.  Please refer to the patient's chart for her consent to telehealth for [facility].  Evaluation Performed:  Preoperative cardiovascular risk assessment _____________   Date:  11/10/2022   Patient ID:  Paulanne, Shewell 1955/10/19, MRN [id number] Patient Location:  Home Provider location:   Office  Primary Care Provider:  [name], MD Primary Cardiologist:  Tobias Alexander, MD  Chief Complaint / Patient Profile   67 y.o. y/o female with a h/o nonischemic CM, HFrEF, HTN, HLD who is pending ORIF of left ankle and presents today for telephonic preoperative cardiovascular risk assessment.  History of Present Illness    KORALEIGH MIXTER is a 67 y.o. female who presents via audio/video conferencing for a telehealth visit today.  Pt was last seen in cardiology clinic on 02/02/2022 by Dr. Shari Prows. At that time CAMILLE ANTAR was doing well finished a 5K in August 2023 no new cardiac complaints at that time. The patient is now pending procedure as outlined above. Since her last visit, she has been doing well from cardiac perspective but has suffered a fractured left ankle that requires surgical repair.  She is able to complete activities of daily living but is limited due to her cast.  She is able to complete 4 METS of activity without any difficulty.  She denies chest pain, shortness of breath, lower extremity edema, fatigue, palpitations, melena, hematuria, hemoptysis,  diaphoresis, weakness, presyncope, syncope, orthopnea, and PND.    Past Medical History    Past Medical History:  Diagnosis Date   CHF (congestive heart failure) (HCC)    Esophageal reflux    Osteoporosis    Palpitations    Past Surgical History:  Procedure Laterality Date   VAGINAL HYSTERECTOMY      Allergies  Allergies  Allergen Reactions   Cefdinir    Entresto [Sacubitril-Valsartan] Other (See Comments)    Pt reports causes "dizziness and vertigo."   Aldactone [Spironolactone] Palpitations    Pt states causes palpitations    Home Medications    Prior to Admission medications   Medication Sig Start Date End Date Taking? Authorizing Provider  BENICAR 20 MG tablet Take 1 tablet (20 mg total) by mouth daily. 05/14/22   Meriam Sprague, MD  carvedilol (COREG) 6.25 MG tablet TAKE 1 TABLET(6.25 MG) BY MOUTH TWICE DAILY 05/31/22   Meriam Sprague, MD  eplerenone (INSPRA) 25 MG tablet Take 1 tablet (25 mg total) by mouth daily. 05/31/22   Meriam Sprague, MD  estradiol (ESTRACE) 0.1 MG/GM vaginal cream Place 1 Applicatorful vaginally at bedtime.    [provider]  HYDROcodone-acetaminophen (NORCO/VICODIN) 5-325 MG tablet Take 1 tablet by mouth every 4 (four) hours as needed. 10/29/22   Darrick Grinder, PA-C    Physical Exam    Vital Signs:  FREDDA GRAEBNER does not have vital signs available for review today.  Given telephonic nature of communication, physical exam is limited. AAOx3. NAD. Normal affect.  Speech and respirations are unlabored.  Accessory Clinical Findings    None  Assessment & Plan    1.  Preoperative Cardiovascular Risk Assessment:  Patient's RCRI score 6.6%  The patient affirms she has been doing well without any new cardiac symptoms. They are able to achieve 4 METS without cardiac limitations. Therefore, based on ACC/AHA guidelines, the patient would be at acceptable risk for the planned procedure without further  cardiovascular testing. The patient was advised that if she develops new symptoms prior to surgery to contact our office to arrange for a follow-up visit, and she verbalized understanding.   The patient was advised that if she develops new symptoms prior to surgery to contact our office to arrange for a follow-up visit, and she verbalized understanding.   A copy of this note will be routed to requesting surgeon.  Time:   Today, I have spent 10 minutes with the patient with telehealth technology discussing medical history, symptoms, and management plan.     Napoleon Form, Leodis Rains, NP  11/10/2022, 7:33 AM

## 2022-11-11 ENCOUNTER — Other Ambulatory Visit: Payer: Self-pay

## 2022-11-11 ENCOUNTER — Ambulatory Visit (HOSPITAL_BASED_OUTPATIENT_CLINIC_OR_DEPARTMENT_OTHER): Payer: Medicare Other | Admitting: Anesthesiology

## 2022-11-11 ENCOUNTER — Encounter (HOSPITAL_BASED_OUTPATIENT_CLINIC_OR_DEPARTMENT_OTHER): Payer: Self-pay | Admitting: Orthopedic Surgery

## 2022-11-11 ENCOUNTER — Ambulatory Visit (HOSPITAL_BASED_OUTPATIENT_CLINIC_OR_DEPARTMENT_OTHER): Payer: Medicare Other

## 2022-11-11 ENCOUNTER — Encounter (HOSPITAL_BASED_OUTPATIENT_CLINIC_OR_DEPARTMENT_OTHER)
Admission: RE | Disposition: A | Discharge status: Home / Self Care | Payer: Self-pay | Source: Home / Self Care | Attending: Orthopedic Surgery

## 2022-11-11 ENCOUNTER — Ambulatory Visit (HOSPITAL_BASED_OUTPATIENT_CLINIC_OR_DEPARTMENT_OTHER)
Admission: RE | Admit: 2022-11-11 | Discharge: 2022-11-11 | Disposition: A | Discharge status: Home / Self Care | Payer: Medicare Other | Attending: Orthopedic Surgery | Admitting: Orthopedic Surgery

## 2022-11-11 DIAGNOSIS — I5022 Chronic systolic (congestive) heart failure: Secondary | ICD-10-CM

## 2022-11-11 DIAGNOSIS — W010XXA Fall on same level from slipping, tripping and stumbling without subsequent striking against object, initial encounter: Secondary | ICD-10-CM | POA: Insufficient documentation

## 2022-11-11 DIAGNOSIS — I110 Hypertensive heart disease with heart failure: Secondary | ICD-10-CM | POA: Diagnosis not present

## 2022-11-11 DIAGNOSIS — Y93H2 Activity, gardening and landscaping: Secondary | ICD-10-CM | POA: Diagnosis not present

## 2022-11-11 DIAGNOSIS — S82852A Displaced trimalleolar fracture of left lower leg, initial encounter for closed fracture: Secondary | ICD-10-CM | POA: Diagnosis not present

## 2022-11-11 DIAGNOSIS — I509 Heart failure, unspecified: Secondary | ICD-10-CM | POA: Diagnosis not present

## 2022-11-11 HISTORY — PX: ORIF ANKLE FRACTURE: SHX5408

## 2022-11-11 HISTORY — DX: Heart failure, unspecified: I50.9

## 2022-11-11 SURGERY — OPEN REDUCTION INTERNAL FIXATION (ORIF) ANKLE FRACTURE
Anesthesia: General | Site: Ankle | Laterality: Left

## 2022-11-11 MED ORDER — PROPOFOL 500 MG/50ML IV EMUL
INTRAVENOUS | Status: DC | PRN
  Administered 2022-11-11: 25 ug/kg/min via INTRAVENOUS

## 2022-11-11 MED ORDER — LACTATED RINGERS IV SOLN: INTRAVENOUS | Status: DC

## 2022-11-11 MED ORDER — OXYCODONE HCL 5 MG/5ML PO SOLN: 5.0000 mg | Freq: Once | ORAL | Status: DC | PRN

## 2022-11-11 MED ORDER — HYDROMORPHONE HCL 1 MG/ML IJ SOLN
INTRAMUSCULAR | Status: AC
  Filled 2022-11-11: qty 0.5

## 2022-11-11 MED ORDER — MEPERIDINE HCL 25 MG/ML IJ SOLN: 6.2500 mg | INTRAMUSCULAR | Status: DC | PRN

## 2022-11-11 MED ORDER — ACETAMINOPHEN 10 MG/ML IV SOLN
INTRAVENOUS | Status: AC
  Filled 2022-11-11: qty 100

## 2022-11-11 MED ORDER — SODIUM CHLORIDE 0.9 % IV SOLN: INTRAVENOUS | Status: DC

## 2022-11-11 MED ORDER — DEXAMETHASONE SODIUM PHOSPHATE 10 MG/ML IJ SOLN
INTRAMUSCULAR | Status: DC | PRN
  Administered 2022-11-11: 4 mg via INTRAVENOUS

## 2022-11-11 MED ORDER — LIDOCAINE HCL (CARDIAC) PF 100 MG/5ML IV SOSY
PREFILLED_SYRINGE | INTRAVENOUS | Status: DC | PRN
  Administered 2022-11-11: 30 mg via INTRAVENOUS

## 2022-11-11 MED ORDER — HYDROMORPHONE HCL 1 MG/ML IJ SOLN
0.2500 mg | INTRAMUSCULAR | Status: DC | PRN
  Administered 2022-11-11 (×4): 0.5 mg via INTRAVENOUS

## 2022-11-11 MED ORDER — OXYCODONE HCL 5 MG PO TABS: 5.0000 mg | ORAL_TABLET | Freq: Four times a day (QID) | ORAL | 0 refills | Status: AC | PRN

## 2022-11-11 MED ORDER — ROPIVACAINE HCL 5 MG/ML IJ SOLN
INTRAMUSCULAR | Status: DC | PRN
Start: 2022-11-11 — End: 2022-11-11
  Administered 2022-11-11: 50 mL via PERINEURAL

## 2022-11-11 MED ORDER — OXYCODONE HCL 5 MG PO TABS: 5.0000 mg | ORAL_TABLET | Freq: Once | ORAL | Status: DC | PRN

## 2022-11-11 MED ORDER — FENTANYL CITRATE (PF) 100 MCG/2ML IJ SOLN
INTRAMUSCULAR | Status: AC
  Filled 2022-11-11: qty 2

## 2022-11-11 MED ORDER — RIVAROXABAN 10 MG PO TABS: 10.0000 mg | ORAL_TABLET | Freq: Every day | ORAL | 0 refills | Status: DC

## 2022-11-11 MED ORDER — 0.9 % SODIUM CHLORIDE (POUR BTL) OPTIME
TOPICAL | Status: DC | PRN
  Administered 2022-11-11: 200 mL

## 2022-11-11 MED ORDER — VANCOMYCIN HCL 500 MG IV SOLR
INTRAVENOUS | Status: AC
  Filled 2022-11-11: qty 10

## 2022-11-11 MED ORDER — CEFAZOLIN SODIUM-DEXTROSE 2-4 GM/100ML-% IV SOLN
2.0000 g | INTRAVENOUS | Status: AC
  Administered 2022-11-11: 2 g via INTRAVENOUS

## 2022-11-11 MED ORDER — PROMETHAZINE HCL 25 MG/ML IJ SOLN: 6.2500 mg | INTRAMUSCULAR | Status: DC | PRN

## 2022-11-11 MED ORDER — PROPOFOL 10 MG/ML IV BOLUS
INTRAVENOUS | Status: DC | PRN
  Administered 2022-11-11: 100 mg via INTRAVENOUS

## 2022-11-11 MED ORDER — AMISULPRIDE (ANTIEMETIC) 5 MG/2ML IV SOLN: 10.0000 mg | Freq: Once | INTRAVENOUS | Status: DC | PRN

## 2022-11-11 MED ORDER — MIDAZOLAM HCL 2 MG/2ML IJ SOLN
2.0000 mg | Freq: Once | INTRAMUSCULAR | Status: AC
  Administered 2022-11-11: 2 mg via INTRAVENOUS

## 2022-11-11 MED ORDER — FENTANYL CITRATE (PF) 100 MCG/2ML IJ SOLN
100.0000 ug | Freq: Once | INTRAMUSCULAR | Status: AC
  Administered 2022-11-11: 100 ug via INTRAVENOUS

## 2022-11-11 MED ORDER — MIDAZOLAM HCL 2 MG/2ML IJ SOLN
INTRAMUSCULAR | Status: AC
  Filled 2022-11-11: qty 2

## 2022-11-11 MED ORDER — VANCOMYCIN HCL 500 MG IV SOLR
INTRAVENOUS | Status: DC | PRN
  Administered 2022-11-11: 500 mg via TOPICAL

## 2022-11-11 MED ORDER — ACETAMINOPHEN 10 MG/ML IV SOLN
1000.0000 mg | Freq: Once | INTRAVENOUS | Status: AC
  Administered 2022-11-11: 1000 mg via INTRAVENOUS

## 2022-11-11 MED ORDER — BUPIVACAINE HCL (PF) 0.25 % IJ SOLN
INTRAMUSCULAR | Status: AC
  Filled 2022-11-11: qty 30

## 2022-11-11 MED ORDER — CEFAZOLIN SODIUM-DEXTROSE 2-4 GM/100ML-% IV SOLN
INTRAVENOUS | Status: AC
  Filled 2022-11-11: qty 100

## 2022-11-11 MED ORDER — ONDANSETRON HCL 4 MG/2ML IJ SOLN
INTRAMUSCULAR | Status: DC | PRN
  Administered 2022-11-11: 4 mg via INTRAVENOUS

## 2022-11-11 SURGICAL SUPPLY — 76 items
APL PRP STRL LF DISP 70% ISPRP (MISCELLANEOUS) ×1
BANDAGE ESMARK 6X9 LF (GAUZE/BANDAGES/DRESSINGS) IMPLANT
BIT DRILL 2.5X2.75 QC CALB (BIT) IMPLANT
BIT DRILL 2.9 CANN QC NONSTRL (BIT) IMPLANT
BIT DRILL 3.5X5.5 QC CALB (BIT) IMPLANT
BLADE SURG 15 STRL LF DISP TIS (BLADE) ×2 IMPLANT
BLADE SURG 15 STRL SS (BLADE) ×2
BNDG CMPR 5X4 KNIT ELC UNQ LF (GAUZE/BANDAGES/DRESSINGS) ×1
BNDG CMPR 6 X 5 YARDS HK CLSR (GAUZE/BANDAGES/DRESSINGS) ×1
BNDG CMPR 9X4 STRL LF SNTH (GAUZE/BANDAGES/DRESSINGS)
BNDG CMPR 9X6 STRL LF SNTH (GAUZE/BANDAGES/DRESSINGS)
BNDG ELASTIC 4INX 5YD STR LF (GAUZE/BANDAGES/DRESSINGS) ×1 IMPLANT
BNDG ELASTIC 6INX 5YD STR LF (GAUZE/BANDAGES/DRESSINGS) ×1 IMPLANT
BNDG ESMARK 4X9 LF (GAUZE/BANDAGES/DRESSINGS) IMPLANT
BNDG ESMARK 6X9 LF (GAUZE/BANDAGES/DRESSINGS)
CANISTER SUCT 1200ML W/VALVE (MISCELLANEOUS) ×1 IMPLANT
CHLORAPREP W/TINT 26 (MISCELLANEOUS) ×1 IMPLANT
COVER BACK TABLE 60X90IN (DRAPES) ×1 IMPLANT
CUFF TOURN SGL QUICK 34 (TOURNIQUET CUFF)
CUFF TRNQT CYL 34X4.125X (TOURNIQUET CUFF) IMPLANT
DRAPE EXTREMITY T 121X128X90 (DISPOSABLE) ×1 IMPLANT
DRAPE OEC MINIVIEW 54X84 (DRAPES) ×1 IMPLANT
DRAPE U-SHAPE 47X51 STRL (DRAPES) ×1 IMPLANT
DRSG MEPITEL 4X7.2 (GAUZE/BANDAGES/DRESSINGS) ×1 IMPLANT
ELECT REM PT RETURN 9FT ADLT (ELECTROSURGICAL) ×1
ELECTRODE REM PT RTRN 9FT ADLT (ELECTROSURGICAL) ×1 IMPLANT
GAUZE PAD ABD 8X10 STRL (GAUZE/BANDAGES/DRESSINGS) ×2 IMPLANT
GAUZE SPONGE 4X4 12PLY STRL (GAUZE/BANDAGES/DRESSINGS) ×1 IMPLANT
GLOVE BIO SURGEON STRL SZ8 (GLOVE) ×1 IMPLANT
GLOVE BIOGEL PI IND STRL 7.0 (GLOVE) IMPLANT
GLOVE BIOGEL PI IND STRL 8 (GLOVE) ×2 IMPLANT
GLOVE ECLIPSE 8.0 STRL XLNG CF (GLOVE) ×1 IMPLANT
GLOVE SURG SS PI 7.0 STRL IVOR (GLOVE) IMPLANT
GOWN STRL REUS W/ TWL LRG LVL3 (GOWN DISPOSABLE) ×1 IMPLANT
GOWN STRL REUS W/ TWL XL LVL3 (GOWN DISPOSABLE) ×2 IMPLANT
GOWN STRL REUS W/TWL LRG LVL3 (GOWN DISPOSABLE) ×2
GOWN STRL REUS W/TWL XL LVL3 (GOWN DISPOSABLE) ×1
K-WIRE ACE 1.6X6 (WIRE) ×2
KWIRE ACE 1.6X6 (WIRE) IMPLANT
NDL HYPO 22X1.5 SAFETY MO (MISCELLANEOUS) IMPLANT
NEEDLE HYPO 22X1.5 SAFETY MO (MISCELLANEOUS)
NS IRRIG 1000ML POUR BTL (IV SOLUTION) ×1 IMPLANT
PACK BASIN DAY SURGERY FS (CUSTOM PROCEDURE TRAY) ×1 IMPLANT
PAD CAST 4YDX4 CTTN HI CHSV (CAST SUPPLIES) ×1 IMPLANT
PADDING CAST ABS COTTON 4X4 ST (CAST SUPPLIES) IMPLANT
PADDING CAST COTTON 4X4 STRL (CAST SUPPLIES) ×1
PADDING CAST COTTON 6X4 STRL (CAST SUPPLIES) ×1 IMPLANT
PENCIL SMOKE EVACUATOR (MISCELLANEOUS) ×1 IMPLANT
PLATE ACE 100DEG 7HOLE (Plate) IMPLANT
SANITIZER HAND PURELL FF 515ML (MISCELLANEOUS) ×1 IMPLANT
SCREW ACE CAN 4.0 40M (Screw) IMPLANT
SCREW ACE CAN 4.0 46M (Screw) IMPLANT
SCREW CORTICAL 3.5MM 16MM (Screw) IMPLANT
SCREW CORTICAL 3.5MM 18MM (Screw) IMPLANT
SCREW CORTICAL 3.5MM 20MM (Screw) IMPLANT
SCREW CORTICAL 3.5MM 22MM (Screw) IMPLANT
SCREW CORTICAL 3.5MM 24MM (Screw) IMPLANT
SHEET MEDIUM DRAPE 40X70 STRL (DRAPES) ×1 IMPLANT
SLEEVE SCD COMPRESS KNEE MED (STOCKING) ×1 IMPLANT
SPIKE FLUID TRANSFER (MISCELLANEOUS) IMPLANT
SPLINT PLASTER CAST FAST 5X30 (CAST SUPPLIES) ×20 IMPLANT
SPONGE T-LAP 18X18 ~~LOC~~+RFID (SPONGE) ×1 IMPLANT
STOCKINETTE 6 STRL (DRAPES) ×1 IMPLANT
SUCTION TUBE FRAZIER 10FR DISP (SUCTIONS) ×1 IMPLANT
SUT ETHILON 3 0 PS 1 (SUTURE) ×1 IMPLANT
SUT FIBERWIRE #2 38 T-5 BLUE (SUTURE)
SUT MNCRL AB 3-0 PS2 18 (SUTURE) IMPLANT
SUT VIC AB 2-0 SH 27 (SUTURE) ×1
SUT VIC AB 2-0 SH 27XBRD (SUTURE) ×1 IMPLANT
SUT VICRYL 0 SH 27 (SUTURE) IMPLANT
SUTURE FIBERWR #2 38 T-5 BLUE (SUTURE) IMPLANT
SYR BULB EAR ULCER 3OZ GRN STR (SYRINGE) ×1 IMPLANT
SYR CONTROL 10ML LL (SYRINGE) IMPLANT
TOWEL GREEN STERILE FF (TOWEL DISPOSABLE) ×2 IMPLANT
TUBE CONNECTING 20X1/4 (TUBING) ×1 IMPLANT
UNDERPAD 30X36 HEAVY ABSORB (UNDERPADS AND DIAPERS) ×1 IMPLANT

## 2022-11-11 NOTE — Op Note (Signed)
11/11/2022  2:01 PM  PATIENT:  [name]  67 y.o. female  PRE-OPERATIVE DIAGNOSIS:  Closed trimalleolar fracture of left ankle  POST-OPERATIVE DIAGNOSIS:  Closed trimalleolar fracture of left ankle  Procedure(s): 1.  Open treatment left ankle trimalleolar fracture with internal fixation including fixation of the posterior malleolus fracture 2.  Stress examination of the left ankle under fluoroscopy 3.  AP, lateral and mortise radiographs of the left ankle  SURGEON:  Toni Arthurs, MD  ASSISTANT: None  ANESTHESIA:   General, regional  EBL:  minimal   TOURNIQUET:   Total Tourniquet Time Documented: Thigh (Left) - 40 minutes Total: Thigh (Left) - 40 minutes  COMPLICATIONS:  None apparent  DISPOSITION:  Extubated, awake and stable to recovery.  INDICATION FOR PROCEDURE: 67 year old female injured her left ankle in a fall.  Radiographs reveal a trimalleolar displaced ankle fracture.  She presents now for operative treatment of this displaced and unstable left ankle injury.  The risks and benefits of the alternative treatment options have been discussed in detail.  The patient wishes to proceed with surgery and specifically understands risks of bleeding, infection, nerve damage, blood clots, need for additional surgery, amputation and death.   PROCEDURE IN DETAIL:  After pre operative consent was obtained, and the correct operative site was identified, the patient was brought to the operating room and placed supine on the OR table.  Anesthesia was administered.  Pre-operative antibiotics were administered.  A surgical timeout was taken.  The left lower extremity was prepped and draped in standard sterile fashion with a tourniquet around the thigh.  The extremity was elevated, and the tourniquet was inflated to 250 mmHg.  A longitudinal incision was made over the lateral talus.  Dissection was carried sharply down through the subcutaneous tissues.  Fracture site was cleaned of all  hematoma and reduced.  A 3.5 mm lag screw was inserted from anterior to posterior across the fracture site.  It was noted to have adequate purchase.  A 7 hole one third tubular plate from the [employer] small frag set was then contoured to fit the lateral malleolus.  It was secured proximally with 3 bicortical screws and distally with 3 unicortical screws.  Attention was turned to the medial side of the ankle.  A longitudinal incision was made over the medial malleolus.  Dissection was carried sharply down through the subcutaneous tissues.  Fracture site was identified.  It was cleaned of all hematoma.  The fracture was reduced and held with a pointed tenaculum.  K wires were placed across the fracture site.  4 mm partially-threaded cannulated screws were inserted over the K wires.  Both were noted to have adequate purchase.  AP, mortise and lateral radiographs confirmed appropriate reduction of the medial and lateral malleolus fractures.  A K wire was inserted percutaneously across the posterior malleolus fracture.  A partially-threaded cannulated screw was then inserted holding the posterior malleolus fracture appropriately reduced.  A stress examination was then performed under live fluoroscopy.  There was no evidence of syndesmosis instability.  The wounds were irrigated copiously and sprinkled with vancomycin powder.  The incisions were closed with horizontal mattress sutures of 3-0 nylon.  Sterile dressings were applied followed by a well-padded short leg splint.  Tourniquet was released after application of dressings.  Patient was awakened from anesthesia and transported to the recovery room in stable condition.   FOLLOW UP PLAN: Nonweightbearing on the left lower extremity.  Follow-up in the office in 2 weeks  for suture removal and conversion to a short leg cast.  Xarelto for DVT prophylaxis due to the patient's age.   RADIOGRAPHS: AP, mortise and lateral radiographs of the left ankle are obtained  intraoperatively.  These show interval reduction and fixation of the trimalleolar ankle fracture.  Hardware is appropriately positioned and of the appropriate lengths.  No other acute injuries are noted.

## 2022-11-11 NOTE — Anesthesia Postprocedure Evaluation (Signed)
Anesthesia Post Note  Patient: [name]  Procedure(s) Performed: OPEN REDUCTION INTERNAL FIXATION (ORIF) TRIMALLEOLAR ANKLE FRACTURE (Left: Ankle)     Patient location during evaluation: PACU Anesthesia Type: General Level of consciousness: awake and alert Pain management: pain level controlled Vital Signs Assessment: post-procedure vital signs reviewed and stable Respiratory status: spontaneous breathing, nonlabored ventilation and respiratory function stable Cardiovascular status: blood pressure returned to baseline and stable Postop Assessment: no apparent nausea or vomiting Anesthetic complications: no   No notable events documented.  Last Vitals:  Vitals:   11/11/22 1445 11/11/22 1500  BP: 136/69 (!) 136/56  Pulse: (!) 58 69  Resp: (!) 26 19  Temp:    SpO2: 100% 99%    Last Pain:  Vitals:   11/11/22 1500  TempSrc:   PainSc: 4                  Lowella Curb

## 2022-11-11 NOTE — Anesthesia Preprocedure Evaluation (Signed)
Anesthesia Evaluation  Patient identified by MRN, date of birth, ID band Patient awake    Reviewed: Allergy & Precautions, H&P , NPO status , Patient's Chart, lab work & pertinent test results  Airway Mallampati: II  TM Distance: >3 FB Neck ROM: Full    Dental no notable dental hx.    Pulmonary neg pulmonary ROS   Pulmonary exam normal breath sounds clear to auscultation       Cardiovascular hypertension, Pt. on medications +CHF  Normal cardiovascular exam Rhythm:Regular Rate:Normal     Neuro/Psych negative neurological ROS  negative psych ROS   GI/Hepatic Neg liver ROS,GERD  ,,  Endo/Other  negative endocrine ROS    Renal/GU negative Renal ROS  negative genitourinary   Musculoskeletal negative musculoskeletal ROS (+)    Abdominal   Peds negative pediatric ROS (+)  Hematology negative hematology ROS (+)   Anesthesia Other Findings   Reproductive/Obstetrics negative OB ROS                             Anesthesia Physical Anesthesia Plan  ASA: 2  Anesthesia Plan: General   Post-op Pain Management: Regional block*   Induction: Intravenous  PONV Risk Score and Plan: 3 and Ondansetron, Dexamethasone, Midazolam and Treatment may vary due to age or medical condition  Airway Management Planned: LMA  Additional Equipment:   Intra-op Plan:   Post-operative Plan: Extubation in OR  Informed Consent: I have reviewed the patients History and Physical, chart, labs and discussed the procedure including the risks, benefits and alternatives for the proposed anesthesia with the patient or authorized representative who has indicated his/her understanding and acceptance.     Dental advisory given  Plan Discussed with: CRNA  Anesthesia Plan Comments:        Anesthesia Quick Evaluation

## 2022-11-11 NOTE — Transfer of Care (Signed)
Immediate Anesthesia Transfer of Care Note  Patient: [name]  Procedure(s) Performed: OPEN REDUCTION INTERNAL FIXATION (ORIF) trimalleolar ANKLE FRACTURE (Left: Ankle)  Patient Location: PACU  Anesthesia Type:GA combined with regional for post-op pain  Level of Consciousness: drowsy and patient cooperative  Airway & Oxygen Therapy: Patient Spontanous Breathing and Patient connected to face mask oxygen  Post-op Assessment: Report given to RN and Post -op Vital signs reviewed and stable  Post vital signs: Reviewed and stable  Last Vitals:  Vitals Value Taken Time  BP 138/62 11/11/22 1351  Temp    Pulse 72 11/11/22 1353  Resp 12 11/11/22 1353  SpO2 100 % 11/11/22 1353  Vitals shown include unfiled device data.  Last Pain:  Vitals:   11/11/22 1133  TempSrc: Oral  PainSc: 0-No pain      Patients Stated Pain Goal: 3 (11/11/22 1133)  Complications: No notable events documented.

## 2022-11-11 NOTE — Anesthesia Procedure Notes (Signed)
Anesthesia Regional Block: Adductor canal block   Pre-Anesthetic Checklist: , timeout performed,  Correct Patient, Correct Site, Correct Laterality,  Correct Procedure, Correct Position, site marked,  Risks and benefits discussed,  Surgical consent,  Pre-op evaluation,  At surgeon's request and post-op pain management  Laterality: Left  Prep: chloraprep       Needles:   Needle Type: Stimiplex     Needle Length: 9cm  Needle Gauge: 21     Additional Needles:   Procedures:,,,, ultrasound used (permanent image in chart),,    Narrative:  Start time: 11/11/2022 12:04 PM End time: 11/11/2022 12:09 PM Injection made incrementally with aspirations every 5 mL.  Performed by: Personally  Anesthesiologist: Lowella Curb, MD

## 2022-11-11 NOTE — Discharge Instructions (Addendum)
Toni Arthurs, MD [facility]  Please read the following information regarding your care after surgery.  Medications  You only need a prescription for the narcotic pain medicine (ex. oxycodone, Percocet, Norco).  All of the other medicines listed below are available over the counter. X Aleve 2 pills twice a day for the first 3 days after surgery. X acetominophen (Tylenol) 650 mg every 4-6 hours as you need for minor to moderate pain X oxycodone as prescribed for severe pain  Narcotic pain medicine (ex. oxycodone, Percocet, Vicodin) will cause constipation.  To prevent this problem, take the following medicines while you are taking any pain medicine. X docusate sodium (Colace) 100 mg twice a day X senna (Senokot) 2 tablets twice a day  X To help prevent blood clots, take XArelto daily for two weeks after surgery.  You should also get up every hour while you are awake to move around.    Weight Bearing  X Do not bear any weight on the operated leg or foot.  Cast / Splint / Dressing X Keep your splint, cast or dressing clean and dry.  Don't put anything (coat hanger, pencil, etc) down inside of it.  If it gets damp, use a hair dryer on the cool setting to dry it.  If it gets soaked, call the office to schedule an appointment for a cast change.  After your dressing, cast or splint is removed; you may shower, but do not soak or scrub the wound.  Allow the water to run over it, and then gently pat it dry.  Swelling It is normal for you to have swelling where you had surgery.  To reduce swelling and pain, keep your toes above your nose for at least 3 days after surgery.  It may be necessary to keep your foot or leg elevated for several weeks.  If it hurts, it should be elevated.  Follow Up Call my office at [phone] when you are discharged from the hospital or surgery center to schedule an appointment to be seen two weeks after surgery.  Call my office at [phone] if you develop a fever  >101.5 F, nausea, vomiting, bleeding from the surgical site or severe pain.     Post Anesthesia Home Care Instructions  Activity: Get plenty of rest for the remainder of the day. A responsible individual must stay with you for 24 hours following the procedure.  For the next 24 hours, DO NOT: -Drive a car -Advertising copywriter -Drink alcoholic beverages -Take any medication unless instructed by your physician -Make any legal decisions or sign important papers.  Meals: Start with liquid foods such as gelatin or soup. Progress to regular foods as tolerated. Avoid greasy, spicy, heavy foods. If nausea and/or vomiting occur, drink only clear liquids until the nausea and/or vomiting subsides. Call your physician if vomiting continues.  Special Instructions/Symptoms: Your throat may feel dry or sore from the anesthesia or the breathing tube placed in your throat during surgery. If this causes discomfort, gargle with warm salt water. The discomfort should disappear within 24 hours.  If you had a scopolamine patch placed behind your ear for the management of post- operative nausea and/or vomiting:  1. The medication in the patch is effective for 72 hours, after which it should be removed.  Wrap patch in a tissue and discard in the trash. Wash hands thoroughly with soap and water. 2. You may remove the patch earlier than 72 hours if you experience unpleasant side effects which may include  dry mouth, dizziness or visual disturbances. 3. Avoid touching the patch. Wash your hands with soap and water after contact with the patch.      Regional Anesthesia Blocks  1. Numbness or the inability to move the "blocked" extremity may last from 3-48 hours after placement. The length of time depends on the medication injected and your individual response to the medication. If the numbness is not going away after 48 hours, call your surgeon.  2. The extremity that is blocked will need to be protected until the  numbness is gone and the  Strength has returned. Because you cannot feel it, you will need to take extra care to avoid injury. Because it may be weak, you may have difficulty moving it or using it. You may not know what position it is in without looking at it while the block is in effect.  3. For blocks in the legs and feet, returning to weight bearing and walking needs to be done carefully. You will need to wait until the numbness is entirely gone and the strength has returned. You should be able to move your leg and foot normally before you try and bear weight or walk. You will need someone to be with you when you first try to ensure you do not fall and possibly risk injury.  4. Bruising and tenderness at the needle site are common side effects and will resolve in a few days.  5. Persistent numbness or new problems with movement should be communicated to the surgeon or the [facility] [phone] [facility] [phone]).  May take Tylenol at 8:15pm

## 2022-11-11 NOTE — H&P (Signed)
[name] is an 67 y.o. female.   Chief Complaint: Left ankle pain HPI: 67 year old female slipped on wet grass while running weedeater injuring her left ankle.  She has a displaced trimalleolar fracture presents today for operative treatment.  Past Medical History:  Diagnosis Date   CHF (congestive heart failure) (HCC)    Esophageal reflux    Osteoporosis    Palpitations     Past Surgical History:  Procedure Laterality Date   VAGINAL HYSTERECTOMY      Family History  Problem Relation Age of Onset   Heart attack Mother 47   Diabetes Maternal Uncle    CAD Other        multiple family members   Thyroid disease Neg Hx    Stroke Neg Hx    Social History:  reports that she has never smoked. She has never used smokeless tobacco. She reports that she does not drink alcohol and does not use drugs.  Allergies:  Allergies  Allergen Reactions   Cefdinir    Entresto [Sacubitril-Valsartan] Other (See Comments)    Pt reports causes "dizziness and vertigo."   Aldactone [Spironolactone] Palpitations    Pt states causes palpitations    Medications Prior to Admission  Medication Sig Dispense Refill   BENICAR 20 MG tablet Take 1 tablet (20 mg total) by mouth daily. 90 tablet 2   carvedilol (COREG) 6.25 MG tablet TAKE 1 TABLET(6.25 MG) BY MOUTH TWICE DAILY 180 tablet 2   eplerenone (INSPRA) 25 MG tablet Take 1 tablet (25 mg total) by mouth daily. 90 tablet 2   estradiol (ESTRACE) 0.1 MG/GM vaginal cream Place 1 Applicatorful vaginally at bedtime.     HYDROcodone-acetaminophen (NORCO/VICODIN) 5-325 MG tablet Take 1 tablet by mouth every 4 (four) hours as needed. 10 tablet 0    Results for orders placed or performed during the hospital encounter of 11/11/22 (from the past 48 hour(s))  Basic metabolic panel per protocol     Status: Abnormal   Collection Time: 11/09/22 11:26 AM  Result Value Ref Range   Sodium 136 135 - 145 mmol/L   Potassium 4.9 3.5 - 5.1 mmol/L   Chloride 104  98 - 111 mmol/L   CO2 23 22 - 32 mmol/L   Glucose, Bld 108 (H) 70 - 99 mg/dL    Comment: Glucose reference range applies only to samples taken after fasting for at least 8 hours.   BUN 22 8 - 23 mg/dL   Creatinine, Ser 0.10 0.44 - 1.00 mg/dL   Calcium 9.3 8.9 - 27.2 mg/dL   GFR, Estimated >53 >66 mL/min    Comment: (NOTE) Calculated using the CKD-EPI Creatinine Equation (2021)    Anion gap 9 5 - 15    Comment: Performed at [facility], [address], [place], [place] [address]   No results found.  Review of Systems no recent fever, chills, nausea, vomiting or changes in her appetite  Height 5\' 3"  (1.6 m), weight 71.2 kg. Physical Exam  Well-nourished well-developed woman in no apparent distress.  Alert and oriented.  Normal mood and affect.  Nonweightbearing gait on the left.  Left ankle is splinted. Assessment/Plan Left ankle trimalleolar fracture -to the operating room today for open treatment with internal fixation.  The risks and benefits of the alternative treatment options have been discussed in detail.  The patient wishes to proceed with surgery and specifically understands risks of bleeding, infection, nerve damage, blood clots, need for additional surgery, amputation and death.  Toni Arthurs, MD 11/11/2022, 10:52 AM

## 2022-11-11 NOTE — Progress Notes (Signed)
Assisted Dr. Miller with left, adductor canal, popliteal, ultrasound guided block. Side rails up, monitors on throughout procedure. See vital signs in flow sheet. Tolerated Procedure well. 

## 2022-11-11 NOTE — Anesthesia Procedure Notes (Signed)
Anesthesia Regional Block: Popliteal block   Pre-Anesthetic Checklist: , timeout performed,  Correct Patient, Correct Site, Correct Laterality,  Correct Procedure, Correct Position, site marked,  Risks and benefits discussed,  Surgical consent,  Pre-op evaluation,  At surgeon's request and post-op pain management  Laterality: Left  Prep: chloraprep       Needles:  Injection technique: Single-shot  Needle Type: Stimiplex     Needle Length: 9cm  Needle Gauge: 21     Additional Needles:   Procedures:,,,, ultrasound used (permanent image in chart),,    Narrative:  Start time: 11/11/2022 12:05 PM End time: 11/11/2022 12:10 PM Injection made incrementally with aspirations every 5 mL.  Performed by: Personally  Anesthesiologist: Lowella Curb, MD

## 2022-11-11 NOTE — Anesthesia Procedure Notes (Signed)
Procedure Name: LMA Insertion Date/Time: 11/11/2022 12:48 PM  Performed by: Nuno Brubacher, Jewel Baize, CRNAPre-anesthesia Checklist: Patient identified, Emergency Drugs available, Suction available and Patient being monitored Patient Re-evaluated:Patient Re-evaluated prior to induction Oxygen Delivery Method: Circle system utilized Preoxygenation: Pre-oxygenation with 100% oxygen Induction Type: IV induction Ventilation: Mask ventilation without difficulty LMA: LMA inserted LMA Size: 4.0 Number of attempts: 1 Airway Equipment and Method: Bite block Placement Confirmation: positive ETCO2 Tube secured with: Tape Dental Injury: Teeth and Oropharynx as per pre-operative assessment

## 2022-11-12 ENCOUNTER — Encounter (HOSPITAL_BASED_OUTPATIENT_CLINIC_OR_DEPARTMENT_OTHER): Payer: Self-pay | Admitting: Orthopedic Surgery

## 2022-11-16 ENCOUNTER — Telehealth: Payer: Self-pay | Admitting: Internal Medicine

## 2022-11-16 NOTE — Telephone Encounter (Signed)
Spoke with the patient and advised that this was okay per Dr. Izora Ribas.

## 2022-11-16 NOTE — Telephone Encounter (Signed)
Pt called in stating after her surgery her Dr. Victorino Dike put her on Xarelto 10mg  for 14 days and she wants to make sure MD is okay with her taking that. Please advise.

## 2022-11-18 ENCOUNTER — Ambulatory Visit: Payer: Medicare Other | Admitting: Cardiology

## 2023-01-14 ENCOUNTER — Encounter: Payer: Self-pay | Admitting: Internal Medicine

## 2023-01-14 ENCOUNTER — Ambulatory Visit: Payer: Medicare Other | Attending: Cardiology | Admitting: Internal Medicine

## 2023-01-14 VITALS — BP 124/60 | HR 49 | Ht 63.0 in | Wt 152.6 lb

## 2023-01-14 DIAGNOSIS — I5022 Chronic systolic (congestive) heart failure: Secondary | ICD-10-CM | POA: Diagnosis present

## 2023-01-14 DIAGNOSIS — Z01810 Encounter for preprocedural cardiovascular examination: Secondary | ICD-10-CM | POA: Diagnosis not present

## 2023-01-14 DIAGNOSIS — I472 Ventricular tachycardia, unspecified: Secondary | ICD-10-CM | POA: Diagnosis present

## 2023-01-14 DIAGNOSIS — E782 Mixed hyperlipidemia: Secondary | ICD-10-CM | POA: Insufficient documentation

## 2023-01-14 DIAGNOSIS — I4729 Other ventricular tachycardia: Secondary | ICD-10-CM | POA: Diagnosis present

## 2023-01-14 DIAGNOSIS — I10 Essential (primary) hypertension: Secondary | ICD-10-CM | POA: Insufficient documentation

## 2023-01-14 NOTE — Progress Notes (Signed)
Cardiology Office Note:  .    Date:  01/14/2023  ID:  Bridget, [name] 02-07-1956, MRN [id number] PCP: [name], MD  [facility] Providers Cardiologist:  Tobias Alexander, MD     CC: Transition to new cardiologist  History of Present Illness: .    [name] is a 67 y.o. female with history NICM, and HLD who presents for follow up.  Discussed the use of AI scribe software for clinical note transcription with the patient, who gave verbal consent to proceed.  History of Present Illness         Miss [name], a 67 year old patient with a history of hypertension, heart failure with recovered ejection fraction, and hyperlipidemia, is establishing care. [name] EKG shows sinus bradycardia, slower than prior tracings. [name] cardiovascular health has been previously managed by Dr. Delton See, a cardiologist, who reported improved left ventricular function and normal strain. A coronary CT from 2020 showed zero calcium.  In addition to cardiovascular concerns, the patient recently suffered a left ankle fracture and underwent surgery. The patient was on Xarelto for DVT prophylaxis, managed by Dr. Toni Arthurs from orthopedic surgery. The patient has reported intolerances to brontolactone, which caused palpitations, and Entresto, which caused dizziness and vertigo.  Patient notes that she is doing well.   Since last visit notes went to a seminar for D. Amie Critchley.  Bought the book and did not buy the supplements. No chest pain or pressure .  No SOB/DOE and no PND/Orthopnea.  No weight gain or leg swelling.  No palpitations or syncope .  Would like to come off some medication.  Relevant histories: .  Social- married, former [employer] [facility] [employer] patient ROS: As per HPI.   Studies Reviewed: .   Cardiac Studies & Procedures     STRESS TESTS  EXERCISE TOLERANCE TEST (ETT) 08/14/2019  Narrative  Blood pressure demonstrated a normal response to exercise.  There was  no ST segment deviation noted during stress.  ETT with fair exercise tolerance (6:19); no chest pain; normal blood pressure response; no diagnostic ST changes; study terminated due to episode of 6 beats nonsustained, polymorphic ventricular tachycardia; patient achieved 83% target heart rate; negative inadequate exercise treadmill.   ECHOCARDIOGRAM  ECHOCARDIOGRAM COMPLETE 10/07/2020  Narrative ECHOCARDIOGRAM REPORT    Patient Name:   [name] Date of Exam: 10/07/2020 Medical Rec #:  [id number]         Height:       63.0 in Accession #:    [id number]        Weight:       155.8 lb Date of Birth:  1955-11-09         BSA:          1.739 m Patient Age:    65 years          BP:           128/76 mmHg Patient Gender: F                 HR:           53 bpm. Exam Location:  [address]  Procedure: 2D Echo, Cardiac Doppler, Color Doppler and Strain Analysis  Indications:    I50.22 Chronic systolic heart failure  History:        Patient has prior history of Echocardiogram examinations, most recent 12/07/2018. Palpitations. Nonischemic cardiomyopathy.  Sonographer:    Daphine Deutscher RDCS Referring Phys: [id number] Meriam Sprague  IMPRESSIONS   1. Left ventricular ejection fraction, by estimation, is 55 to 60%. The left ventricle has normal function. The left ventricle has no regional wall motion abnormalities. Left ventricular diastolic parameters are consistent with Grade I diastolic dysfunction (impaired relaxation). The average left ventricular global longitudinal strain is -21.5 %. The global longitudinal strain is normal. 2. Right ventricular systolic function is normal. The right ventricular size is normal. Tricuspid regurgitation signal is inadequate for assessing PA pressure. 3. The mitral valve is grossly normal. No evidence of mitral valve regurgitation. No evidence of mitral stenosis. 4. The aortic valve is tricuspid. Aortic valve regurgitation is trivial. No  aortic stenosis is present. 5. The inferior vena cava is normal in size with greater than 50% respiratory variability, suggesting right atrial pressure of 3 mmHg.  Comparison(s): A prior study was performed on 12/07/2018. Prior images reviewed side by side. Similar to slightly improved LV function; unable to compare strain (suboptimal 2020 acquisition).  FINDINGS Left Ventricle: Left ventricular ejection fraction, by estimation, is 55 to 60%. The left ventricle has normal function. The left ventricle has no regional wall motion abnormalities. The average left ventricular global longitudinal strain is -21.5 %. The global longitudinal strain is normal. The left ventricular internal cavity size was normal in size. There is no left ventricular hypertrophy. Left ventricular diastolic parameters are consistent with Grade I diastolic dysfunction (impaired relaxation).  Right Ventricle: The right ventricular size is normal. No increase in right ventricular wall thickness. Right ventricular systolic function is normal. Tricuspid regurgitation signal is inadequate for assessing PA pressure.  Left Atrium: Left atrial size was normal in size.  Right Atrium: Right atrial size was not well visualized.  Pericardium: There is no evidence of pericardial effusion.  Mitral Valve: The mitral valve is grossly normal. No evidence of mitral valve regurgitation. No evidence of mitral valve stenosis.  Tricuspid Valve: The tricuspid valve is normal in structure. Tricuspid valve regurgitation is not demonstrated. No evidence of tricuspid stenosis.  Aortic Valve: The aortic valve is tricuspid. Aortic valve regurgitation is trivial. No aortic stenosis is present.  Pulmonic Valve: The pulmonic valve was normal in structure. Pulmonic valve regurgitation is not visualized. No evidence of pulmonic stenosis.  Aorta: The aortic root, ascending aorta and aortic arch are all structurally normal, with no evidence of dilitation  or obstruction.  Venous: The inferior vena cava is normal in size with greater than 50% respiratory variability, suggesting right atrial pressure of 3 mmHg.  IAS/Shunts: The atrial septum is grossly normal.   LEFT VENTRICLE PLAX 2D LVIDd:         5.40 cm  Diastology LVIDs:         3.80 cm  LV e' medial:    5.92 cm/s LV PW:         0.80 cm  LV E/e' medial:  10.4 LV IVS:        0.60 cm  LV e' lateral:   7.98 cm/s LVOT diam:     1.90 cm  LV E/e' lateral: 7.7 LV SV:         42 LV SV Index:   24       2D Longitudinal Strain LVOT Area:     2.84 cm 2D Strain GLS (A2C):   -18.8 % 2D Strain GLS (A3C):   -24.4 % 2D Strain GLS (A4C):   -21.2 % 2D Strain GLS Avg:     -21.5 %  RIGHT VENTRICLE RV Basal diam:  3.90 cm RV  S prime:     14.85 cm/s TAPSE (M-mode): 2.2 cm  LEFT ATRIUM             Index       RIGHT ATRIUM           Index LA diam:        4.10 cm 2.36 cm/m  RA Area:     11.90 cm LA Vol (A2C):   47.8 ml 27.49 ml/m RA Volume:   29.00 ml  16.68 ml/m LA Vol (A4C):   38.2 ml 21.97 ml/m LA Biplane Vol: 42.7 ml 24.56 ml/m AORTIC VALVE LVOT Vmax:   61.80 cm/s LVOT Vmean:  41.450 cm/s LVOT VTI:    0.148 m  AORTA Ao Root diam: 2.90 cm Ao Asc diam:  2.90 cm  MITRAL VALVE MV Area (PHT): 3.42 cm    SHUNTS MV Decel Time: 222 msec    Systemic VTI:  0.15 m MV E velocity: 61.60 cm/s  Systemic Diam: 1.90 cm MV A velocity: 91.20 cm/s MV E/A ratio:  0.68  Riley Lam MD Electronically signed by Riley Lam MD Signature Date/Time: 10/07/2020/2:07:01 PM    Final    MONITORS  LONG TERM MONITOR-LIVE TELEMETRY (3-14 DAYS) 10/31/2018  Narrative Indication: palpitations; Cardiomyopathy EF 30-35%  Duration: 13d  Findings VT nonsustained x 3; longest 9 beats accelerating from 104 t 140 ; fastest   @ 150 X 4 b  Assoc with Symptoms SVT nonsustained  Up to 6 beats @ 200 bpm   Assoc with symptoms  Nocturnal bradycardia >> 45   Triggered Events-- as above  Her  bradycardia makes these hard to treat; in and of themselves they are not life threatening-- if she decides to consider an ICD then the bradycardia would be obviated  For now iwll just follow   CT SCANS  CT CORONARY MORPH W/CTA COR W/SCORE 08/22/2018  Addendum 08/23/2018  2:40 PM ADDENDUM REPORT: 08/23/2018 14:37  CLINICAL DATA:  67 year old female with cardiomyopathy of unknown etiology and ventricular tachycardia on a stress test.  EXAM: Cardiac/Coronary  CT  TECHNIQUE: The patient was scanned on a [employer].  FINDINGS: A 120 kV prospective scan was triggered in the descending thoracic aorta at 111 HU's. Axial non-contrast 3 mm slices were carried out through the heart. The data set was analyzed on a dedicated work station and scored using the Agatson method. Gantry rotation speed was 250 msecs and collimation was .6 mm. No beta blockade and 0.8 mg of sl NTG was given. The 3D data set was reconstructed in 5% intervals of the 67-82 % of the R-R cycle. Diastolic phases were analyzed on a dedicated work station using MPR, MIP and VRT modes. The patient received 80 cc of contrast.  Aorta:  Normal size.  No calcifications.  No dissection.  Aortic Valve:  Trileaflet.  No calcifications.  Coronary Arteries:  Normal coronary origin.  Right dominance.  RCA is a large dominant artery that gives rise to PDA and PLVB. There is no plaque.  Left main is a short artery that gives rise to LAD and LCX arteries. Left main has no plaque.  LAD is a large vessel that gives rise to one diagonal artery. There is no plaque.  LCX is a non-dominant artery that gives rise to one large OM1 branch. There is no plaque.  Other findings:  Normal pulmonary vein drainage into the left atrium.  Normal let atrial appendage without a thrombus.  Normal size of the pulmonary  artery.  IMPRESSION: 1. Coronary calcium score of 0. This was 0 percentile for age and sex matched  control.  2. Normal coronary origin with right dominance.  3. No evidence of CAD.   Electronically Signed By: Tobias Alexander On: 08/23/2018 14:37  Narrative EXAM: OVER-READ INTERPRETATION  CT CHEST  The following report is an over-read performed by radiologist Dr. Deberah Pelton [facility] on 08/22/2018. This over-read does not include interpretation of cardiac or coronary anatomy or pathology. The cardiac/coronary CTA interpretation by the cardiologist is attached.  COMPARISON:  None.  FINDINGS: The visualized portions of the lower lung fields show no suspicious nodules, masses, or infiltrates. The visualized portions of the mediastinum and chest wall are unremarkable.  IMPRESSION: No significant non-cardiac abnormality in visualized portion of the thorax.  Electronically Signed: By: Myles Rosenthal M.D. On: 08/22/2018 13:17          RADIOLOGY Calcium coronary CT: Zero calcium (2020)  DIAGNOSTIC EKG: Sinus bradycardia (01/14/2023) Echocardiography: Improved LV function, normal strain (2022)   Physical Exam:    VS:  BP 124/60   Pulse (!) 49   Ht 5\' 3"  (1.6 m)   Wt 152 lb 9.6 oz (69.2 kg)   SpO2 96%   BMI 27.03 kg/m    Wt Readings from Last 3 Encounters:  01/14/23 152 lb 9.6 oz (69.2 kg)  11/11/22 147 lb 0.8 oz (66.7 kg)  10/29/22 157 lb (71.2 kg)    Gen: no distress  Neck: No JVD Cardiac: No Rubs or Gallops, no murmur, RRR +2 radial pulses Respiratory: Clear to auscultation bilaterally, normal effort, normal  respiratory rate GI: Soft, nontender, non-distended  MS: No  edema; left leg in boot Integument: Skin feels warm Neuro:  At time of evaluation, alert and oriented to person/place/time/situation  Psych: Normal affect, patient feels well   ASSESSMENT AND PLAN: .    Hypertension - Well controlled on current medication. -Continue Benicar 10mg  PO daily.  Heart Failure with Recovered Ejection Fraction - Stable on current medication  regimen. Noted intolerance to spironolactone (palpitations) and Entresto (dizziness and vertigo). -Continue Coreg 6.25mg  PO daily and Olmesartan 10mg  PO daily.  Continue Inspra 25 mg Po daily - we have discussed alternative therapies and risk - we have discussed coming off therapy and follow up imaging - asymptomatic of bradycardia would trial off coreg first and repeat echo if she desires - NYHA I, Stage B will continue therapy  Hyperlipidemia No current lipid profile available. -Order lipid panel and LP little (a) - high barriers for intervention given patient preferences and zero calcium score  Recent Left Ankle Fracture Managed by Orthopedic Surgery with DVT prophylaxis. -No changes to current management plan.  One year follow up  Riley Lam, MD FASE [facility] Cardiologist [facility]  [address] [place], [address] [place], [place] [address] [phone]  9:03 AM

## 2023-01-14 NOTE — Patient Instructions (Signed)
Medication Instructions:  Your physician recommends that you continue on your current medications as directed. Please refer to the Current Medication list given to you today.  *If you need a refill on your cardiac medications before your next appointment, please call your pharmacy*   Lab Work: FLP, LPA  If you have labs (blood work) drawn today and your tests are completely normal, you will receive your results only by: MyChart Message (if you have MyChart) OR A paper copy in the mail If you have any lab test that is abnormal or we need to change your treatment, we will call you to review the results.   Testing/Procedures: NONE   Follow-Up: At [facility], you and your health needs are our priority.  As part of our continuing mission to provide you with exceptional heart care, we have created designated Provider Care Teams.  These Care Teams include your primary Cardiologist (physician) and Advanced Practice Providers (APPs -  Physician Assistants and Nurse Practitioners) who all work together to provide you with the care you need, when you need it.  We recommend signing up for the patient portal called "MyChart".  Sign up information is provided on this After Visit Summary.  MyChart is used to connect with patients for Virtual Visits (Telemedicine).  Patients are able to view lab/test results, encounter notes, upcoming appointments, etc.  Non-urgent messages can be sent to your provider as well.   To learn more about what you can do with MyChart, go to [email or web address].    Your next appointment:   1 year(s)  Provider:   Riley Lam, MD

## 2023-01-18 LAB — LIPID PANEL
Chol/HDL Ratio: 3.4 {ratio} (ref 0.0–4.4)
Cholesterol, Total: 226 mg/dL — ABNORMAL HIGH (ref 100–199)
HDL: 67 mg/dL (ref >39)
LDL Chol Calc (NIH): 140 mg/dL — ABNORMAL HIGH (ref 0–99)
Triglycerides: 106 mg/dL (ref 0–149)
VLDL Cholesterol Cal: 19 mg/dL (ref 5–40)

## 2023-01-18 LAB — LIPOPROTEIN A (LPA): Lipoprotein (a): 177.3 nmol/L — ABNORMAL HIGH (ref <75.0)

## 2023-01-21 ENCOUNTER — Telehealth (INDEPENDENT_AMBULATORY_CARE_PROVIDER_SITE_OTHER): Payer: Self-pay

## 2023-01-21 ENCOUNTER — Telehealth: Payer: Self-pay

## 2023-01-21 DIAGNOSIS — Z0000 Encounter for general adult medical examination without abnormal findings: Secondary | ICD-10-CM

## 2023-01-21 DIAGNOSIS — E782 Mixed hyperlipidemia: Secondary | ICD-10-CM

## 2023-01-21 NOTE — Telephone Encounter (Addendum)
Appointment Outcome: 01/21/2023 Completed, Session #: Initial                      Start time:12:31pm   End time: 12:58pm   Total Mins: 27 minutes  AGREEMENTS SECTION   Overall Goal(s): Improve health eating habits to reduce LDL over next three months to reduce the need for taking medication to lower her cholesterol                                              Agreement/Action Steps:  Research healthier food choices Reduce number of days per week eating at restaurants   Progress Notes:  Patient shared what her current eating habits are. Patient does eat out at restaurants with her husband frequently. Patient stated that they typically share a plate. Patient mentioned that most of her choices are salads, grilled chicken, occasionally grilled fish. Patient shared that she rarely eat red meat (ground beef in soup, or a few bites of steak) and if she does it is in small portions. Patient shared that she does not eat a lot of fried foods because it makes her bloat, but she may have a few [place] fries she share with her husband. Patient stated that she will eat one egg or sometimes split one egg with her husband for breakfast along with having a piece of toast with minimal butter, cinnamon, natural honey, and cayenne pepper. Patient mentioned that she does have some cheese or cottage cheese in her salads with vinaigrette dressing. Patient stated that if she has a heavy lunch then she may not be hungry during dinner time and will eat some dry cereal.   Patient expressed interest in getting back to exercising, but is currently in a boot. Patient stated that she has an appointment to get her boot removed soon and will start physical therapy.    Coaching Outcomes: Patient is interested in learning more about a low cholesterol diet and food choices first to see what additional changes that she can make. Discussed with patient ideas of reducing the number of days that she eats food from a restaurant by  meal planning and prepping at home. Patient will call to discuss participating in health coaching after receiving resources.   Informed the patient the an exercise physiologist will reach out to her about exercising and PREP as options for her to participate in.   Resources: Patient was emailed/mailed information that outlines what high cholesterol is, the recommended amount of cholesterol to consumer per day, how to read food labels, low cholesterol diet, and additional information that will aid in making healthy food choices.

## 2023-01-21 NOTE — Telephone Encounter (Signed)
-----   Message from Christell Constant sent at 01/19/2023  3:40 PM EDT ----- Results: LDL has worsened. Lp(A) is notably elevated Plan: Will offer guideline recommended heart attack prevention (would trial rosuvastatin 5 mg and lipids/ALT in 3 months)  Christell Constant, MD

## 2023-01-21 NOTE — Telephone Encounter (Signed)
The patient has been notified of the result and verbalized understanding.  All questions (if any) were answered. [name], RN 01/21/2023 9:08 AM    Pt reports does not want to take medication at this time.  Would like to work on diet and eventually exercise once boot to foot is taken off.  Pt has questions regarding diet advised pt will place a referral for wellness coach and repeat FLP on 04/24/22 to assess progress and revisit medication.

## 2023-01-25 ENCOUNTER — Telehealth (HOSPITAL_COMMUNITY): Payer: Self-pay | Admitting: *Deleted

## 2023-01-25 NOTE — Telephone Encounter (Signed)
Patient referred to Clinical Exercise Physiologist by CSW, Rosetta Posner, for guidance and discussion about safe home exercises and/or starting PREP.  Patient did not answer either contact numbers but LVM with call back to assess and provide guidance in joining local exercise programs.     Philip Aspen, MS, ACSM-RCEP Clinical Exercise Physiologist/ Health and Wellness Coach

## 2023-01-26 ENCOUNTER — Other Ambulatory Visit: Payer: Self-pay

## 2023-01-26 MED ORDER — EPLERENONE 25 MG PO TABS: 25.0000 mg | ORAL_TABLET | Freq: Every day | ORAL | 3 refills | Status: DC

## 2023-01-26 NOTE — Telephone Encounter (Signed)
Pt's pharmacy is requesting a refill on eplerenone 25 mg tablets. Would Dr. Izora Ribas like to refill this medication? Please address

## 2023-04-21 ENCOUNTER — Other Ambulatory Visit: Payer: Self-pay

## 2023-04-21 MED ORDER — CARVEDILOL 6.25 MG PO TABS: ORAL_TABLET | ORAL | 2 refills | Status: DC

## 2023-04-25 ENCOUNTER — Ambulatory Visit: Payer: BLUE CROSS/BLUE SHIELD | Attending: Internal Medicine

## 2023-04-25 DIAGNOSIS — E782 Mixed hyperlipidemia: Secondary | ICD-10-CM

## 2023-04-26 ENCOUNTER — Ambulatory Visit
Admission: RE | Admit: 2023-04-26 | Discharge: 2023-04-26 | Disposition: A | Discharge status: Home / Self Care | Payer: Medicare Other | Source: Ambulatory Visit | Attending: Gynecology | Admitting: Gynecology

## 2023-04-26 DIAGNOSIS — N6489 Other specified disorders of breast: Secondary | ICD-10-CM

## 2023-07-18 ENCOUNTER — Other Ambulatory Visit: Payer: Self-pay

## 2023-07-18 MED ORDER — BENICAR 20 MG PO TABS: 20.0000 mg | ORAL_TABLET | Freq: Every day | ORAL | 1 refills | Status: DC

## 2023-08-23 ENCOUNTER — Other Ambulatory Visit: Payer: Self-pay | Admitting: Internal Medicine

## 2023-10-26 ENCOUNTER — Telehealth: Payer: Self-pay | Admitting: Internal Medicine

## 2023-10-26 NOTE — Telephone Encounter (Signed)
 Patient c/o Palpitations:  STAT if patient reporting lightheadedness, shortness of breath, or chest pain  How long have you had palpitations/irregular HR/ Afib? Are you having the symptoms now? Not right now but has experienced palpitations over the last couple of weeks.  Are you currently experiencing lightheadedness, SOB or CP? no  Do you have a history of afib (atrial fibrillation) or irregular heart rhythm? palpitations  Have you checked your BP or HR? (document readings if available): BP at PCP yesterday was 130/70 and HR was normal  Are you experiencing any other symptoms? Nausea, diarrhea, bloating and diarrhea. She feels worse at night   Patient states that she went to her Family Doctor yesterday. She see's [facility] in [place], [place] but I can't find notes in Care Everywhere. She is scheduled to come in tomorrow to see Katlyn West, NP as she would like to eliminate any heart related issues accompanied with the symptoms she is having.

## 2023-10-26 NOTE — Telephone Encounter (Signed)
 Spoke with pt who reports she has scheduled appointment for evaluation due to recent palpitations.  Pt states she has had recent symptoms of acid reflux and diarrhea.  She was seen by her PCP yesterday 10/25/2023 and cultures and labs were completed but she has not yet received results. BP and HR normal yesterday according to pt. Pt is taking medications as prescribed. Pt has an appointment scheduled with Bridget West,NP 10/27/23.  Reviewed ED precautions.  Pt verbalizes understanding and agrees with current plan.

## 2023-10-26 NOTE — Progress Notes (Unsigned)
 Cardiology Office Note    Date:  10/27/2023  ID:  Kellsie, Grindle 1956-01-30, MRN [id number] PCP:  Fernand Tracey LABOR, MD  Cardiologist:  Stanly LABOR Leavens, MD  Electrophysiologist:  None   Chief Complaint: Epigastric discomfort  History of Present Illness: .   CHINITA SCHIMPF is a 68 y.o. female with visit-pertinent history of nonischemic cardiomyopathy with recovered EF and palpitations.  Patient has previously been followed by Dr. Maranda, then Dr. Hobart, she now sees Dr. Leavens.   On review of prior notes patient has history of NICM with negative stress test with initial LVEF 30 to 35% that improved to 50 to 55% in 2016 but then went back down to 30 to 35% and finally recovered to 55 to 60% in 11/2018.  A coronary CT in 2020 showed 0 calcium.  Patient was seen in clinic on 08/2020, she is doing well at that time with no heart failure symptoms TTE on 09/2020 indicated LVEF 50 to 60%, G1 DD, normal strain and no significant valve disease.  Patient with prior intolerance to Entresto  resulting in dizziness and vertigo also with spironolactone  which resulted in increased palpitations.   Patient was last seen in clinic by Dr. Leavens on 01/14/2023.  Patient reported that she was doing well at that time.  Patient discussed decreasing medication burden, noted to be bradycardic but asymptomatic at that time if patient wanted to pursue decreasing medications would trial coming off of carvedilol  first.  No medication changes were made at that time.  Today patient presents regarding increased epigastric discomfort.  Patient reports that for the last 2 weeks she has been having intermittent GI distress, with increased diarrhea, abdominal bloating and epigastric discomfort.  Patient reports that 2-3 nights ago she was having worsening bloating and a sense of pressure in the epigastric region of her chest.  Patient reports that she did take half a dose of Gas-X and Pepcid AC with some  minor improvement.  Patient reports that she saw her PCP yesterday and has had cultures collected and was started on an antibiotic 2 days ago with improvement in symptoms.  Patient reports that yesterday she started feeling more like herself and does not have any symptoms today.  Patient reports prior to this she was regularly exerting herself, caring for her dogs, carrying dog feed and working out in her yard carrying river stones.  She denied any chest pressure, pain or discomfort with exertion, denied any shortness of breath.  Patient did note possibly having some increased palpitations during her GI upset, feels as though her heart may have been beating faster.  Patient notes she is concerned given her history of heart failure.  However denies any lower extremity edema, orthopnea or PND.  ROS: .   Today denies chest pain, shortness of breath, lower extremity edema, fatigue, palpitations, melena, hematuria, hemoptysis, diaphoresis, weakness, presyncope, syncope, orthopnea, and PND.  All other systems are reviewed and otherwise negative. Studies Reviewed: SABRA   EKG:  EKG is ordered today, personally reviewed, demonstrating  EKG Interpretation Date/Time:  Thursday October 27 2023 08:39:03 EDT Ventricular Rate:  59 PR Interval:  126 QRS Duration:  86 QT Interval:  414 QTC Calculation: 409 R Axis:   30  Text Interpretation: Sinus bradycardia When compared with ECG of 14-Jan-2023 08:26, No significant change was found Confirmed by Jestina Stephani [phone] on 10/27/2023 9:06:03 AM   CV Studies: Cardiac studies reviewed are outlined and summarized above. Otherwise please see EMR for  full report. Cardiac Studies & Procedures   ______________________________________________________________________________________________   STRESS TESTS  EXERCISE TOLERANCE TEST (ETT) 08/14/2019  Interpretation Summary  Blood pressure demonstrated a normal response to exercise.  There was no ST segment deviation noted  during stress.  ETT with fair exercise tolerance (6:19); no chest pain; normal blood pressure response; no diagnostic ST changes; study terminated due to episode of 6 beats nonsustained, polymorphic ventricular tachycardia; patient achieved 83% target heart rate; negative inadequate exercise treadmill.   ECHOCARDIOGRAM  ECHOCARDIOGRAM COMPLETE 10/07/2020  Narrative ECHOCARDIOGRAM REPORT    Patient Name:   JODYE SCALI Date of Exam: 10/07/2020 Medical Rec #:  [id number]         Height:       63.0 in Accession #:    [id number]        Weight:       155.8 lb Date of Birth:  24-Dec-1955         BSA:          1.739 m Patient Age:    68 years          BP:           128/76 mmHg Patient Gender: F                 HR:           53 bpm. Exam Location:  [address]  Procedure: 2D Echo, Cardiac Doppler, Color Doppler and Strain Analysis  Indications:    I50.22 Chronic systolic heart failure  History:        Patient has prior history of Echocardiogram examinations, most recent 12/07/2018. Palpitations. Nonischemic cardiomyopathy.  Sonographer:    Carl Coma RDCS Referring Phys: [id number] HEATHER E [place]  IMPRESSIONS   1. Left ventricular ejection fraction, by estimation, is 55 to 60%. The left ventricle has normal function. The left ventricle has no regional wall motion abnormalities. Left ventricular diastolic parameters are consistent with Grade I diastolic dysfunction (impaired relaxation). The average left ventricular global longitudinal strain is -21.5 %. The global longitudinal strain is normal. 2. Right ventricular systolic function is normal. The right ventricular size is normal. Tricuspid regurgitation signal is inadequate for assessing PA pressure. 3. The mitral valve is grossly normal. No evidence of mitral valve regurgitation. No evidence of mitral stenosis. 4. The aortic valve is tricuspid. Aortic valve regurgitation is trivial. No aortic stenosis is present. 5.  The inferior vena cava is normal in size with greater than 50% respiratory variability, suggesting right atrial pressure of 3 mmHg.  Comparison(s): A prior study was performed on 12/07/2018. Prior images reviewed side by side. Similar to slightly improved LV function; unable to compare strain (suboptimal 2020 acquisition).  FINDINGS Left Ventricle: Left ventricular ejection fraction, by estimation, is 55 to 60%. The left ventricle has normal function. The left ventricle has no regional wall motion abnormalities. The average left ventricular global longitudinal strain is -21.5 %. The global longitudinal strain is normal. The left ventricular internal cavity size was normal in size. There is no left ventricular hypertrophy. Left ventricular diastolic parameters are consistent with Grade I diastolic dysfunction (impaired relaxation).  Right Ventricle: The right ventricular size is normal. No increase in right ventricular wall thickness. Right ventricular systolic function is normal. Tricuspid regurgitation signal is inadequate for assessing PA pressure.  Left Atrium: Left atrial size was normal in size.  Right Atrium: Right atrial size was not well visualized.  Pericardium: There is no evidence of pericardial effusion.  Mitral Valve: The mitral valve is grossly normal. No evidence of mitral valve regurgitation. No evidence of mitral valve stenosis.  Tricuspid Valve: The tricuspid valve is normal in structure. Tricuspid valve regurgitation is not demonstrated. No evidence of tricuspid stenosis.  Aortic Valve: The aortic valve is tricuspid. Aortic valve regurgitation is trivial. No aortic stenosis is present.  Pulmonic Valve: The pulmonic valve was normal in structure. Pulmonic valve regurgitation is not visualized. No evidence of pulmonic stenosis.  Aorta: The aortic root, ascending aorta and aortic arch are all structurally normal, with no evidence of dilitation or obstruction.  Venous: The  inferior vena cava is normal in size with greater than 50% respiratory variability, suggesting right atrial pressure of 3 mmHg.  IAS/Shunts: The atrial septum is grossly normal.   LEFT VENTRICLE PLAX 2D LVIDd:         5.40 cm  Diastology LVIDs:         3.80 cm  LV e' medial:    5.92 cm/s LV PW:         0.80 cm  LV E/e' medial:  10.4 LV IVS:        0.60 cm  LV e' lateral:   7.98 cm/s LVOT diam:     1.90 cm  LV E/e' lateral: 7.7 LV SV:         42 LV SV Index:   24       2D Longitudinal Strain LVOT Area:     2.84 cm 2D Strain GLS (A2C):   -18.8 % 2D Strain GLS (A3C):   -24.4 % 2D Strain GLS (A4C):   -21.2 % 2D Strain GLS Avg:     -21.5 %  RIGHT VENTRICLE RV Basal diam:  3.90 cm RV S prime:     14.85 cm/s TAPSE (M-mode): 2.2 cm  LEFT ATRIUM             Index       RIGHT ATRIUM           Index LA diam:        4.10 cm 2.36 cm/m  RA Area:     11.90 cm LA Vol (A2C):   47.8 ml 27.49 ml/m RA Volume:   29.00 ml  16.68 ml/m LA Vol (A4C):   38.2 ml 21.97 ml/m LA Biplane Vol: 42.7 ml 24.56 ml/m AORTIC VALVE LVOT Vmax:   61.80 cm/s LVOT Vmean:  41.450 cm/s LVOT VTI:    0.148 m  AORTA Ao Root diam: 2.90 cm Ao Asc diam:  2.90 cm  MITRAL VALVE MV Area (PHT): 3.42 cm    SHUNTS MV Decel Time: 222 msec    Systemic VTI:  0.15 m MV E velocity: 61.60 cm/s  Systemic Diam: 1.90 cm MV A velocity: 91.20 cm/s MV E/A ratio:  0.68  Stanly Leavens MD Electronically signed by Stanly Leavens MD Signature Date/Time: 10/07/2020/2:07:01 PM    Final    MONITORS  LONG TERM MONITOR-LIVE TELEMETRY (3-14 DAYS) 10/31/2018  Narrative Indication: palpitations; Cardiomyopathy EF 30-35%  Duration: 13d  Findings VT nonsustained x 3; longest 9 beats accelerating from 104 t 140 ; fastest   @ 150 X 4 b  Assoc with Symptoms SVT nonsustained  Up to 6 beats @ 200 bpm   Assoc with symptoms  Nocturnal bradycardia >> 45   Triggered Events-- as above  Her bradycardia makes these hard  to treat; in and of themselves they are not life threatening-- if she decides to consider an ICD then  the bradycardia would be obviated  For now iwll just follow   CT SCANS  CT CORONARY MORPH W/CTA COR W/SCORE 08/22/2018  Addendum 08/23/2018  2:40 PM ADDENDUM REPORT: 08/23/2018 14:37  CLINICAL DATA:  68 year old female with cardiomyopathy of unknown etiology and ventricular tachycardia on a stress test.  EXAM: Cardiac/Coronary  CT  TECHNIQUE: The patient was scanned on a [employer].  FINDINGS: A 120 kV prospective scan was triggered in the descending thoracic aorta at 111 HU's. Axial non-contrast 3 mm slices were carried out through the heart. The data set was analyzed on a dedicated work station and scored using the Agatson method. Gantry rotation speed was 250 msecs and collimation was .6 mm. No beta blockade and 0.8 mg of sl NTG was given. The 3D data set was reconstructed in 5% intervals of the 67-82 % of the R-R cycle. Diastolic phases were analyzed on a dedicated work station using MPR, MIP and VRT modes. The patient received 80 cc of contrast.  Aorta:  Normal size.  No calcifications.  No dissection.  Aortic Valve:  Trileaflet.  No calcifications.  Coronary Arteries:  Normal coronary origin.  Right dominance.  RCA is a large dominant artery that gives rise to PDA and PLVB. There is no plaque.  Left main is a short artery that gives rise to LAD and LCX arteries. Left main has no plaque.  LAD is a large vessel that gives rise to one diagonal artery. There is no plaque.  LCX is a non-dominant artery that gives rise to one large OM1 branch. There is no plaque.  Other findings:  Normal pulmonary vein drainage into the left atrium.  Normal let atrial appendage without a thrombus.  Normal size of the pulmonary artery.  IMPRESSION: 1. Coronary calcium score of 0. This was 0 percentile for age and sex matched control.  2. Normal coronary origin  with right dominance.  3. No evidence of CAD.   Electronically Signed By: Leim Moose On: 08/23/2018 14:37  Narrative EXAM: OVER-READ INTERPRETATION  CT CHEST  The following report is an over-read performed by radiologist Dr. Norleen Kill [facility] on 08/22/2018. This over-read does not include interpretation of cardiac or coronary anatomy or pathology. The cardiac/coronary CTA interpretation by the cardiologist is attached.  COMPARISON:  None.  FINDINGS: The visualized portions of the lower lung fields show no suspicious nodules, masses, or infiltrates. The visualized portions of the mediastinum and chest wall are unremarkable.  IMPRESSION: No significant non-cardiac abnormality in visualized portion of the thorax.  Electronically Signed: By: Norleen Kil M.D. On: 08/22/2018 13:17     ______________________________________________________________________________________________       Current Reported Medications:.    Current Meds  Medication Sig   BENICAR  20 MG tablet Take 1 tablet (20 mg total) by mouth daily.   carvedilol  (COREG ) 6.25 MG tablet TAKE 1 TABLET(6.25 MG) BY MOUTH TWICE DAILY   Cholecalciferol (VITAMIN D3) 125 MCG (5000 UT) CAPS Take 5,000 Units by mouth daily.   eplerenone  (INSPRA ) 25 MG tablet TAKE 1 TABLET BY MOUTH EVERY DAY   metroNIDAZOLE (FLAGYL) 500 MG tablet Take 500 mg by mouth 3 (three) times daily.   Physical Exam:    VS:  BP 128/68   Pulse (!) 59   Ht 5' 3 (1.6 m)   Wt 148 lb (67.1 kg)   SpO2 96%   BMI 26.22 kg/m    Wt Readings from Last 3 Encounters:  10/27/23 148 lb (67.1 kg)  01/14/23 152 lb 9.6 oz (69.2 kg)  11/11/22 147 lb 0.8 oz (66.7 kg)    GEN: Well nourished, well developed in no acute distress NECK: No JVD; No carotid bruits CARDIAC: RRR, no murmurs, rubs, gallops RESPIRATORY:  Clear to auscultation without rales, wheezing or rhonchi  ABDOMEN: Soft, non-tender, non-distended EXTREMITIES:  No  edema; No acute deformity     Asessement and Plan:.    Epigastric pressure/GI distress: Patient reports that for the last 2 weeks she has had increased episodes of diarrhea and bloating.  Patient reports that 2-3 nights ago she had epigastric pressure and increased acid reflux associated with increased abdominal bloating.  She reports she did take half a dose of Gas-X and some Pepcid with some minor improvement.  She was started on antibiotics 2 days ago with significant improvement in symptoms, denies any discomfort in the last 24 hours.  EKG today overall reassuring, prior to this was regularly exerting herself without any concerning anginal symptoms.  Encourage patient to continue monitoring her symptoms if recurrence outside of GI distress can consider repeating coronary CTA, prior in 2020 indicated a calcium score of 0.  Patient will notify the office of any recurrent symptoms.  Chronic combined diastolic and systolic heart failure with improved LVEF/nonischemic cardiomyopathy: Felt to possibly be viral etiology versus hereditary.  EF was previously as low as 30 to 35%, improved to 45 to 50% on TEE in 11/2018. Today she appears euvolemic and well compensated on exam.  Given her history and epigastric discomfort will check echocardiogram.  Continue olmesartan  20 mg daily, carvedilol  6.25 mg twice daily and eplerenone  25 mg daily.  Exercise-induced VT: Noted on prior ETT.  Repeat stress showed improvement with initiation of carvedilol .  EKG today shows sinus bradycardia at 59 bpm, no significant change compared to EKG in October, patient notes that during her episodes of GI discomfort thought as though her heart was beating faster.  Discussed that with acute illness this could potentially cause increased palpitations, also with increased diarrhea she could have had some electrolyte imbalances, encourage patient to stay well-hydrated.  She will continue to monitor for symptoms if she starts to note more  persistent palpitations will consider cardiac monitor.  Hypertension: Blood pressure today 120/68.  Continue current antihypertensive regimen.  Hyperlipidemia: Last lipid profile on 01/14/2023 indicated total cholesterol 226, HDL 67, LDL 140 and lipoprotein a 822.6.  It was recommended that patient start on rosuvastatin 5 mg daily for prevention, patient declined in favor of working on diet and exercise.   Disposition: F/u with Dr. Santo or Doyl Bitting, NP in three months or sooner if needed.   Signed, Gianella Chismar D Shmuel Girgis, NP

## 2023-10-27 ENCOUNTER — Ambulatory Visit: Attending: Cardiology | Admitting: Cardiology

## 2023-10-27 ENCOUNTER — Encounter: Payer: Self-pay | Admitting: Cardiology

## 2023-10-27 VITALS — BP 128/68 | HR 59 | Ht 63.0 in | Wt 148.0 lb

## 2023-10-27 DIAGNOSIS — R1013 Epigastric pain: Secondary | ICD-10-CM | POA: Insufficient documentation

## 2023-10-27 DIAGNOSIS — I5022 Chronic systolic (congestive) heart failure: Secondary | ICD-10-CM | POA: Insufficient documentation

## 2023-10-27 DIAGNOSIS — E782 Mixed hyperlipidemia: Secondary | ICD-10-CM | POA: Insufficient documentation

## 2023-10-27 DIAGNOSIS — I472 Ventricular tachycardia, unspecified: Secondary | ICD-10-CM | POA: Diagnosis present

## 2023-10-27 DIAGNOSIS — I10 Essential (primary) hypertension: Secondary | ICD-10-CM | POA: Insufficient documentation

## 2023-10-27 NOTE — Patient Instructions (Signed)
 Medication Instructions:  No changes *If you need a refill on your cardiac medications before your next appointment, please call your pharmacy*  Lab Work: No labs If you have labs (blood work) drawn today and your tests are completely normal, you will receive your results only by: MyChart Message (if you have MyChart) OR A paper copy in the mail If you have any lab test that is abnormal or we need to change your treatment, we will call you to review the results.  Testing/Procedures: Your physician has requested that you have an echocardiogram. Echocardiography is a painless test that uses sound waves to create images of your heart. It provides your doctor with information about the size and shape of your heart and how well your heart's chambers and valves are working. This procedure takes approximately one hour. There are no restrictions for this procedure. Please do NOT wear cologne, perfume, aftershave, or lotions (deodorant is allowed). Please arrive 15 minutes prior to your appointment time.  Please note: We ask at that you not bring children with you during ultrasound (echo/ vascular) testing. Due to room size and safety concerns, children are not allowed in the ultrasound rooms during exams. Our front office staff cannot provide observation of children in our lobby area while testing is being conducted. An adult accompanying a patient to their appointment will only be allowed in the ultrasound room at the discretion of the ultrasound technician under special circumstances. We apologize for any inconvenience.  Follow-Up: At [facility], you and your health needs are our priority.  As part of our continuing mission to provide you with exceptional heart care, our providers are all part of one team.  This team includes your primary Cardiologist (physician) and Advanced Practice Providers or APPs (Physician Assistants and Nurse Practitioners) who all work together to provide you with  the care you need, when you need it.  Your next appointment:   3 month(s)  Provider:   Stanly DELENA Leavens, MD    We recommend signing up for the patient portal called MyChart.  Sign up information is provided on this After Visit Summary.  MyChart is used to connect with patients for Virtual Visits (Telemedicine).  Patients are able to view lab/test results, encounter notes, upcoming appointments, etc.  Non-urgent messages can be sent to your provider as well.   To learn more about what you can do with MyChart, go to [email or web address].

## 2023-12-05 ENCOUNTER — Ambulatory Visit (HOSPITAL_COMMUNITY)
Admission: RE | Admit: 2023-12-05 | Discharge: 2023-12-05 | Disposition: A | Discharge status: Home / Self Care | Source: Ambulatory Visit | Attending: Cardiology | Admitting: Cardiology

## 2023-12-05 DIAGNOSIS — I5022 Chronic systolic (congestive) heart failure: Secondary | ICD-10-CM | POA: Insufficient documentation

## 2023-12-05 LAB — ECHOCARDIOGRAM COMPLETE
Est EF: 45
S' Lateral: 4.45 cm

## 2023-12-06 ENCOUNTER — Ambulatory Visit: Payer: Self-pay | Admitting: Cardiology

## 2023-12-07 NOTE — Telephone Encounter (Signed)
 Called patient advised of below they verbalized understanding Scheduled patient for f/u as they have questions about Jardiance before starting it.

## 2023-12-07 NOTE — Telephone Encounter (Signed)
-----   Message from Rosabel JONETTA Mose sent at 12/06/2023  1:20 PM EDT ----- Please let Ms. Giambra know that her echocardiogram indicates a slight reduction in her heart squeeze, EF 45%. There is mild stiffening of the left lower chamber of the heart, this is common with  aging and a history of hypertension. There is mild thickening of the aorta valve with mild to moderate leaking of the valve, we will monitor this on future echocardiograms. Given slight reduction in  EF would like to reconsider escalating GDMT with starting of Jardiance, this medication would not be ideal if she has any history of frequent UTIs or yeast infections, please confirm with patient.   Would also recommend checking BMET prior to starting medication. Pending lab results would consider starting Jardiance 10 mg daily if patient agreeable.  If she would prefer to discuss in clinic,  please set up a sooner follow-up appointment. ----- Message ----- From: Interface, [employer] Sent: 12/05/2023  10:56 AM EDT To: Katlyn D West, NP

## 2023-12-16 ENCOUNTER — Ambulatory Visit: Attending: Cardiology | Admitting: Cardiology

## 2023-12-16 ENCOUNTER — Ambulatory Visit

## 2023-12-16 VITALS — BP 110/64 | HR 67 | Ht 63.0 in | Wt 151.2 lb

## 2023-12-16 DIAGNOSIS — E782 Mixed hyperlipidemia: Secondary | ICD-10-CM

## 2023-12-16 DIAGNOSIS — I472 Ventricular tachycardia, unspecified: Secondary | ICD-10-CM | POA: Diagnosis not present

## 2023-12-16 DIAGNOSIS — I10 Essential (primary) hypertension: Secondary | ICD-10-CM | POA: Insufficient documentation

## 2023-12-16 DIAGNOSIS — R1013 Epigastric pain: Secondary | ICD-10-CM | POA: Diagnosis present

## 2023-12-16 DIAGNOSIS — R002 Palpitations: Secondary | ICD-10-CM | POA: Insufficient documentation

## 2023-12-16 DIAGNOSIS — I5022 Chronic systolic (congestive) heart failure: Secondary | ICD-10-CM | POA: Diagnosis not present

## 2023-12-16 NOTE — Patient Instructions (Addendum)
 Medication Instructions:  Your physician recommends that you continue on your current medications as directed. Please refer to the Current Medication list given to you today. *If you need a refill on your cardiac medications before your next appointment, please call your pharmacy*  Lab Work: TODAY:  BMET, CBC, & TSH  If you have labs (blood work) drawn today and your tests are completely normal, you will receive your results only by: MyChart Message (if you have MyChart) OR A paper copy in the mail If you have any lab test that is abnormal or we need to change your treatment, we will call you to review the results.  Testing/Procedures: GEOFFRY HEWS- Long Term Monitor Instructions  Your physician has requested you wear a ZIO patch monitor for 7 days.  This is a single patch monitor. [employer] supplies one patch monitor per enrollment. Additional stickers are not available. Please do not apply patch if you will be having a Nuclear Stress Test,  Echocardiogram, Cardiac CT, MRI, or Chest Xray during the period you would be wearing the  monitor. The patch cannot be worn during these tests. You cannot remove and re-apply the  ZIO XT patch monitor.  Your ZIO patch monitor will be mailed 3 day USPS to your address on file. It may take 3-5 days  to receive your monitor after you have been enrolled.  Once you have received your monitor, please review the enclosed instructions. Your monitor  has already been registered assigning a specific monitor serial # to you.  Billing and Patient Assistance Program Information  We have supplied [employer] with any of your insurance information on file for billing purposes. [employer] offers a sliding scale Patient Assistance Program for patients that do not have  insurance, or whose insurance does not completely cover the cost of the ZIO monitor.  You must apply for the Patient Assistance Program to qualify for this discounted rate.  To apply, please call [employer] at  [phone], select option 4, select option 2, ask to apply for  Patient Assistance Program. Meredeth will ask your household income, and how many people  are in your household. They will quote your out-of-pocket cost based on that information.  [employer] will also be able to set up a 57-month, interest-free payment plan if needed.  Applying the monitor   Shave hair from upper left chest.  Hold abrader disc by orange tab. Rub abrader in 40 strokes over the upper left chest as  indicated in your monitor instructions.  Clean area with 4 enclosed alcohol pads. Let dry.  Apply patch as indicated in monitor instructions. Patch will be placed under collarbone on left  side of chest with arrow pointing upward.  Rub patch adhesive wings for 2 minutes. Remove white label marked 1. Remove the white  label marked 2. Rub patch adhesive wings for 2 additional minutes.  While looking in a mirror, press and release button in center of patch. A small green light will  flash 3-4 times. This will be your only indicator that the monitor has been turned on.  Do not shower for the first 24 hours. You may shower after the first 24 hours.  Press the button if you feel a symptom. You will hear a small click. Record Date, Time and  Symptom in the Patient Logbook.  When you are ready to remove the patch, follow instructions on the last 2 pages of Patient  Logbook. Stick patch monitor onto the last page of Patient Logbook.  Place  Patient Logbook in the blue and white box. Use locking tab on box and tape box closed  securely. The blue and white box has prepaid postage on it. Please place it in the mailbox as  soon as possible. Your physician should have your test results approximately 7 days after the  monitor has been mailed back to [facility].  Call [facility] Customer Care at [phone] if you have questions regarding  your ZIO XT patch monitor. Call them immediately if you see an orange light  blinking on your  monitor.  If your monitor falls off in less than 4 days, contact our Monitor department at [phone].  If your monitor becomes loose or falls off after 4 days call [employer] at [phone] for  suggestions on securing your monitor   Follow-Up: At [facility], you and your health needs are our priority.  As part of our continuing mission to provide you with exceptional heart care, our providers are all part of one team.  This team includes your primary Cardiologist (physician) and Advanced Practice Providers or APPs (Physician Assistants and Nurse Practitioners) who all work together to provide you with the care you need, when you need it.  Your next appointment:   As scheduled   Provider:   Stanly DELENA Leavens, MD    We recommend signing up for the patient portal called MyChart.  Sign up information is provided on this After Visit Summary.  MyChart is used to connect with patients for Virtual Visits (Telemedicine).  Patients are able to view lab/test results, encounter notes, upcoming appointments, etc.  Non-urgent messages can be sent to your provider as well.   To learn more about what you can do with MyChart, go to [email or web address].   Other Instructions

## 2023-12-16 NOTE — Progress Notes (Addendum)
 Cardiology Office Note    Date:  12/18/2023  ID:  Bridget, [name] 1956-02-03, MRN [id number] PCP:  Fernand Tracey LABOR, MD  Cardiologist:  Bridget LABOR Leavens, MD  Electrophysiologist:  None   Chief Complaint: Follow up for HFrEF   History of Present Illness: .    [name] is a 68 y.o. female with visit-pertinent history of nonischemic cardiomyopathy with recovered EF and palpitations.  Patient has previously been followed by Dr. Maranda, then Dr. Hobart, she now sees Dr. Leavens.    On review of prior notes patient has history of NICM with negative stress test with initial LVEF 30 to 35% that improved to 50 to 55% in 2016 but then went back down to 30 to 35% and finally recovered to 55 to 60% in 11/2018.  A coronary CT in 2020 showed 0 calcium. Patient with history frequent PVCs, improved with carvedilol , also with hx of 6 beats of nonsustained ventricular tachycardia on ETT in 2021.    Patient was seen in clinic on 08/2020, she is doing well at that time with no heart failure symptoms TTE on 09/2020 indicated LVEF 50 to 60%, G1 DD, normal strain and no significant valve disease.  Patient with prior intolerance to Entresto  resulting in dizziness and vertigo also with spironolactone  which resulted in increased palpitations.    Patient was last seen in clinic by Dr. Leavens on 01/14/2023.  Patient reported that she was doing well at that time.  Patient discussed decreasing medication burden, noted to be bradycardic but asymptomatic at that time if patient wanted to pursue decreasing medications would trial coming off of carvedilol  first.  No medication changes were made at that time.  Patient was seen in clinic on 10/27/2023, she reported increased epigastric discomfort.  She reported that for the prior 2 weeks she been having intermittent GI distress with increased diarrhea, abdominal bloating and epigastric discomfort.  Patient had noted she had taken some medications with  some improvement.  She had also been started on antibiotic with further improvement in her symptoms.  She noted that she been regularly exerting herself prior to this and not had any chest pain or shortness of breath.  She did note that during her episodes of GI discomfort she felt as though her heart was beating faster, she agreed to continue monitoring symptoms and notify the office if persistent palpitations.  Echocardiogram was ordered given history of nonischemic cardiomyopathy.  Echocardiogram on 12/05/2023 indicated LVEF 45%, LV with global hypokinesis, internal cavity size was moderately dilated, G1 DD, RV systolic function and size was normal, trivial mitral valve regurgitation with no evidence of stenosis, aortic valve regurgitation was mild to moderate, aortic valve sclerosis present without evidence of stenosis.  It was recommended that she start on Jardiance 10 mg daily, patient deferred at that time favoring a an in person visit.  Today she presents for follow-up.  She reports that she has doing well. She reports that her GI illness has resolved, notes that she was not consistently taking her medications for a few weeks, likely no more than 3 weeks prior to her echocardiogram.  Patient reports that when she had her GI illness she had stopped taking her medications and forgot to resume.  She reports that she has now been can taking her medications consistently and tolerating well.  She denies any chest pain, shortness of breath, lower extremity edema, orthopnea or PND.  She does note palpitations that occur multiple times a week,  denies any sustained palpitations however feels it is a few increased skipped beats or a short period of her heart racing.  She denies any associated symptoms with her increased palpitations.  Patient notes concern with starting or changing any of her medications, notes that she has previously been unable to tolerate Entresto  and has tried multiple other medications with  side effects.  She notes that she was hesitant to start on Jardiance given increased risk for UTIs. ROS: .   Today she denies chest pain, shortness of breath, lower extremity edema, fatigue, melena, hematuria, hemoptysis, diaphoresis, weakness, presyncope, syncope, orthopnea, and PND.  All other systems are reviewed and otherwise negative. Studies Reviewed: [name]   EKG:  EKG is not ordered today.  CV Studies: Cardiac studies reviewed are outlined and summarized above. Otherwise please see EMR for full report. Cardiac Studies & Procedures   ______________________________________________________________________________________________   STRESS TESTS  EXERCISE TOLERANCE TEST (ETT) 08/14/2019  Interpretation Summary  Blood pressure demonstrated a normal response to exercise.  There was no ST segment deviation noted during stress.  ETT with fair exercise tolerance (6:19); no chest pain; normal blood pressure response; no diagnostic ST changes; study terminated due to episode of 6 beats nonsustained, polymorphic ventricular tachycardia; patient achieved 83% target heart rate; negative inadequate exercise treadmill.   ECHOCARDIOGRAM  ECHOCARDIOGRAM COMPLETE 12/05/2023  Narrative ECHOCARDIOGRAM REPORT    Patient Name:   [name] Date of Exam: 12/05/2023 Medical Rec #:  [id number]         Height:       63.0 in Accession #:    [id number]        Weight:       148.0 lb Date of Birth:  12-25-1955         BSA:          1.701 m Patient Age:    88 years          BP:           128/68 mmHg Patient Gender: F                 HR:           70 bpm. Exam Location:  [address]  Procedure: 2D Echo, 3D Echo and Strain Analysis (Both Spectral and Color Flow Doppler were utilized during procedure).  Indications:    I50.22 Chronic systolic (congestive) heart failure  History:        Patient has prior history of Echocardiogram examinations, most recent 10/07/2020. Risk Factors:Hypertension and  Dyslipidemia. NICM. Palpitations. Exercise-induced VT. EF<50%.  Sonographer:    [name] RCS Referring Phys: [id number] [name] D [name]  IMPRESSIONS   1. Left ventricular ejection fraction, by estimation, is 45%. The left ventricle has normal function. The left ventricle demonstrates global hypokinesis. The left ventricular internal cavity size was moderately dilated. Left ventricular diastolic parameters are consistent with Grade I diastolic dysfunction (impaired relaxation). The average left ventricular global longitudinal strain is -15.7 %. The global longitudinal strain is normal. 2. Right ventricular systolic function is normal. The right ventricular size is normal. 3. Left atrial size was mildly dilated. 4. The mitral valve is abnormal. Trivial mitral valve regurgitation. No evidence of mitral stenosis. 5. The aortic valve is tricuspid. There is mild calcification of the aortic valve. There is mild thickening of the aortic valve. Aortic valve regurgitation is mild to moderate. Aortic valve sclerosis is present, with no evidence of aortic valve stenosis. 6. The inferior vena cava  is normal in size with greater than 50% respiratory variability, suggesting right atrial pressure of 3 mmHg.  FINDINGS Left Ventricle: Left ventricular ejection fraction, by estimation, is 45%. The left ventricle has normal function. The left ventricle demonstrates global hypokinesis. The average left ventricular global longitudinal strain is -15.7 %. Strain was performed and the global longitudinal strain is normal. The left ventricular internal cavity size was moderately dilated. There is no left ventricular hypertrophy. Left ventricular diastolic parameters are consistent with Grade I diastolic dysfunction (impaired relaxation).  Right Ventricle: The right ventricular size is normal. No increase in right ventricular wall thickness. Right ventricular systolic function is normal.  Left Atrium: Left atrial  size was mildly dilated.  Right Atrium: Right atrial size was normal in size.  Pericardium: There is no evidence of pericardial effusion.  Mitral Valve: The mitral valve is abnormal. There is mild thickening of the mitral valve leaflet(s). Trivial mitral valve regurgitation. No evidence of mitral valve stenosis.  Tricuspid Valve: The tricuspid valve is normal in structure. Tricuspid valve regurgitation is mild . No evidence of tricuspid stenosis.  Aortic Valve: The aortic valve is tricuspid. There is mild calcification of the aortic valve. There is mild thickening of the aortic valve. Aortic valve regurgitation is mild to moderate. Aortic valve sclerosis is present, with no evidence of aortic valve stenosis.  Pulmonic Valve: The pulmonic valve was normal in structure. Pulmonic valve regurgitation is trivial. No evidence of pulmonic stenosis.  Aorta: The aortic root is normal in size and structure.  Venous: The inferior vena cava is normal in size with greater than 50% respiratory variability, suggesting right atrial pressure of 3 mmHg.  IAS/Shunts: No atrial level shunt detected by color flow Doppler.  Additional Comments: 3D was performed not requiring image post processing on an independent workstation and was abnormal.   LEFT VENTRICLE PLAX 2D LVIDd:         5.55 cm   Diastology LVIDs:         4.44 cm   LV e' medial:    4.46 cm/s LV PW:         1.08 cm   LV E/e' medial:  11.6 LV IVS:        0.87 cm   LV e' lateral:   4.79 cm/s LVOT diam:     2.00 cm   LV E/e' lateral: 10.8 LV SV:         60 LV SV Index:   35        2D Longitudinal Strain LVOT Area:     3.14 cm  2D Strain GLS (A4C):   -15.2 % 2D Strain GLS (A3C):   -18.3 % 2D Strain GLS (A2C):   -13.5 % 2D Strain GLS Avg:     -15.7 %  3D Volume EF: 3D EF:        45 % LV EDV:       174 ml LV ESV:       95 ml LV SV:        79 ml  RIGHT VENTRICLE RV Basal diam:  3.10 cm RV S prime:     18.50 cm/s TAPSE (M-mode): 2.1  cm  LEFT ATRIUM             Index        RIGHT ATRIUM           Index LA diam:        4.10 cm 2.41 cm/m   RA Area:  13.40 cm LA Vol (A2C):   51.7 ml 30.39 ml/m  RA Volume:   29.80 ml  17.52 ml/m LA Vol (A4C):   51.2 ml 30.09 ml/m LA Biplane Vol: 51.5 ml 30.27 ml/m AORTIC VALVE LVOT Vmax:   93.80 cm/s LVOT Vmean:  62.900 cm/s LVOT VTI:    0.192 m  AORTA Ao Root diam: 2.70 cm Ao Asc diam:  3.10 cm  MV E velocity: 51.90 cm/s MV A velocity: 102.00 cm/s  SHUNTS MV E/A ratio:  0.51         Systemic VTI:  0.19 m Systemic Diam: 2.00 cm  Maude Emmer MD Electronically signed by Maude Emmer MD Signature Date/Time: 12/05/2023/10:56:30 AM    Final    MONITORS  LONG TERM MONITOR-LIVE TELEMETRY (3-14 DAYS) 10/31/2018  Narrative Indication: palpitations; Cardiomyopathy EF 30-35%  Duration: 13d  Findings VT nonsustained x 3; longest 9 beats accelerating from 104 t 140 ; fastest   @ 150 X 4 b  Assoc with Symptoms SVT nonsustained  Up to 6 beats @ 200 bpm   Assoc with symptoms  Nocturnal bradycardia >> 45   Triggered Events-- as above  Her bradycardia makes these hard to treat; in and of themselves they are not life threatening-- if she decides to consider an ICD then the bradycardia would be obviated  For now iwll just follow   CT SCANS  CT CORONARY MORPH W/CTA COR W/SCORE 08/22/2018  Addendum 08/23/2018  2:40 PM ADDENDUM REPORT: 08/23/2018 14:37  CLINICAL DATA:  68 year old female with cardiomyopathy of unknown etiology and ventricular tachycardia on a stress test.  EXAM: Cardiac/Coronary  CT  TECHNIQUE: The patient was scanned on a [employer].  FINDINGS: A 120 kV prospective scan was triggered in the descending thoracic aorta at 111 HU's. Axial non-contrast 3 mm slices were carried out through the heart. The data set was analyzed on a dedicated work station and scored using the Agatson method. Gantry rotation speed was 250 msecs and  collimation was .6 mm. No beta blockade and 0.8 mg of sl NTG was given. The 3D data set was reconstructed in 5% intervals of the 67-82 % of the R-R cycle. Diastolic phases were analyzed on a dedicated work station using MPR, MIP and VRT modes. The patient received 80 cc of contrast.  Aorta:  Normal size.  No calcifications.  No dissection.  Aortic Valve:  Trileaflet.  No calcifications.  Coronary Arteries:  Normal coronary origin.  Right dominance.  RCA is a large dominant artery that gives rise to PDA and PLVB. There is no plaque.  Left main is a short artery that gives rise to LAD and LCX arteries. Left main has no plaque.  LAD is a large vessel that gives rise to one diagonal artery. There is no plaque.  LCX is a non-dominant artery that gives rise to one large OM1 branch. There is no plaque.  Other findings:  Normal pulmonary vein drainage into the left atrium.  Normal let atrial appendage without a thrombus.  Normal size of the pulmonary artery.  IMPRESSION: 1. Coronary calcium score of 0. This was 0 percentile for age and sex matched control.  2. Normal coronary origin with right dominance.  3. No evidence of CAD.   Electronically Signed By: Leim Moose On: 08/23/2018 14:37  Narrative EXAM: OVER-READ INTERPRETATION  CT CHEST  The following report is an over-read performed by radiologist Dr. Norleen Kill [facility] on 08/22/2018. This over-read does not include interpretation  of cardiac or coronary anatomy or pathology. The cardiac/coronary CTA interpretation by the cardiologist is attached.  COMPARISON:  None.  FINDINGS: The visualized portions of the lower lung fields show no suspicious nodules, masses, or infiltrates. The visualized portions of the mediastinum and chest wall are unremarkable.  IMPRESSION: No significant non-cardiac abnormality in visualized portion of the thorax.  Electronically Signed: By: Norleen Kil  M.D. On: 08/22/2018 13:17     ______________________________________________________________________________________________       Current Reported Medications:.    Current Meds  Medication Sig   BENICAR  20 MG tablet Take 1 tablet (20 mg total) by mouth daily.   carvedilol  (COREG ) 6.25 MG tablet TAKE 1 TABLET(6.25 MG) BY MOUTH TWICE DAILY   Cholecalciferol (VITAMIN D3) 125 MCG (5000 UT) CAPS Take 5,000 Units by mouth daily.   eplerenone  (INSPRA ) 25 MG tablet TAKE 1 TABLET BY MOUTH EVERY DAY   estradiol (ESTRACE) 0.1 MG/GM vaginal cream Place 1 Applicatorful vaginally at bedtime.   metroNIDAZOLE (FLAGYL) 500 MG tablet Take 500 mg by mouth 3 (three) times daily.   Physical Exam:    VS:  BP 110/64   Pulse 67   Ht 5' 3 (1.6 m)   Wt 151 lb 3.2 oz (68.6 kg)   SpO2 97%   BMI 26.78 kg/m    Wt Readings from Last 3 Encounters:  12/16/23 151 lb 3.2 oz (68.6 kg)  10/27/23 148 lb (67.1 kg)  01/14/23 152 lb 9.6 oz (69.2 kg)    GEN: Well nourished, well developed in no acute distress NECK: No JVD; No carotid bruits CARDIAC: RRR, no murmurs, rubs, gallops RESPIRATORY:  Clear to auscultation without rales, wheezing or rhonchi  ABDOMEN: Soft, non-tender, non-distended EXTREMITIES:  No edema; No acute deformity     Asessement and Plan:.    Chronic combined diastolic and systolic heart failure with improved LVEF/nonischemic cardiomyopathy: Felt to possibly be viral etiology versus hereditary.  EF was previously as low as 30 to 35%, improved to 45 to 50% on TEE in 11/2018.  Echo in 09/2020 indicated LVEF 55 to 60%, no wall motion abnormalities, diastolic parameters were consistent with G1 DD.  Echocardiogram on 12/05/2023 indicated LVEF 45%, LV with global hypokinesis, internal cavity size was moderately dilated, G1 DD, RV systolic function and size was normal, trivial mitral valve regurgitation with no evidence of stenosis, aortic valve regurgitation was mild to moderate, aortic valve sclerosis  present without evidence of stenosis.  Today she reports that she is doing well, she denies any chest pain or increased shortness of breath, lower extremity edema, orthopnea or PND.  She does note some occasional palpitations, see below.  Discussed escalation of GDMT however patient notes that she is hesitant to do this at this time given history of side effects with prior escalation of GDMT, she notes that she would prefer not to start on SGLT2 inhibitor at this time given increased risk for UTIs.  Discussed ischemic evaluation however patient denies any anginal symptoms and would prefer to pursue workup of increased palpitations at this time, consider repeating coronary CTA if cardiac monitor is unrevealing.  Reviewed ED precautions.  Continue carvedilol  6.25 mg twice daily, eplerenone  25 mg daily, olmesartan  20 mg daily.  Aortic valve regurgitation: Echo on 12/05/2023 indicated LVEF 45%, aortic valve regurgitation was mild to moderate.  Epigastric pressure/GI distress: At last office visit patient noted for that 2 weeks prior to visit she had increased episodes of diarrhea and bloating as well as epigastric pressure and  increased acid reflux associated with increased abdominal bloating, improvement with Pepcid.  Noted she was started on antibiotics with complete resolution in symptoms.  Denies any further symptoms.   Palpitations/exercise-induced VT: Patient with history frequent PVCs, improved with carvedilol , also with hx of 6 beats of nonsustained ventricular tachycardia on ETT in 2021. Patient reports that she has palpitations that can occur multiple times a week, consisting of a few episodes of skipped beats and intermittent increased heart rates, notes that these can last a few seconds to a few minutes.  She denies any dizziness, lightheadedness, presyncope or syncope.  Denies any chest pain or shortness of breath. Patient also with slightly reduced EF as noted above, given history of PVCs and  nonsustained VT on ETT in 2021, patient agreeable to wearing 1 week cardiac monitor. Reviewed ED precautions.  Check 1 week cardiac monitor.  Check CBC, BMET and TSH.   Hypertension: Blood pressure today 110/64.  Continue carvedilol  and Benicar .  Hyperlipidemia: Last lipid profile on 01/14/2023 indicated total cholesterol 226, HDL 67, LDL 140 and lipoprotein a 822.6.  It was recommended that patient start on rosuvastatin 5 mg daily for prevention, patient declined in favor of working on diet and exercise.   Disposition: F/u with Dr. Santo in 01/2024 as scheduled.   Signed, Sulema Braid D Cavan Bearden, NP

## 2023-12-16 NOTE — Progress Notes (Unsigned)
 Enrolled patient for a 7 day Zio XT monitor to be mailed to patients home  [name] to read

## 2023-12-17 LAB — CBC
Hematocrit: 38.5 % (ref 34.0–46.6)
Hemoglobin: 12.7 g/dL (ref 11.1–15.9)
MCH: 31.4 pg (ref 26.6–33.0)
MCHC: 33 g/dL (ref 31.5–35.7)
MCV: 95 fL (ref 79–97)
Platelets: 285 x10E3/uL (ref 150–450)
RBC: 4.05 x10E6/uL (ref 3.77–5.28)
RDW: 12.3 % (ref 11.7–15.4)
WBC: 6.8 x10E3/uL (ref 3.4–10.8)

## 2023-12-17 LAB — BASIC METABOLIC PANEL WITH GFR
BUN/Creatinine Ratio: 18 (ref 12–28)
BUN: 19 mg/dL (ref 8–27)
CO2: 22 mmol/L (ref 20–29)
Calcium: 9.5 mg/dL (ref 8.7–10.3)
Chloride: 106 mmol/L (ref 96–106)
Creatinine, Ser: 1.03 mg/dL — ABNORMAL HIGH (ref 0.57–1.00)
Glucose: 89 mg/dL (ref 70–99)
Potassium: 5 mmol/L (ref 3.5–5.2)
Sodium: 142 mmol/L (ref 134–144)
eGFR: 59 mL/min/1.73 — ABNORMAL LOW (ref >59)

## 2023-12-17 LAB — TSH: TSH: 2.15 u[IU]/mL (ref 0.450–4.500)

## 2023-12-18 ENCOUNTER — Ambulatory Visit: Payer: Self-pay | Admitting: Cardiology

## 2023-12-18 ENCOUNTER — Encounter: Payer: Self-pay | Admitting: Cardiology

## 2024-01-04 DIAGNOSIS — I10 Essential (primary) hypertension: Secondary | ICD-10-CM

## 2024-01-04 DIAGNOSIS — I5022 Chronic systolic (congestive) heart failure: Secondary | ICD-10-CM | POA: Diagnosis not present

## 2024-01-04 DIAGNOSIS — R1013 Epigastric pain: Secondary | ICD-10-CM | POA: Diagnosis not present

## 2024-01-04 DIAGNOSIS — E782 Mixed hyperlipidemia: Secondary | ICD-10-CM

## 2024-01-04 DIAGNOSIS — R002 Palpitations: Secondary | ICD-10-CM

## 2024-01-04 DIAGNOSIS — I472 Ventricular tachycardia, unspecified: Secondary | ICD-10-CM | POA: Diagnosis not present

## 2024-01-17 ENCOUNTER — Other Ambulatory Visit (HOSPITAL_BASED_OUTPATIENT_CLINIC_OR_DEPARTMENT_OTHER): Payer: Self-pay

## 2024-02-03 ENCOUNTER — Ambulatory Visit: Attending: Internal Medicine | Admitting: Internal Medicine

## 2024-02-03 VITALS — BP 150/64 | HR 64 | Ht 63.0 in | Wt 154.0 lb

## 2024-02-03 DIAGNOSIS — I5022 Chronic systolic (congestive) heart failure: Secondary | ICD-10-CM | POA: Insufficient documentation

## 2024-02-03 DIAGNOSIS — I351 Nonrheumatic aortic (valve) insufficiency: Secondary | ICD-10-CM | POA: Diagnosis present

## 2024-02-03 DIAGNOSIS — E782 Mixed hyperlipidemia: Secondary | ICD-10-CM | POA: Diagnosis present

## 2024-02-03 NOTE — Patient Instructions (Signed)
 Medication Instructions:  Your physician recommends that you continue on your current medications as directed. Please refer to the Current Medication list given to you today.  *If you need a refill on your cardiac medications before your next appointment, please call your pharmacy*  Lab Work: JAN 2026 - Fasting Lipid Panel at any [employer]  If you have labs (blood work) drawn today and your tests are completely normal, you will receive your results only by: MyChart Message (if you have MyChart) OR A paper copy in the mail If you have any lab test that is abnormal or we need to change your treatment, we will call you to review the results.  Testing/Procedures: AUG 2026 - - - Your physician has requested that you have a cardiac MRI. Cardiac MRI uses a computer to create images of your heart as its beating, producing both still and moving pictures of your heart and major blood vessels. For further information please visit [email or web address]. Please follow the instruction sheet given to you today for more information.   Follow-Up: At [facility], you and your health needs are our priority.  As part of our continuing mission to provide you with exceptional heart care, our providers are all part of one team.  This team includes your primary Cardiologist (physician) and Advanced Practice Providers or APPs (Physician Assistants and Nurse Practitioners) who all work together to provide you with the care you need, when you need it.  Your next appointment:   11 month(s)  Provider:   Stanly DELENA Leavens, MD     Other Instructions  You are scheduled for Cardiac MRI at the location below.  Please arrive for your appointment at ______________ . ?  [facility] [address] [place], [place] [address] Please take advantage of the free valet parking available at the [facility] [employer] (Entrance C).  Proceed to the [facility] Radiology Department (First Floor) for  check-in.   OR   [facility] [address] [place], [place] [address] Please go to the [facility] and check-in with the desk attendant.   Magnetic resonance imaging (MRI) is a painless test that produces images of the inside of the body without using Xrays.  During an MRI, strong magnets and radio waves work together in a Data processing manager to form detailed images.   MRI images may provide more details about a medical condition than X-rays, CT scans, and ultrasounds can provide.  You may be given earphones to listen for instructions.  You may eat a light breakfast and take medications as ordered with the exception of furosemide, hydrochlorothiazide, chlorthalidone or spironolactone  (or any other fluid pill). If you are undergoing a stress MRI, please avoid stimulants for 12 hr prior to test. (I.e. Caffeine, nicotine, chocolate, or antihistamine medications)  If your provider has ordered anti-anxiety medications for this test, then you will need a driver.  An IV will be inserted into one of your veins. Contrast material will be injected into your IV. It will leave your body through your urine within a day. You may be told to drink plenty of fluids to help flush the contrast material out of your system.  You will be asked to remove all metal, including: Watch, jewelry, and other metal objects including hearing aids, hair pieces and dentures. Also wearable glucose monitoring systems (ie. Freestyle Libre and Omnipods) (Braces and fillings normally are not a problem.)   TEST WILL TAKE APPROXIMATELY 1 HOUR  PLEASE  NOTIFY SCHEDULING AT LEAST 24 HOURS IN ADVANCE IF YOU ARE UNABLE TO KEEP YOUR APPOINTMENT. [phone]  For more information and frequently asked questions, please visit our website : [email or web address]  Please call the Cardiac Imaging Nurse Navigators with any questions/concerns. [phone] Office

## 2024-02-03 NOTE — Progress Notes (Signed)
 Cardiology Office Note:  .    Date:  02/03/2024  ID:  Raidyn, Breiner Jun 15, 1955, MRN [id number] PCP: Fernand Tracey LABOR, MD  [facility] Providers Cardiologist:  Stanly LABOR Leavens, MD     CC: F/u Heart failure  History of Present Illness: .    [name] is a 68 y.o. female with history NICM, and HLD who presents for follow up.       [name], a 67 year old patient with hypertension, heart failure, and hyperlipidemia who presents for a review of her lipids and LP lipids.  She has a history of hypertension, heart failure with reduced ejection fraction, and hyperlipidemia. Her ejection fraction was noted to be 45% with mild to moderate aortic regurgitation in August of this year. She has been seen by multiple providers including Dr. Maranda, Dr. Hobart, and Kaitlin West.  Her blood pressure has been elevated but was well controlled in July and September. She is interested in reducing her medication load.  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 She has been physically active, collecting and moving river stones, and walking with her grandson to help him manage his weight.  She has not had breakfast before the visit and is considering a stricter diet to manage her cholesterol levels, especially since her husband, Christopher, will need to follow a strict diet as well as he just had bypass  Relevant histories: .  Social- married, former [facility] patient; I see her husband Astrid) ROS: As per HPI.   Studies Reviewed: .   Cardiac Studies & Procedures   ______________________________________________________________________________________________   STRESS TESTS  EXERCISE TOLERANCE TEST (ETT) 08/14/2019  Interpretation Summary  Blood pressure demonstrated a normal response to exercise.  There was no ST segment deviation noted during stress.  ETT with fair exercise tolerance (6:19); no chest pain;  normal blood pressure response; no diagnostic ST changes; study terminated due to episode of 6 beats nonsustained, polymorphic ventricular tachycardia; patient achieved 83% target heart rate; negative inadequate exercise treadmill.   ECHOCARDIOGRAM  ECHOCARDIOGRAM COMPLETE 12/05/2023  Narrative ECHOCARDIOGRAM REPORT    Patient Name:   [name] Date of Exam: 12/05/2023 Medical Rec #:  [id number]         Height:       63.0 in Accession #:    [id number]        Weight:       148.0 lb Date of Birth:  19-Nov-1955         BSA:          1.701 m Patient Age:    43 years          BP:           128/68 mmHg Patient Gender: F                 HR:           70 bpm. Exam Location:  [address]  Procedure: 2D Echo, 3D Echo and Strain Analysis (Both Spectral and Color Flow Doppler were utilized during procedure).  Indications:    I50.22 Chronic systolic (congestive) heart failure  History:        Patient has prior history of Echocardiogram examinations, most recent 10/07/2020. Risk Factors:Hypertension and Dyslipidemia. NICM. Palpitations. Exercise-induced VT. EF<50%.  Sonographer:    Jon Hacker RCS Referring Phys: [id number] KATLYN D WEST  IMPRESSIONS   1. Left ventricular ejection  fraction, by estimation, is 45%. The left ventricle has normal function. The left ventricle demonstrates global hypokinesis. The left ventricular internal cavity size was moderately dilated. Left ventricular diastolic parameters are consistent with Grade I diastolic dysfunction (impaired relaxation). The average left ventricular global longitudinal strain is -15.7 %. The global longitudinal strain is normal. 2. Right ventricular systolic function is normal. The right ventricular size is normal. 3. Left atrial size was mildly dilated. 4. The mitral valve is abnormal. Trivial mitral valve regurgitation. No evidence of mitral stenosis. 5. The aortic valve is tricuspid. There is mild calcification of the aortic  valve. There is mild thickening of the aortic valve. Aortic valve regurgitation is mild to moderate. Aortic valve sclerosis is present, with no evidence of aortic valve stenosis. 6. The inferior vena cava is normal in size with greater than 50% respiratory variability, suggesting right atrial pressure of 3 mmHg.  FINDINGS Left Ventricle: Left ventricular ejection fraction, by estimation, is 45%. The left ventricle has normal function. The left ventricle demonstrates global hypokinesis. The average left ventricular global longitudinal strain is -15.7 %. Strain was performed and the global longitudinal strain is normal. The left ventricular internal cavity size was moderately dilated. There is no left ventricular hypertrophy. Left ventricular diastolic parameters are consistent with Grade I diastolic dysfunction (impaired relaxation).  Right Ventricle: The right ventricular size is normal. No increase in right ventricular wall thickness. Right ventricular systolic function is normal.  Left Atrium: Left atrial size was mildly dilated.  Right Atrium: Right atrial size was normal in size.  Pericardium: There is no evidence of pericardial effusion.  Mitral Valve: The mitral valve is abnormal. There is mild thickening of the mitral valve leaflet(s). Trivial mitral valve regurgitation. No evidence of mitral valve stenosis.  Tricuspid Valve: The tricuspid valve is normal in structure. Tricuspid valve regurgitation is mild . No evidence of tricuspid stenosis.  Aortic Valve: The aortic valve is tricuspid. There is mild calcification of the aortic valve. There is mild thickening of the aortic valve. Aortic valve regurgitation is mild to moderate. Aortic valve sclerosis is present, with no evidence of aortic valve stenosis.  Pulmonic Valve: The pulmonic valve was normal in structure. Pulmonic valve regurgitation is trivial. No evidence of pulmonic stenosis.  Aorta: The aortic root is normal in size and  structure.  Venous: The inferior vena cava is normal in size with greater than 50% respiratory variability, suggesting right atrial pressure of 3 mmHg.  IAS/Shunts: No atrial level shunt detected by color flow Doppler.  Additional Comments: 3D was performed not requiring image post processing on an independent workstation and was abnormal.   LEFT VENTRICLE PLAX 2D LVIDd:         5.55 cm   Diastology LVIDs:         4.44 cm   LV e' medial:    4.46 cm/s LV PW:         1.08 cm   LV E/e' medial:  11.6 LV IVS:        0.87 cm   LV e' lateral:   4.79 cm/s LVOT diam:     2.00 cm   LV E/e' lateral: 10.8 LV SV:         60 LV SV Index:   35        2D Longitudinal Strain LVOT Area:     3.14 cm  2D Strain GLS (A4C):   -15.2 % 2D Strain GLS (A3C):   -18.3 % 2D Strain GLS (  A2C):   -13.5 % 2D Strain GLS Avg:     -15.7 %  3D Volume EF: 3D EF:        45 % LV EDV:       174 ml LV ESV:       95 ml LV SV:        79 ml  RIGHT VENTRICLE RV Basal diam:  3.10 cm RV S prime:     18.50 cm/s TAPSE (M-mode): 2.1 cm  LEFT ATRIUM             Index        RIGHT ATRIUM           Index LA diam:        4.10 cm 2.41 cm/m   RA Area:     13.40 cm LA Vol (A2C):   51.7 ml 30.39 ml/m  RA Volume:   29.80 ml  17.52 ml/m LA Vol (A4C):   51.2 ml 30.09 ml/m LA Biplane Vol: 51.5 ml 30.27 ml/m AORTIC VALVE LVOT Vmax:   93.80 cm/s LVOT Vmean:  62.900 cm/s LVOT VTI:    0.192 m  AORTA Ao Root diam: 2.70 cm Ao Asc diam:  3.10 cm  MV E velocity: 51.90 cm/s MV A velocity: 102.00 cm/s  SHUNTS MV E/A ratio:  0.51         Systemic VTI:  0.19 m Systemic Diam: 2.00 cm  Maude Emmer MD Electronically signed by Maude Emmer MD Signature Date/Time: 12/05/2023/10:56:30 AM    Final    MONITORS  LONG TERM MONITOR (3-14 DAYS) 01/04/2024  Narrative   Patient had a minimum heart rate of 47 bpm, maximum heart rate of 169 bpm, and average heart rate of 70 bpm. Predominant underlying rhythm was sinus  rhythm. Several runs of paroxsymal SVT- 17 beats at longest.  Fastest 169 bpm. One run of NSVT 160 bpm. Isolated PACs were rare (<1.0%). Isolated PVCs were occasional (<1.0%). Triggered and diary events associated with sinus and SVT.  Sometimes symptomatic paroxsymal SVT.   CT SCANS  CT CORONARY MORPH W/CTA COR W/SCORE 08/22/2018  Addendum 08/23/2018  2:40 PM ADDENDUM REPORT: 08/23/2018 14:37  CLINICAL DATA:  68 year old female with cardiomyopathy of unknown etiology and ventricular tachycardia on a stress test.  EXAM: Cardiac/Coronary  CT  TECHNIQUE: The patient was scanned on a [employer].  FINDINGS: A 120 kV prospective scan was triggered in the descending thoracic aorta at 111 HU's. Axial non-contrast 3 mm slices were carried out through the heart. The data set was analyzed on a dedicated work station and scored using the Agatson method. Gantry rotation speed was 250 msecs and collimation was .6 mm. No beta blockade and 0.8 mg of sl NTG was given. The 3D data set was reconstructed in 5% intervals of the 67-82 % of the R-R cycle. Diastolic phases were analyzed on a dedicated work station using MPR, MIP and VRT modes. The patient received 80 cc of contrast.  Aorta:  Normal size.  No calcifications.  No dissection.  Aortic Valve:  Trileaflet.  No calcifications.  Coronary Arteries:  Normal coronary origin.  Right dominance.  RCA is a large dominant artery that gives rise to PDA and PLVB. There is no plaque.  Left main is a short artery that gives rise to LAD and LCX arteries. Left main has no plaque.  LAD is a large vessel that gives rise to one diagonal artery. There is no plaque.  LCX is a non-dominant  artery that gives rise to one large OM1 branch. There is no plaque.  Other findings:  Normal pulmonary vein drainage into the left atrium.  Normal let atrial appendage without a thrombus.  Normal size of the pulmonary artery.  IMPRESSION: 1.  Coronary calcium score of 0. This was 0 percentile for age and sex matched control.  2. Normal coronary origin with right dominance.  3. No evidence of CAD.   Electronically Signed By: Leim Moose On: 08/23/2018 14:37  Narrative EXAM: OVER-READ INTERPRETATION  CT CHEST  The following report is an over-read performed by radiologist Dr. Norleen Kill [facility] on 08/22/2018. This over-read does not include interpretation of cardiac or coronary anatomy or pathology. The cardiac/coronary CTA interpretation by the cardiologist is attached.  COMPARISON:  None.  FINDINGS: The visualized portions of the lower lung fields show no suspicious nodules, masses, or infiltrates. The visualized portions of the mediastinum and chest wall are unremarkable.  IMPRESSION: No significant non-cardiac abnormality in visualized portion of the thorax.  Electronically Signed: By: Norleen Kil M.D. On: 08/22/2018 13:17     ______________________________________________________________________________________________      Physical Exam:    VS:  BP (!) 150/64 (BP Location: Left Arm, Patient Position: Sitting, Cuff Size: Normal)   Pulse 64   Ht 5' 3 (1.6 m)   Wt 154 lb (69.9 kg)   SpO2 99%   BMI 27.28 kg/m    Wt Readings from Last 3 Encounters:  02/03/24 154 lb (69.9 kg)  12/16/23 151 lb 3.2 oz (68.6 kg)  10/27/23 148 lb (67.1 kg)    Gen: no distress  Neck: No JVD Cardiac: No Rubs or Gallops, holodiastolic murmur, RRR +2 radial pulses Respiratory: Clear to auscultation bilaterally, normal effort, normal  respiratory rate GI: Soft, nontender, non-distended  MS: No  edema; left leg in boot Integument: Skin feels warm Neuro:  At time of evaluation, alert and oriented to person/place/time/situation  Psych: Normal affect, patient feels well   ASSESSMENT AND PLAN: .    Aortic regurgitation, mild to moderate Mild to moderate aortic regurgitation with no severe  symptoms or heart failure symptoms currently present. - Order cardiac MRI in September 2026 to assess aortic regurgitation and cardiac function. - Monitor for new symptoms such as chest pain or heart failure symptoms.  Heart failure with mildly reduced ejection fraction - NYHA I, euvolemic, Stage C - Heart failure with an ejection fraction of 45% and mildly decreased heart function. - Monitor heart function and symptoms. - ARN and Spriro allergy - continue BB, MRA, and ARB - Offered SGLT2i (declined) - CMR in 2026  Hypertension Blood pressure is currently elevated but has been well controlled in previous readings. - Encourage lifestyle modifications and current therapy; her BP is elevated because her husband just had a CABG  Hyperlipidemia Elevated cholesterol levels with a preference for dietary modifications and increased physical activity before medication initiation. - Reassess cholesterol levels in January 2026. - Encourage dietary modifications and increased physical activity. - previously declined medication therapy (despite Lpa) will re-offer if no improvement.  September 2026 eval with me or Kaitlyn West  Longitudinal care: The evaluation and management services provided today reflect the complexity inherent in caring for this patient, including the ongoing longitudinal relationship and management of multiple chronic conditions and/or the need for care coordination. The visit required a comprehensive assessment and management plan tailored to the patient's unique needs Time was spent addressing not only the acute concerns but also the  broader context of the patient's health, including preventive care, chronic disease management, and care coordination as appropriate.  Complex longitudinal is necessary for conditions including: HF, AI, and HLD with strong preference against medical therapy   Stanly Leavens, MD FASE [facility] Cardiologist [facility]  [address] [place], [address] [place], [place] [address] [phone]  12:53 PM

## 2024-02-21 ENCOUNTER — Telehealth: Payer: Self-pay | Admitting: Internal Medicine

## 2024-02-21 NOTE — Telephone Encounter (Signed)
 Pt c/o medication issue:  1. Name of Medication:   BENICAR  20 MG tablet    2. How are you currently taking this medication (dosage and times per day)?    3. Are you having a reaction (difficulty breathing--STAT)? no  4. What is your medication issue? Patient needs a refill on this prescription, but the pharmacy doesn't have it in stock. Please advise

## 2024-02-21 NOTE — Telephone Encounter (Signed)
 Patient identification verified by 2 forms.   Called and spoke to patient  Patient states:  -Has called several pharmacies to see about Benicar    -I have tried several generic.   -Has 2 pills left   Patient denies:  -Being able to take generic Olmesartan .  -I can not take the Olmesartan  I HAVE to have the Benicar              Interventions/Plan: -Will get message to Dr. Santo for review.    Patient agrees with plan, no questions at this time

## 2024-02-22 MED ORDER — BENICAR 20 MG PO TABS: 20.0000 mg | ORAL_TABLET | Freq: Every day | ORAL | 2 refills | Status: AC

## 2024-02-22 NOTE — Telephone Encounter (Signed)
 Pt made aware Rx sent in.  Sent to [facility] as requested.

## 2024-03-26 NOTE — Addendum Note (Signed)
 Addended by: RANDY HAMP SAILOR on: 03/26/2024 04:37 PM   Modules accepted: Orders

## 2024-05-02 ENCOUNTER — Telehealth: Payer: Self-pay | Admitting: Internal Medicine

## 2024-05-02 NOTE — Telephone Encounter (Signed)
" °*  STAT* If patient is at the pharmacy, call can be transferred to refill team.   1. Which medications need to be refilled? (please list name of each medication and dose if known) eplerenone  (INSPRA ) 25 MG tablet  TAKE 1 TABLET BY MOUTH EVERY DAY   2. Would you like to learn more about the convenience, safety, & potential cost savings by using the [facility]? No  3. Are you open to using the [facility] No   4. Which pharmacy/location (including street and city if local pharmacy) is medication to be sent to? [id number] - [place], [place] - [address]   5. Do they need a 30 day or 90 day supply? 90 Day Supply "

## 2024-05-03 ENCOUNTER — Other Ambulatory Visit: Payer: Self-pay | Admitting: Internal Medicine

## 2024-05-03 NOTE — Telephone Encounter (Signed)
 Pt calling in to f/u. Please advise.   Pt is completely out.

## 2024-05-04 NOTE — Telephone Encounter (Signed)
 Pt calling to f/u on carvedilol  refill. Please advise.

## 2024-05-04 NOTE — Telephone Encounter (Signed)
 In accordance with refill protocols, please review and address the following requirements before this medication refill can be authorized:  Labs  Labs needed to be done within 365 days with normal levels. There are labs done, but not normal

## 2024-05-08 MED ORDER — EPLERENONE 25 MG PO TABS: 25.0000 mg | ORAL_TABLET | Freq: Every day | ORAL | 2 refills | Status: AC
